# Patient Record
Sex: Male | Born: 1950 | Race: White | Hispanic: No | Smoking: Former smoker
Health system: Southern US, Community
[De-identification: ages and names within clinical notes are randomized; demographics above are authoritative.]

## PROBLEM LIST (undated history)

## (undated) DIAGNOSIS — K8043 Calculus of bile duct with acute cholecystitis with obstruction: Secondary | ICD-10-CM

## (undated) DIAGNOSIS — R030 Elevated blood-pressure reading, without diagnosis of hypertension: Principal | ICD-10-CM

## (undated) DIAGNOSIS — E785 Hyperlipidemia, unspecified: Secondary | ICD-10-CM

## (undated) DIAGNOSIS — R3129 Other microscopic hematuria: Secondary | ICD-10-CM

## (undated) DIAGNOSIS — M069 Rheumatoid arthritis, unspecified: Secondary | ICD-10-CM

## (undated) DIAGNOSIS — S62326A Displaced fracture of shaft of fifth metacarpal bone, right hand, initial encounter for closed fracture: Secondary | ICD-10-CM

## (undated) DIAGNOSIS — S92401A Displaced unspecified fracture of right great toe, initial encounter for closed fracture: Secondary | ICD-10-CM

## (undated) DIAGNOSIS — E611 Iron deficiency: Secondary | ICD-10-CM

## (undated) DIAGNOSIS — J449 Chronic obstructive pulmonary disease, unspecified: Secondary | ICD-10-CM

## (undated) DIAGNOSIS — S92501A Displaced unspecified fracture of right lesser toe(s), initial encounter for closed fracture: Secondary | ICD-10-CM

## (undated) DIAGNOSIS — J189 Pneumonia, unspecified organism: Secondary | ICD-10-CM

## (undated) DIAGNOSIS — E538 Deficiency of other specified B group vitamins: Secondary | ICD-10-CM

## (undated) DIAGNOSIS — Z87898 Personal history of other specified conditions: Secondary | ICD-10-CM

## (undated) DIAGNOSIS — K219 Gastro-esophageal reflux disease without esophagitis: Secondary | ICD-10-CM

## (undated) HISTORY — DX: Hyperlipidemia, unspecified: E78.5

## (undated) HISTORY — DX: Iron deficiency: E61.1

## (undated) HISTORY — DX: Elevated blood-pressure reading, without diagnosis of hypertension: R03.0

## (undated) HISTORY — DX: Other microscopic hematuria: R31.29

## (undated) HISTORY — DX: Deficiency of other specified B group vitamins: E53.8

## (undated) HISTORY — DX: Calculus of bile duct with acute cholecystitis with obstruction: K80.43

## (undated) HISTORY — DX: Gilbert syndrome: E80.4

## (undated) HISTORY — DX: Personal history of other specified conditions: Z87.898

## (undated) HISTORY — DX: Chronic obstructive pulmonary disease, unspecified: J44.9

## (undated) HISTORY — PX: INGUINAL HERNIA REPAIR: SHX194

## (undated) HISTORY — DX: Rheumatoid arthritis, unspecified: M06.9

## (undated) HISTORY — DX: Gastro-esophageal reflux disease without esophagitis: K21.9

## (undated) HISTORY — DX: Displaced fracture of shaft of fifth metacarpal bone, right hand, initial encounter for closed fracture: S62.326A

## (undated) HISTORY — DX: Displaced unspecified fracture of right great toe, initial encounter for closed fracture: S92.401A

---

## 2004-12-12 ENCOUNTER — Emergency Department (HOSPITAL_COMMUNITY): Admission: EM | Admit: 2004-12-12 | Discharge: 2004-12-12 | Payer: Self-pay | Admitting: Emergency Medicine

## 2004-12-16 DIAGNOSIS — Z87898 Personal history of other specified conditions: Secondary | ICD-10-CM

## 2004-12-16 HISTORY — DX: Personal history of other specified conditions: Z87.898

## 2005-02-23 ENCOUNTER — Ambulatory Visit: Payer: Self-pay | Admitting: Internal Medicine

## 2005-05-25 ENCOUNTER — Ambulatory Visit: Payer: Self-pay | Admitting: Internal Medicine

## 2005-06-04 ENCOUNTER — Ambulatory Visit: Payer: Self-pay | Admitting: Internal Medicine

## 2005-11-23 ENCOUNTER — Ambulatory Visit: Payer: Self-pay | Admitting: Internal Medicine

## 2006-05-17 ENCOUNTER — Ambulatory Visit: Payer: Self-pay | Admitting: Internal Medicine

## 2006-05-17 LAB — CONVERTED CEMR LAB
AST: 20 units/L (ref 0–37)
Alkaline Phosphatase: 75 units/L (ref 39–117)
Basophils Absolute: 0 10*3/uL (ref 0.0–0.1)
CO2: 33 meq/L — ABNORMAL HIGH (ref 19–32)
Calcium: 9.2 mg/dL (ref 8.4–10.5)
Chloride: 105 meq/L (ref 96–112)
Chol/HDL Ratio, serum: 6.3
Creatinine, Ser: 1 mg/dL (ref 0.4–1.5)
Eosinophil percent: 2.7 % (ref 0.0–5.0)
HCT: 48 % (ref 39.0–52.0)
LDL DIRECT: 191.5 mg/dL
Lymphocytes Relative: 23.5 % (ref 12.0–46.0)
MCV: 88 fL (ref 78.0–100.0)
PSA: 0.91 ng/mL (ref 0.10–4.00)
Platelets: 188 10*3/uL (ref 150–400)
RBC: 5.45 M/uL (ref 4.22–5.81)
RDW: 11.6 % (ref 11.5–14.6)
Sodium: 144 meq/L (ref 135–145)

## 2006-05-24 ENCOUNTER — Ambulatory Visit: Payer: Self-pay | Admitting: Internal Medicine

## 2007-05-08 ENCOUNTER — Ambulatory Visit: Payer: Self-pay | Admitting: Internal Medicine

## 2007-05-08 LAB — CONVERTED CEMR LAB
ALT: 36 units/L (ref 0–53)
Albumin: 3.9 g/dL (ref 3.5–5.2)
BUN: 9 mg/dL (ref 6–23)
Basophils Relative: 0 % (ref 0.0–1.0)
CO2: 31 meq/L (ref 19–32)
Chloride: 106 meq/L (ref 96–112)
Cholesterol: 161 mg/dL (ref 0–200)
Eosinophils Relative: 2 % (ref 0.0–5.0)
GFR calc non Af Amer: 83 mL/min
HCT: 46.4 % (ref 39.0–52.0)
HDL: 32 mg/dL — ABNORMAL LOW (ref >39.0)
LDL Cholesterol: 111 mg/dL — ABNORMAL HIGH (ref 0–99)
Monocytes Relative: 7 % (ref 3.0–11.0)
Neutro Abs: 3.5 10*3/uL (ref 1.4–7.7)
Neutrophils Relative %: 65.6 % (ref 43.0–77.0)
PSA: 0.68 ng/mL (ref 0.10–4.00)
Potassium: 4.1 meq/L (ref 3.5–5.1)
RBC: 5.33 M/uL (ref 4.22–5.81)
Total Bilirubin: 1.2 mg/dL (ref 0.3–1.2)
Total Protein: 6.1 g/dL (ref 6.0–8.3)

## 2007-05-23 DIAGNOSIS — E785 Hyperlipidemia, unspecified: Secondary | ICD-10-CM | POA: Insufficient documentation

## 2007-05-29 ENCOUNTER — Ambulatory Visit: Payer: Self-pay | Admitting: Internal Medicine

## 2007-05-29 DIAGNOSIS — K219 Gastro-esophageal reflux disease without esophagitis: Secondary | ICD-10-CM | POA: Insufficient documentation

## 2007-12-30 ENCOUNTER — Ambulatory Visit: Payer: Self-pay | Admitting: Internal Medicine

## 2010-09-26 ENCOUNTER — Encounter: Payer: Self-pay | Admitting: Internal Medicine

## 2010-09-26 ENCOUNTER — Encounter: Payer: Self-pay | Admitting: Family

## 2010-09-26 ENCOUNTER — Ambulatory Visit (INDEPENDENT_AMBULATORY_CARE_PROVIDER_SITE_OTHER): Payer: BC Managed Care – PPO | Admitting: Family

## 2010-09-26 VITALS — BP 110/64 | HR 108 | Temp 99.0°F | Resp 18 | Ht 71.0 in | Wt 148.1 lb

## 2010-09-26 DIAGNOSIS — J329 Chronic sinusitis, unspecified: Secondary | ICD-10-CM

## 2010-09-26 MED ORDER — CEFUROXIME AXETIL 500 MG PO TABS: 500.0000 mg | ORAL_TABLET | Freq: Two times a day (BID) | ORAL | Status: AC

## 2010-09-26 MED ORDER — BENZONATATE 100 MG PO CAPS: 100.0000 mg | ORAL_CAPSULE | Freq: Four times a day (QID) | ORAL | Status: DC | PRN

## 2010-09-26 NOTE — Assessment & Plan Note (Addendum)
60 yr old male with sinusitis and probable bronchitis.  Will treat with flonase, ceftin and will change allegra D to plain Zyrtec due to mild tachycardia.  Pt instructed to call if symptoms worsen or do not improve.

## 2010-09-26 NOTE — Progress Notes (Signed)
  Subjective:    Patient ID: George Marshall, male    DOB: 1950/07/25, 60 y.o.   MRN: [id number]  HPI  George Marshall is a 60 year old male who presents today with chief complaint of congestion.  Symptoms started Friday night.  He went yesterday to [facility] and was told to take claritin D and flonase which have not helped him.  +nasal congestion which is clear.+ "pain behind my eyes."  Has not tried any otc neds except claritin and nyquil.  Difficulty sleeping due to chills/sweats.  +hoarsenss.  +productive cough- clear sputum.  Has not taken temp at home, but has felt feverish.  Denies sick contacts.  Non-smoker.      Review of Systems See HPI  Past Medical History  Diagnosis Date  . Hyperlipidemia   . GERD (gastroesophageal reflux disease)   . History of syncope 12/2004    History   Social History  . Marital Status: Divorced    Spouse Name: N/A    Number of Children: N/A  . Years of Education: N/A   Occupational History  . Not on file.   Social History Main Topics  . Smoking status: Former Smoker    Types: Cigarettes    Quit date: 06/18/2000  . Smokeless tobacco: Current User    Types: Chew  . Alcohol Use: Not on file  . Drug Use: Not on file  . Sexually Active: Not on file   Other Topics Concern  . Not on file   Social History Narrative  . No narrative on file    Past Surgical History  Procedure Date  . Inguinal hernia repair     age 89    Family History  Problem Relation Age of Onset  . Hypertension      parents late 42's    No Known Allergies   No current outpatient prescriptions on file prior to visit.    BP 110/64  Pulse 108  Temp(Src) 99 F (37.2 C) (Oral)  Resp 18  Ht 5\' 11"  (1.803 m)  Wt 148 lb 1.3 oz (67.169 kg)  BMI 20.65 kg/m2    Physical Exam  Constitutional: He appears well-developed and well-nourished.  HENT:  Head: Normocephalic and atraumatic.  Mouth/Throat: Oropharynx is clear and moist.       Bilateral cerumen impaction.     Eyes: Pupils are equal, round, and reactive to light.  Neck: Neck supple.  Cardiovascular: Regular rhythm.        Slightly increased heart rate.  Pulmonary/Chest: Effort normal and breath sounds normal.            Assessment & Plan:  .

## 2010-09-26 NOTE — Patient Instructions (Addendum)
Call if you develop fever over 101, if symptoms worsen, or if they are not improved in 48-72 hours.

## 2010-10-04 ENCOUNTER — Encounter: Payer: Self-pay | Admitting: Family

## 2010-10-17 ENCOUNTER — Emergency Department (INDEPENDENT_AMBULATORY_CARE_PROVIDER_SITE_OTHER): Payer: BC Managed Care – PPO

## 2010-10-17 ENCOUNTER — Encounter: Payer: Self-pay | Admitting: Family

## 2010-10-17 ENCOUNTER — Emergency Department (HOSPITAL_BASED_OUTPATIENT_CLINIC_OR_DEPARTMENT_OTHER)
Admission: EM | Admit: 2010-10-17 | Discharge: 2010-10-17 | Disposition: A | Discharge status: Home / Self Care | Payer: BC Managed Care – PPO | Attending: Emergency Medicine | Admitting: Emergency Medicine

## 2010-10-17 ENCOUNTER — Ambulatory Visit (INDEPENDENT_AMBULATORY_CARE_PROVIDER_SITE_OTHER): Payer: BC Managed Care – PPO | Admitting: Family

## 2010-10-17 VITALS — BP 92/62 | HR 118 | Temp 98.2°F | Resp 24 | Ht 71.0 in | Wt 136.1 lb

## 2010-10-17 DIAGNOSIS — J189 Pneumonia, unspecified organism: Secondary | ICD-10-CM

## 2010-10-17 DIAGNOSIS — E860 Dehydration: Secondary | ICD-10-CM

## 2010-10-17 DIAGNOSIS — R5381 Other malaise: Secondary | ICD-10-CM | POA: Insufficient documentation

## 2010-10-17 DIAGNOSIS — R634 Abnormal weight loss: Secondary | ICD-10-CM

## 2010-10-17 DIAGNOSIS — Z87891 Personal history of nicotine dependence: Secondary | ICD-10-CM

## 2010-10-17 DIAGNOSIS — R5383 Other fatigue: Secondary | ICD-10-CM | POA: Insufficient documentation

## 2010-10-17 DIAGNOSIS — K219 Gastro-esophageal reflux disease without esophagitis: Secondary | ICD-10-CM | POA: Insufficient documentation

## 2010-10-17 DIAGNOSIS — E785 Hyperlipidemia, unspecified: Secondary | ICD-10-CM | POA: Insufficient documentation

## 2010-10-17 DIAGNOSIS — M069 Rheumatoid arthritis, unspecified: Secondary | ICD-10-CM

## 2010-10-17 HISTORY — DX: Rheumatoid arthritis, unspecified: M06.9

## 2010-10-17 LAB — COMPREHENSIVE METABOLIC PANEL
ALT: 11 U/L (ref 0–53)
BUN: 12 mg/dL (ref 6–23)
CO2: 28 mEq/L (ref 19–32)
Calcium: 8.3 mg/dL — ABNORMAL LOW (ref 8.4–10.5)
Chloride: 97 mEq/L (ref 96–112)
Creatinine, Ser: 1 mg/dL (ref 0.4–1.5)
GFR calc Af Amer: >60 mL/min (ref >60)
Potassium: 3.5 mEq/L (ref 3.5–5.1)
Sodium: 138 mEq/L (ref 135–145)
Total Bilirubin: 1.7 mg/dL — ABNORMAL HIGH (ref 0.3–1.2)

## 2010-10-17 LAB — DIFFERENTIAL
Monocytes Absolute: 0.6 10*3/uL (ref 0.1–1.0)
Neutro Abs: 1.5 10*3/uL — ABNORMAL LOW (ref 1.7–7.7)

## 2010-10-17 LAB — CBC
HCT: 37.6 % — ABNORMAL LOW (ref 39.0–52.0)
MCV: 79.3 fL (ref 78.0–100.0)
RBC: 4.74 MIL/uL (ref 4.22–5.81)

## 2010-10-17 LAB — URINALYSIS, ROUTINE W REFLEX MICROSCOPIC
Bilirubin Urine: NEGATIVE
Glucose, UA: NEGATIVE mg/dL
Ketones, ur: 15 mg/dL — AB
Nitrite: NEGATIVE
Protein, ur: NEGATIVE mg/dL

## 2010-10-17 LAB — LIPASE, BLOOD: Lipase: 64 U/L (ref 23–300)

## 2010-10-17 MED ORDER — IOHEXOL 350 MG/ML SOLN
80.0000 mL | Freq: Once | INTRAVENOUS | Status: AC | PRN
  Administered 2010-10-17: 80 mL via INTRAVENOUS

## 2010-10-17 NOTE — Assessment & Plan Note (Signed)
Clinically dehydrated, ill appearing male with progressive weight loss and anorexia.  I am concerned for underlying malignancy given his smoking history.  He presents today alone, and is tachycardic and hypotensive.  Report provided to the charge nurse in the [facility] ED.  Patient will need basic laboratories, IV hydration and further evaluation as inpatient for suspected underlying malignancy.

## 2010-10-17 NOTE — Patient Instructions (Addendum)
Please go directly to the emergency room on the first floor.

## 2010-10-17 NOTE — Progress Notes (Signed)
Subjective:    Patient ID: George Marshall, male    DOB: 1950-10-28, 60 y.o.   MRN: [id number]  HPI  Mr.  Corpus is a 60 year old male who presents today for follow up. He was treated on 4/10 for  sinusitis and probable bronchitis with flonase, ceftin and zyrtec. He presents today with chief complaint of anorexia, weight loss and poor energy. He has lost 12 pounds in the last 3 weeks. He notes that he has been unable to eat or drink very much.  He notes + chills, fever a few days ago. + continued sinus congestion- blowing clear to white nasal discharge.  He notes mild cough, some chest congestion.  Notes some pain in his back near the scapula. Finished antibiotic- felt better initially though he did not get back to normal.   Review of Systems  Constitutional: Positive for fever, fatigue and unexpected weight change.  Respiratory: Positive for cough.   Cardiovascular: Negative for chest pain and leg swelling.  Hematological: Negative for adenopathy.   Past Medical History  Diagnosis Date  . Hyperlipidemia   . GERD (gastroesophageal reflux disease)   . History of syncope 12/2004    History   Social History  . Marital Status: Divorced    Spouse Name: N/A    Number of Children: N/A  . Years of Education: N/A   Occupational History  . Not on file.   Social History Main Topics  . Smoking status: Former Smoker    Types: Cigarettes    Quit date: 06/18/2000  . Smokeless tobacco: Current User    Types: Chew  . Alcohol Use: Not on file  . Drug Use: Not on file  . Sexually Active: Not on file   Other Topics Concern  . Not on file   Social History Narrative  . No narrative on file    Past Surgical History  Procedure Date  . Inguinal hernia repair     age 46    Family History  Problem Relation Age of Onset  . Hypertension      parents late 22's    No Known Allergies  Current Outpatient Prescriptions on File Prior to Visit  Medication Sig Dispense Refill  .  benzonatate (TESSALON PERLES) 100 MG capsule Take 1 capsule (100 mg total) by mouth every 6 (six) hours as needed for cough.  30 capsule  0  . fluticasone (FLONASE) 50 MCG/ACT nasal spray 2 sprays by Each Nare route daily.        . tadalafil (CIALIS) 20 MG tablet Take 20 mg by mouth at bedtime as needed.        . cetirizine (ZYRTEC) 10 MG tablet Take 10 mg by mouth daily.        . niacin (NIASPAN) 500 MG CR tablet Take 500 mg by mouth at bedtime.        . simvastatin (ZOCOR) 40 MG tablet Take 40 mg by mouth at bedtime.          BP 92/62  Pulse 118  Temp(Src) 98.2 F (36.8 C) (Oral)  Resp 24  Ht 5\' 11"  (1.803 m)  Wt 136 lb 1.9 oz (61.744 kg)  BMI 18.99 kg/m2  SpO2 100%       Objective:   Physical Exam  Constitutional:       Thin weak appearing white male seated on exam table.    HENT:  Head: Normocephalic and atraumatic.  Eyes: Conjunctivae are normal.  Neck: Neck supple.  Cardiovascular: Regular rhythm.  Tachycardia present.   Pulmonary/Chest:       Mild increased work of breathing is noted.  Diminished breath sounds throughout.  Abdominal: Soft. There is no tenderness.          Assessment & Plan:

## 2010-10-18 ENCOUNTER — Telehealth: Payer: Self-pay | Admitting: Family

## 2010-10-18 NOTE — Telephone Encounter (Signed)
Left message on home phone requesting call back to see how he is feeling.  Called work number- they told me that the patient no longer words there.

## 2010-10-19 NOTE — Telephone Encounter (Signed)
Pt returned my call and states he feels much better today. Advised pt he needed to see his PCP or [place] on Friday for further monitoring. Pt states he would like to return to our facility. Appt. Scheduled for 10/20/10 at 10:45.

## 2010-10-19 NOTE — Telephone Encounter (Signed)
Attempted to reach pt at home, no answer. Left message for pt to return my call.

## 2010-10-20 ENCOUNTER — Ambulatory Visit (INDEPENDENT_AMBULATORY_CARE_PROVIDER_SITE_OTHER): Payer: BC Managed Care – PPO | Admitting: Family

## 2010-10-20 ENCOUNTER — Emergency Department (HOSPITAL_BASED_OUTPATIENT_CLINIC_OR_DEPARTMENT_OTHER)
Admission: EM | Admit: 2010-10-20 | Discharge: 2010-10-20 | Disposition: A | Discharge status: Home / Self Care | Payer: BC Managed Care – PPO | Attending: Emergency Medicine | Admitting: Emergency Medicine

## 2010-10-20 ENCOUNTER — Encounter: Payer: Self-pay | Admitting: Family

## 2010-10-20 ENCOUNTER — Emergency Department (INDEPENDENT_AMBULATORY_CARE_PROVIDER_SITE_OTHER): Payer: BC Managed Care – PPO

## 2010-10-20 DIAGNOSIS — J189 Pneumonia, unspecified organism: Secondary | ICD-10-CM

## 2010-10-20 DIAGNOSIS — Z79899 Other long term (current) drug therapy: Secondary | ICD-10-CM | POA: Insufficient documentation

## 2010-10-20 DIAGNOSIS — E785 Hyperlipidemia, unspecified: Secondary | ICD-10-CM | POA: Insufficient documentation

## 2010-10-20 DIAGNOSIS — I959 Hypotension, unspecified: Secondary | ICD-10-CM | POA: Insufficient documentation

## 2010-10-20 DIAGNOSIS — E860 Dehydration: Secondary | ICD-10-CM

## 2010-10-20 DIAGNOSIS — E869 Volume depletion, unspecified: Secondary | ICD-10-CM | POA: Insufficient documentation

## 2010-10-20 DIAGNOSIS — K219 Gastro-esophageal reflux disease without esophagitis: Secondary | ICD-10-CM | POA: Insufficient documentation

## 2010-10-20 HISTORY — DX: Pneumonia, unspecified organism: J18.9

## 2010-10-20 LAB — CBC
MCHC: 34.5 g/dL (ref 30.0–36.0)
RBC: 4.78 MIL/uL (ref 4.22–5.81)
RDW: 13.7 % (ref 11.5–15.5)

## 2010-10-20 LAB — BASIC METABOLIC PANEL
BUN: 9 mg/dL (ref 6–23)
CO2: 28 mEq/L (ref 19–32)
Glucose, Bld: 110 mg/dL — ABNORMAL HIGH (ref 70–99)
Potassium: 3.2 mEq/L — ABNORMAL LOW (ref 3.5–5.1)

## 2010-10-20 LAB — DIFFERENTIAL
Monocytes Relative: 14 % — ABNORMAL HIGH (ref 3–12)
Neutrophils Relative %: 49 % (ref 43–77)

## 2010-10-20 NOTE — Progress Notes (Signed)
Subjective:    Patient ID: George Marshall, male    DOB: 1951-06-06, 60 y.o.   MRN: [id number]  HPI  Mr.  Dreibelbis is a 60 yr old male who presents today for ER follow up.  He was seen in the ED on Tuesday afternoon and diagnosed with pneumonia.  He initially underwent chest x-ray which was followed by a CT angio of the chest which noted multilobar right lung pneumonia with reactive right hilar and paratracheal lymphadenopathy. He was rehydrated and treated with avelox and ultimately discharged home with plans for close outpatient follow up. He reports that energy is "not very good."  Notes that he still has no appetite and has to "force feed myself."  He reports that he has been drinking lots of fluids.  Denies any fever at home.  Slight cough- dry.  Denies associated nausea, vomitting, or chest pain. + chest congestion and some associated chest tightness which "makes me want to cough. "    Review of Systems    see HPI  Past Medical History  Diagnosis Date  . Hyperlipidemia   . GERD (gastroesophageal reflux disease)   . History of syncope 12/2004    History   Social History  . Marital Status: Divorced    Spouse Name: N/A    Number of Children: N/A  . Years of Education: N/A   Occupational History  . Not on file.   Social History Main Topics  . Smoking status: Former Smoker    Types: Cigarettes    Quit date: 06/18/2000  . Smokeless tobacco: Current User    Types: Chew  . Alcohol Use: Not on file  . Drug Use: Not on file  . Sexually Active: Not on file   Other Topics Concern  . Not on file   Social History Narrative  . No narrative on file    Past Surgical History  Procedure Date  . Inguinal hernia repair     age 41    Family History  Problem Relation Age of Onset  . Hypertension      parents late 23's    No Known Allergies  Current Outpatient Prescriptions on File Prior to Visit  Medication Sig Dispense Refill  . fluticasone (FLONASE) 50 MCG/ACT nasal  spray 2 sprays by Each Nare route daily.        . tadalafil (CIALIS) 20 MG tablet Take 20 mg by mouth at bedtime as needed.        . niacin (NIASPAN) 500 MG CR tablet Take 500 mg by mouth at bedtime.        . simvastatin (ZOCOR) 40 MG tablet Take 40 mg by mouth at bedtime.        Marland Kitchen DISCONTD: benzonatate (TESSALON PERLES) 100 MG capsule Take 1 capsule (100 mg total) by mouth every 6 (six) hours as needed for cough.  30 capsule  0  . DISCONTD: cetirizine (ZYRTEC) 10 MG tablet Take 10 mg by mouth daily.          BP 86/64  Pulse 112  Temp(Src) 98 F (36.7 C) (Oral)  Resp 16  Ht 5\' 11"  (1.803 m)  Wt 135 lb 1.9 oz (61.29 kg)  BMI 18.85 kg/m2    Objective:   Physical Exam     genera:l frail appearing thin white male awake and alert he appears tired, but somewhat better than he appeared on Tuesday, May 1. ENT: Dry oral mucosa is noted, bilateral tympanic membranes partially occluded by cerumen Neck:  supple no JVD is noted. Cardiovascular: S1-S2, mild tachycardia is noted,regular rate. Respiratory: Patient continues to have diminished breath sounds throughout, no wheezes rales or rhonchi. No increased work of breathing. Abdomen soft nontender nondistended positive bowel sounds are noted Psychiatric patient is awake and alert and oriented x3.   Assessment & Plan:

## 2010-10-20 NOTE — Assessment & Plan Note (Signed)
60 year old male presents following 3 days of Avelox. Avelox treatment was preceded by a 10 day course of Ceftin for presumed bronchitis on 09/26/2010. He continues to have anorexia. Physical exam and vital signs indicate ongoing dehydration. In my opinion the patient needs inpatient treatment, however with his hypotension and lack of transportation will refer him through the emergency department for further evaluation, rather than directly admitting to hospital. I also continue to be concerned about possible underlying malignancy is contributing etiology to his pneumonia and pulmonary issues. Report was given to the charge nurse at the med center ED.

## 2010-10-20 NOTE — Assessment & Plan Note (Addendum)
BP Readings from Last 3 Encounters:  10/20/10 86/64  10/17/10 92/62  09/26/10 110/64   The patient's blood pressure today is even lower than it was on 10/17/2010 prior to his ER visit for IV hydration. Will refer back to ED for hydration and probable admission.

## 2010-10-24 ENCOUNTER — Telehealth: Payer: Self-pay | Admitting: Family

## 2010-10-24 NOTE — Telephone Encounter (Signed)
Called patient to follow up.  He reports that he is feeling better, but energy "still not back."  He was scheduled for a f/u apt on 5/11.

## 2010-10-26 ENCOUNTER — Encounter: Payer: Self-pay | Admitting: Family

## 2010-10-27 ENCOUNTER — Inpatient Hospital Stay (HOSPITAL_COMMUNITY): Payer: BC Managed Care – PPO

## 2010-10-27 ENCOUNTER — Ambulatory Visit (INDEPENDENT_AMBULATORY_CARE_PROVIDER_SITE_OTHER): Payer: BC Managed Care – PPO | Admitting: Family

## 2010-10-27 ENCOUNTER — Inpatient Hospital Stay (HOSPITAL_COMMUNITY)
Admission: AD | Admit: 2010-10-27 | Discharge: 2010-10-29 | DRG: 089 | Disposition: A | Discharge status: Home / Self Care | Payer: BC Managed Care – PPO | Source: Ambulatory Visit | Attending: Internal Medicine | Admitting: Internal Medicine

## 2010-10-27 ENCOUNTER — Encounter: Payer: Self-pay | Admitting: Family

## 2010-10-27 DIAGNOSIS — E871 Hypo-osmolality and hyponatremia: Secondary | ICD-10-CM | POA: Diagnosis present

## 2010-10-27 DIAGNOSIS — R630 Anorexia: Secondary | ICD-10-CM | POA: Diagnosis present

## 2010-10-27 DIAGNOSIS — I959 Hypotension, unspecified: Secondary | ICD-10-CM | POA: Diagnosis present

## 2010-10-27 DIAGNOSIS — D72819 Decreased white blood cell count, unspecified: Secondary | ICD-10-CM | POA: Diagnosis present

## 2010-10-27 DIAGNOSIS — E538 Deficiency of other specified B group vitamins: Secondary | ICD-10-CM | POA: Diagnosis present

## 2010-10-27 DIAGNOSIS — J189 Pneumonia, unspecified organism: Secondary | ICD-10-CM

## 2010-10-27 DIAGNOSIS — E860 Dehydration: Secondary | ICD-10-CM

## 2010-10-27 DIAGNOSIS — E876 Hypokalemia: Secondary | ICD-10-CM | POA: Diagnosis present

## 2010-10-27 LAB — URINALYSIS, ROUTINE W REFLEX MICROSCOPIC
Glucose, UA: NEGATIVE mg/dL
Ketones, ur: 15 mg/dL — AB
Specific Gravity, Urine: 1.019 (ref 1.005–1.030)
pH: 6 (ref 5.0–8.0)

## 2010-10-27 LAB — CBC
HCT: 34.3 % — ABNORMAL LOW (ref 39.0–52.0)
HCT: 31 % — ABNORMAL LOW (ref 39.0–52.0)
Hemoglobin: 10.4 g/dL — ABNORMAL LOW (ref 13.0–17.0)
MCV: 79.4 fL (ref 78.0–100.0)
MCV: 78.7 fL (ref 78.0–100.0)
Platelets: 188 10*3/uL (ref 150–400)
RBC: 3.94 MIL/uL — ABNORMAL LOW (ref 4.22–5.81)
RDW: 13.6 % (ref 11.5–15.5)
RDW: 13.6 % (ref 11.5–15.5)
WBC: 2.9 10*3/uL — ABNORMAL LOW (ref 4.0–10.5)
WBC: 2.9 10*3/uL — ABNORMAL LOW (ref 4.0–10.5)

## 2010-10-27 LAB — DIFFERENTIAL
Basophils Absolute: 0 10*3/uL (ref 0.0–0.1)
Eosinophils Relative: 0 % (ref 0–5)
Neutrophils Relative %: 46 % (ref 43–77)

## 2010-10-27 LAB — TSH: TSH: 0.534 u[IU]/mL (ref 0.350–4.500)

## 2010-10-27 LAB — VITAMIN B12: Vitamin B-12: 170 pg/mL — ABNORMAL LOW (ref 211–911)

## 2010-10-27 LAB — COMPREHENSIVE METABOLIC PANEL
AST: 42 U/L — ABNORMAL HIGH (ref 0–37)
Albumin: 2.2 g/dL — ABNORMAL LOW (ref 3.5–5.2)
Calcium: 8.6 mg/dL (ref 8.4–10.5)
Chloride: 91 mEq/L — ABNORMAL LOW (ref 96–112)
Potassium: 3.5 mEq/L (ref 3.5–5.1)
Total Bilirubin: 1.5 mg/dL — ABNORMAL HIGH (ref 0.3–1.2)

## 2010-10-27 LAB — IRON AND TIBC
Saturation Ratios: 15 % — ABNORMAL LOW (ref 20–55)
TIBC: 128 ug/dL — ABNORMAL LOW (ref 215–435)
UIBC: 109 ug/dL

## 2010-10-27 LAB — HEPATIC FUNCTION PANEL
ALT: 40 U/L (ref 0–53)
AST: 46 U/L — ABNORMAL HIGH (ref 0–37)
Albumin: 1.9 g/dL — ABNORMAL LOW (ref 3.5–5.2)
Alkaline Phosphatase: 185 U/L — ABNORMAL HIGH (ref 39–117)
Bilirubin, Direct: 0.4 mg/dL — ABNORMAL HIGH (ref 0.0–0.3)
Total Bilirubin: 1.1 mg/dL (ref 0.3–1.2)

## 2010-10-27 LAB — PROTIME-INR
INR: 1.2 (ref 0.00–1.49)
Prothrombin Time: 15.4 seconds — ABNORMAL HIGH (ref 11.6–15.2)

## 2010-10-27 LAB — LACTATE DEHYDROGENASE: LDH: 217 U/L (ref 94–250)

## 2010-10-27 NOTE — Assessment & Plan Note (Signed)
Pt with anorexia and dehydration in setting of pneumonia.  I have recommended inpatient evaluation at this point.

## 2010-10-27 NOTE — Patient Instructions (Signed)
Please go directly to [facility] Admissions department. Follow up after you are discharged from the hospital.

## 2010-10-27 NOTE — Progress Notes (Signed)
  Subjective:    Patient ID: George Marshall, male    DOB: [birth year], 60 y.o.   MRN: [id number]  HPI  [name] is a 60 yr old male who presents today for follow up of his pneumonia.  He was initially seen on May 1st and was sent to the ED with dehydration. He underwent a CTA at that time which noted a multilobar pneumonia with reactive right hilar and paratracheal LAD.  He was treated with Avelox IV fluids and sent home.  He returned to the office for f/u on 5/4 and was again noted to be hypotensive and dehydrated.  He was again referred to the ED. CXR performed at that time was unchanged.  He has two days left of avelox.  He has chief complaint of fatigue. Notes that he has some associated joint stiffness.  Denies fevers.  He does get some chills at night.  Drinking well, not eating   No appetite and no "taste buds".  Minimal cough, denies shortness of breath or chest pain.    Review of Systems  See HPI  Past Medical History  Diagnosis Date  . Hyperlipidemia   . GERD (gastroesophageal reflux disease)   . History of syncope 12/2004    History   Social History  . Marital Status: Divorced    Spouse Name: N/A    Number of Children: N/A  . Years of Education: N/A   Occupational History  . Not on file.   Social History Main Topics  . Smoking status: Former Smoker    Types: Cigarettes    Quit date: 06/18/2000  . Smokeless tobacco: Current User    Types: Chew  . Alcohol Use: Not on file  . Drug Use: Not on file  . Sexually Active: Not on file   Other Topics Concern  . Not on file   Social History Narrative  . No narrative on file    Past Surgical History  Procedure Date  . Inguinal hernia repair     age 31    Family History  Problem Relation Age of Onset  . Hypertension      parents late 41's  . Cancer Mother     breast and cervical    No Known Allergies  Current Outpatient Prescriptions on File Prior to Visit  Medication Sig Dispense Refill  . tadalafil  (CIALIS) 20 MG tablet Take 20 mg by mouth at bedtime as needed.        . fluticasone (FLONASE) 50 MCG/ACT nasal spray 2 sprays by Each Nare route daily.        . niacin (NIASPAN) 500 MG CR tablet Take 500 mg by mouth at bedtime.        . simvastatin (ZOCOR) 40 MG tablet Take 40 mg by mouth at bedtime.          BP 90/62  Pulse 127  Temp(Src) 98.3 F (36.8 C) (Oral)  Resp 18  Ht 5\' 11"  (1.803 m)  Wt 134 lb 1.3 oz (60.818 kg)  BMI 18.70 kg/m2  SpO2 98%        Objective:   Physical Exam Gen: cachectic appearing white male, appears weak and tired, but in NAD Head: [place]/AT CV:  S1/s2, +tachycardia Resp:  Bilateral expiratory wheeze is noted, no increased work of breathing. Ext: no peripheral edema Psych: A and O x 3, calm and pleasant.        Assessment & Plan:

## 2010-10-27 NOTE — Assessment & Plan Note (Addendum)
Clinically not improving, he is now wheezing and could benefit from the addition of steroids.  He continues Avelox- today is his 10th day of treatment and he is no complaining of arthralgias which could be a side effect.  I remain very concerned about the possibility of underlying malignancy as the primary issue in this patient.  Throughout this illness he has never presented with a fever or significant white count.  He will need a follow up chest CT and pulmonary consultation.  Report given to Dr. Venetia Constable- accepting physician at [facility].  Pt to be admitted for formal inpatient treatment and evaluation.

## 2010-10-28 ENCOUNTER — Inpatient Hospital Stay (HOSPITAL_COMMUNITY): Payer: BC Managed Care – PPO

## 2010-10-28 DIAGNOSIS — D649 Anemia, unspecified: Secondary | ICD-10-CM

## 2010-10-28 LAB — CBC
Hemoglobin: 9.4 g/dL — ABNORMAL LOW (ref 13.0–17.0)
MCH: 26.4 pg (ref 26.0–34.0)
MCHC: 33 g/dL (ref 30.0–36.0)
MCV: 80.1 fL (ref 78.0–100.0)
Platelets: 160 10*3/uL (ref 150–400)
RBC: 3.56 MIL/uL — ABNORMAL LOW (ref 4.22–5.81)

## 2010-10-28 LAB — HIV ANTIBODY (ROUTINE TESTING W REFLEX): HIV: NONREACTIVE

## 2010-10-28 LAB — POCT OCCULT BLOOD STOOL (DEVICE): Fecal Occult Bld: NEGATIVE

## 2010-10-28 LAB — BASIC METABOLIC PANEL
BUN: 10 mg/dL (ref 6–23)
CO2: 28 mEq/L (ref 19–32)
Calcium: 8 mg/dL — ABNORMAL LOW (ref 8.4–10.5)
Chloride: 103 mEq/L (ref 96–112)
Creatinine, Ser: 0.76 mg/dL (ref 0.4–1.5)
GFR calc Af Amer: >60 mL/min (ref >60)

## 2010-10-28 LAB — RETICULOCYTES: Retic Count, Absolute: 63.5 10*3/uL (ref 19.0–186.0)

## 2010-10-28 LAB — TSH: TSH: 0.958 u[IU]/mL (ref 0.350–4.500)

## 2010-10-28 LAB — RHEUMATOID FACTOR: Rhuematoid fact SerPl-aCnc: 213 IU/mL — ABNORMAL HIGH (ref <=14)

## 2010-10-28 MED ORDER — IOHEXOL 300 MG/ML  SOLN
100.0000 mL | Freq: Once | INTRAMUSCULAR | Status: AC | PRN
  Administered 2010-10-28: 100 mL via INTRAVENOUS

## 2010-10-29 LAB — DIFFERENTIAL
Basophils Absolute: 0 10*3/uL (ref 0.0–0.1)
Basophils Relative: 1 % (ref 0–1)
Eosinophils Absolute: 0 10*3/uL (ref 0.0–0.7)
Eosinophils Relative: 1 % (ref 0–5)
Monocytes Absolute: 0.4 10*3/uL (ref 0.1–1.0)
Monocytes Relative: 12 % (ref 3–12)

## 2010-10-29 LAB — LEGIONELLA ANTIGEN, URINE: Legionella Antigen, Urine: NEGATIVE

## 2010-10-29 LAB — URINE CULTURE

## 2010-10-29 LAB — CBC
Hemoglobin: 10.2 g/dL — ABNORMAL LOW (ref 13.0–17.0)
MCH: 26.7 pg (ref 26.0–34.0)
MCHC: 33.2 g/dL (ref 30.0–36.0)
RDW: 14 % (ref 11.5–15.5)

## 2010-10-29 LAB — HEPATITIS PANEL, ACUTE: Hep B C IgM: NEGATIVE

## 2010-10-29 NOTE — Discharge Summary (Signed)
NAME:  George, Marshall NO.:  [id number]  MEDICAL RECORD NO.:  [id number]           PATIENT TYPE:  I  LOCATION:  3020                         FACILITY:  [facility]  PHYSICIAN:  Conley Canal, MD      DATE OF BIRTH:  [birth year]  DATE OF ADMISSION:  10/27/2010 DATE OF DISCHARGE:  10/29/2010                        DISCHARGE SUMMARY - REFERRING   PRIMARY CARE PHYSICIAN:  George Craze, NP, with [facility].  CONSULTING PHYSICIANS:  George Marshall, M.D., FRCPC  DISCHARGE DIAGNOSES: 1. Multilobar community-acquired pneumonia failing outpatient     treatment. 2. Leukopenia, still being investigated possible causes including     acute reactionary leukopenia secondary to ongoing infection versus     other etiologies including rheumatoid arthritis versus bone marrow     deficiency. 3. Vitamin B12 deficiency with mixed picture of anemia. 4. Transaminitis.  PROCEDURES PERFORMED: 1. Chest x-ray on Oct 27, 2010, showed some improvement in right upper     lobe pneumonia. 2. CT of abdomen and pelvis on Oct 28, 2010, showed no acute findings     in the abdomen or pelvis, no evidence for neoplasm or     lymphadenopathy and it also showed lung bases which showed no     substantial change in the subpleural airspace disease in the right     lower lobe compared to recent CT of the chest on Oct 17, 2010.  HOSPITAL COURSE:  George Marshall is a very pleasant 60 year old male referred for direct admission by George Marshall, nurse practitioner, as he was failing outpatient treatment for pneumonia.  He had received more than 10 days of Avelox in the outpatient setting, but continued to have cough, poor appetite, and dehydration, hence referral for further management.  On the day of admission, his white count was noted to be 2.9, hemoglobin 11.1, hematocrit 34.3, MCV 79, and platelet count 188; and he was also hyponatremic, hypokalemic with sodium 131, potassium 3.5 with some  transaminitis.  Total bilirubin 1.5, alkaline phosphatase 201, AST 42, and ALT 43.  This prompted further workup including hepatitis panel which was normal as well as sedimentation rate which was 104.  The anemia prompted an anemia panel which showed ferritin of 740, folate 6.3, vitamin B12 of 1700.  Blood cultures were negative x2.  The patient was seen by Dr. Donnie Marshall for anemia, leukopenia.  His thought was this was most likely reactive leukopenia.  He also ordered tests including a GGT which was elevated at 101.  Peripheral smear showing polychromasia, reticulocyte count which was unremarkable.  The patient's rheumatoid factor was elevated at 213.  HIV antibody was negative.  Haptoglobin was slightly elevated at 238.  At the time of discharge, the patient still complained of generalized joint pain, which he said started the same time he was first diagnosed of pneumonia.  Given the elevated rheumatoid factor, this would raise concern for rheumatoid arthritis, although rheumatoid factor can be quite nonspecific, so confirmatory tests including anti-citrullinated antibodies were sent prior to discharge. The elevated sedimentation rate and ferritin could just be pointing to acute ongoing inflammation, but consideration should be given for hemochromatosis  if these remain elevated after treatment.  Regarding the pneumonia, the patient was initially placed on Zosyn and vancomycin, then switched to ceftriaxone and azithromycin.  He remained afebrile and prior to discharge his white count was 3.1 with a hemoglobin of 10.2 and hematocrit 30.7.  He will be discharged to complete 4 more days of Keflex.  I placed him on a short course of prednisone for possible rheumatoid arthritis.  If in fact he has rheumatoid arthritis, he will need referral to Rheumatology for further management.  I gave him [facility] for [facility] followup in case he will require bone marrow biopsy. He  was started on vitamin B12 injections to complete 5 more days at home and subsequently monthly intramuscular injections.  He is otherwise discharged in a stable condition.  The time spent for this discharge preparation is less than 30 minutes.     Conley Canal, MD     SR/MEDQ  D:  10/29/2010  T:  10/29/2010  Job:  [id number]  cc:   George Craze, NP George Marshall, M.D., F.R.C.P.C.  Electronically Signed by Conley Canal  on 10/29/2010 10:49:58 AM

## 2010-10-30 ENCOUNTER — Telehealth: Payer: Self-pay | Admitting: *Deleted

## 2010-10-30 ENCOUNTER — Ambulatory Visit (INDEPENDENT_AMBULATORY_CARE_PROVIDER_SITE_OTHER): Payer: BC Managed Care – PPO | Admitting: Family

## 2010-10-30 DIAGNOSIS — E538 Deficiency of other specified B group vitamins: Secondary | ICD-10-CM

## 2010-10-30 LAB — CYCLIC CITRUL PEPTIDE ANTIBODY, IGG: Cyclic Citrullin Peptide Ab: >300 U/mL — ABNORMAL HIGH (ref 0.0–5.0)

## 2010-10-30 MED ORDER — CYANOCOBALAMIN 1000 MCG/ML IJ SOLN
1000.0000 ug | Freq: Once | INTRAMUSCULAR | Status: AC
  Administered 2010-10-30: 1000 ug via INTRAMUSCULAR

## 2010-10-30 NOTE — Telephone Encounter (Signed)
Pt presented to the office today for a B12 injection per hospital order at discharge. Pt wanted to know when he should f/u with [place] from his hospital stay and when should he get the next B12 inj. Per Sandford Craze, NP pt should f/u with [place] in 1 wk and next B12 inj should be in 1 month; will decide at that time how long injections should continue. Left message on machine for pt to return my call.

## 2010-11-01 NOTE — Telephone Encounter (Signed)
Patient came in and I scheduled him for his follow up and b 12

## 2010-11-02 ENCOUNTER — Encounter: Payer: Self-pay | Admitting: Family

## 2010-11-02 LAB — CULTURE, BLOOD (ROUTINE X 2)
Culture  Setup Time: 201205111831
Culture: NO GROWTH
Culture: NO GROWTH

## 2010-11-03 NOTE — Assessment & Plan Note (Signed)
[facility]                            [facility] OFFICE NOTE   NAME:George Marshall, George Marshall                      MRN:          [id number]  DATE:05/24/2006                            DOB:          08-04-1950    This is a 60 year old gentleman seen today for a wellness exam. He has  hypercholesterolemia. He is doing quite well. He has stopped both his  niacin and Lipitor a couple of months ago. Screening laboratory data  revealed an LDL of 191 and a total cholesterol of 247. He has a history  of vasovagal syncope in the past. He stopped the medication due to some  episodes of palpitation and flushing.  Past medical history is otherwise  fairly unremarkable. In 2006, he had a negative Cardiolite stress test  and echocardiogram. He had a hernia repaired at age 62.   FAMILY HISTORY:  Unchanged. Both parents remain well.   PHYSICAL EXAMINATION:  GENERAL: Revealed a fair complected male in no  acute distress.  VITAL SIGNS: Blood pressure was normal.  HEENT: Clear.  NECK: No bruits.  CHEST: Clear.  CARDIOVASCULAR: Normal heart sounds, no murmurs.  ABDOMEN: Benign.  EXTERNAL GENITALIA: Normal.  RECTAL: Prostate minimally enlarged.  Stool heme negative.  EXTREMITIES: Negative, full peripheral pulses.   IMPRESSION:  Dyslipidemia.   DISPOSITION:  Statin therapy will be resumed. Compliance stressed.  Niacin will be discontinued. We will change to a 20 mg dose. We will  recheck in one year or P.R.N.     Gordy Savers, MD  Electronically Signed    PFK/MedQ  DD: 05/24/2006  DT: 05/25/2006  Job #: [id number]

## 2010-11-06 ENCOUNTER — Encounter: Payer: Self-pay | Admitting: Family

## 2010-11-06 ENCOUNTER — Ambulatory Visit (INDEPENDENT_AMBULATORY_CARE_PROVIDER_SITE_OTHER): Payer: BC Managed Care – PPO | Admitting: Family

## 2010-11-06 ENCOUNTER — Ambulatory Visit (HOSPITAL_BASED_OUTPATIENT_CLINIC_OR_DEPARTMENT_OTHER)
Admission: RE | Admit: 2010-11-06 | Discharge: 2010-11-06 | Disposition: A | Discharge status: Home / Self Care | Payer: BC Managed Care – PPO | Source: Ambulatory Visit | Attending: Family | Admitting: Family

## 2010-11-06 VITALS — BP 98/60 | HR 112 | Temp 97.8°F | Resp 20 | Wt 134.0 lb

## 2010-11-06 DIAGNOSIS — E611 Iron deficiency: Secondary | ICD-10-CM

## 2010-11-06 DIAGNOSIS — R059 Cough, unspecified: Secondary | ICD-10-CM

## 2010-11-06 DIAGNOSIS — J189 Pneumonia, unspecified organism: Secondary | ICD-10-CM

## 2010-11-06 DIAGNOSIS — R599 Enlarged lymph nodes, unspecified: Secondary | ICD-10-CM | POA: Insufficient documentation

## 2010-11-06 DIAGNOSIS — E538 Deficiency of other specified B group vitamins: Secondary | ICD-10-CM

## 2010-11-06 DIAGNOSIS — R05 Cough: Secondary | ICD-10-CM

## 2010-11-06 DIAGNOSIS — E785 Hyperlipidemia, unspecified: Secondary | ICD-10-CM

## 2010-11-06 DIAGNOSIS — D709 Neutropenia, unspecified: Secondary | ICD-10-CM

## 2010-11-06 MED ORDER — IOHEXOL 300 MG/ML  SOLN
80.0000 mL | Freq: Once | INTRAMUSCULAR | Status: AC | PRN
  Administered 2010-11-06: 80 mL via INTRAVENOUS

## 2010-11-06 NOTE — Progress Notes (Signed)
  Subjective:    Patient ID: George Marshall, male    DOB: [birth year], 60 y.o.   MRN: [id number]  HPI  Mr.  George Marshall is a 60 yr old male who presents today for follow up of his pneumonia.   PNA- Patient was hospitalized from 10/27/10 through 10/29/10. During this hospitalization he was treated with completed antibiotics, reports that his energy has returned and his appetite has returned.    Neutropenia/Anemia/B12 Deficiency-  He was referred to hematology and was evaluated by Dr. [name] with whom he has schedule an outpatient follow up.  He felt that his neutropenia was due to acute illness and recommended replacement therapy of his B12 level.  He was heme neg while in the hospital.   Review of Systems See HPI  Past Medical History  Diagnosis Date  . Hyperlipidemia   . GERD (gastroesophageal reflux disease)   . History of syncope 12/2004    History   Social History  . Marital Status: Divorced    Spouse Name: N/A    Number of Children: N/A  . Years of Education: N/A   Occupational History  . Not on file.   Social History Main Topics  . Smoking status: Former Smoker    Types: Cigarettes    Quit date: 06/18/2000  . Smokeless tobacco: Current User    Types: Chew  . Alcohol Use: Not on file  . Drug Use: Not on file  . Sexually Active: Not on file   Other Topics Concern  . Not on file   Social History Narrative  . No narrative on file    Past Surgical History  Procedure Date  . Inguinal hernia repair     age 17    Family History  Problem Relation Age of Onset  . Hypertension      parents late 39's  . Cancer Mother     breast and cervical    No Known Allergies  No current outpatient prescriptions on file prior to visit.    BP 98/60  Pulse 112  Temp(Src) 97.8 F (36.6 C) (Oral)  Resp 20  Wt 134 lb (60.782 kg)  SpO2 99%       Objective:   Physical Exam  Constitutional:       Thin white male, awake, alert and in NAD  HENT:  Head: Normocephalic and  atraumatic.  Cardiovascular: Regular rhythm.        Mild tachycardia  Pulmonary/Chest:       Decreased breath sounds throughout, but clear to auscultation.  No increased work of breathing.           Assessment & Plan:

## 2010-11-06 NOTE — Patient Instructions (Addendum)
Please complete your CT scan on the first floor and your lab work. Follow up in 3 months for an appointment- sooner if worsening cough, weight loss etc.  Please follow up monthly for b12 injections.

## 2010-11-08 ENCOUNTER — Encounter: Payer: Self-pay | Admitting: Family

## 2010-11-08 DIAGNOSIS — E611 Iron deficiency: Secondary | ICD-10-CM | POA: Insufficient documentation

## 2010-11-08 DIAGNOSIS — D709 Neutropenia, unspecified: Secondary | ICD-10-CM | POA: Insufficient documentation

## 2010-11-08 DIAGNOSIS — E538 Deficiency of other specified B group vitamins: Secondary | ICD-10-CM

## 2010-11-08 HISTORY — DX: Deficiency of other specified B group vitamins: E53.8

## 2010-11-08 NOTE — Assessment & Plan Note (Addendum)
Being managed by hematology

## 2010-11-08 NOTE — Assessment & Plan Note (Signed)
Monthly b12 shots initiated.

## 2010-11-08 NOTE — Assessment & Plan Note (Signed)
Clinically improving.  CT of the chest notes: resolving mediastinal and right hilar adenopathy and resolving right upper and right lower lobe pneumonia, without  complete resolution. Will refer to pulmonology for formal evaluation as pneumonia has been so slow to improve.

## 2010-11-08 NOTE — Assessment & Plan Note (Signed)
Add PO iron.

## 2010-11-10 ENCOUNTER — Ambulatory Visit (INDEPENDENT_AMBULATORY_CARE_PROVIDER_SITE_OTHER): Payer: BC Managed Care – PPO | Admitting: Family Medicine

## 2010-11-10 ENCOUNTER — Encounter: Payer: Self-pay | Admitting: Family Medicine

## 2010-11-10 VITALS — BP 113/63 | HR 104 | Temp 97.8°F | Ht 71.0 in | Wt 134.0 lb

## 2010-11-10 DIAGNOSIS — M130 Polyarthritis, unspecified: Secondary | ICD-10-CM

## 2010-11-10 LAB — CBC WITH DIFFERENTIAL/PLATELET
Basophils Relative: 3.1 % — ABNORMAL HIGH (ref 0.0–3.0)
Eosinophils Relative: 0.2 % (ref 0.0–5.0)
HCT: 35.5 % — ABNORMAL LOW (ref 39.0–52.0)
Hemoglobin: 12.1 g/dL — ABNORMAL LOW (ref 13.0–17.0)
Lymphs Abs: 0.5 10*3/uL — ABNORMAL LOW (ref 0.7–4.0)
MCV: 83.5 fl (ref 78.0–100.0)
Monocytes Absolute: 0.3 10*3/uL (ref 0.1–1.0)
Monocytes Relative: 12.5 % — ABNORMAL HIGH (ref 3.0–12.0)
Neutro Abs: 1.6 10*3/uL (ref 1.4–7.7)
WBC: 2.5 10*3/uL — ABNORMAL LOW (ref 4.5–10.5)

## 2010-11-10 LAB — COMPREHENSIVE METABOLIC PANEL
Alkaline Phosphatase: 202 U/L — ABNORMAL HIGH (ref 39–117)
CO2: 29 mEq/L (ref 19–32)
Creatinine, Ser: 0.8 mg/dL (ref 0.4–1.5)
GFR: 112.12 mL/min (ref >60.00)
Glucose, Bld: 100 mg/dL — ABNORMAL HIGH (ref 70–99)
Sodium: 139 mEq/L (ref 135–145)
Total Bilirubin: 2.9 mg/dL — ABNORMAL HIGH (ref 0.3–1.2)
Total Protein: 5.4 g/dL — ABNORMAL LOW (ref 6.0–8.3)

## 2010-11-10 MED ORDER — OXYCODONE-ACETAMINOPHEN 5-500 MG PO CAPS: ORAL_CAPSULE | ORAL | Status: DC

## 2010-11-10 NOTE — Progress Notes (Signed)
OFFICE VISIT  11/13/2010   CC:  Chief Complaint  Patient presents with  . Pain    joint pain, began 7 weeks ago with sinus infection and complications following     HPI:    Patient is a 60 y.o. Caucasian male who presents for joint pains. Pt reports a couple of weeks of gradually worsening pain in both wrists and both knees, with the most severe worsening taking place the last 2-3 days.  Reports 8/10 intensity, reduced to 6-7/10 with 2 alleve tabs.  Pain inhibiting sleep now, walks very stiff.  No focal weakness, no paresthesias, no fevers. Denies prior history of joint pain/swelling problems.  Currently denies any pain in his neck, low back, shoulders, elbows, fingers, hips, ankles, or feet.  Pain/stiffness is the worst after prolonged sitting or when waking up in a.m.  Gets a bit better with movement/activity.      He was hospitalized 10/27/10-10/29/10 for multilobar pneumonia---reports gradual worsening of a flu-like illness despite 6d of avelox.  He was admitted and reports significant improvement with IV abx and fluids (vanco and zosyn, then rocephin and zithromax).  He was d/c'd home on prednisone for some joint pains (but apparently no physical signs of inflammation present during the hospitalization) but says this didn't help any significant amount.  His d/c summary does not report the prednisone dose he was given.  Other dx in hosp: normocytic anemia (vit B12 low, chronic dz, haptoglobin elevated), neutropenia (hem/onc consult done in hosp---said likely reactive).  Past Medical History  Diagnosis Date  . Hyperlipidemia   . GERD (gastroesophageal reflux disease)   . History of syncope 12/2004  . Neutropenia 11/08/2010  . B12 deficiency 11/08/2010  . Iron deficiency 11/08/2010    Past Surgical History  Procedure Date  . Inguinal hernia repair     age 8    Outpatient Prescriptions Prior to Visit  Medication Sig Dispense Refill  . ferrous sulfate 325 (65 FE) MG tablet Take 325 mg  by mouth 3 (three) times daily with meals.        Marland Kitchen omeprazole (PRILOSEC) 20 MG capsule Take 20 mg by mouth daily.        . predniSONE (DELTASONE) 10 MG tablet Take 10 mg by mouth daily.          No Known Allergies  ROS Review of Systems  Constitutional: Positive for fatigue. Negative for fever, chills and appetite change.  HENT: Negative for ear pain, congestion, sore throat, mouth sores, neck stiffness and dental problem.   Eyes: Negative for discharge, redness and visual disturbance.  Respiratory: Negative for cough, chest tightness, shortness of breath and wheezing.   Cardiovascular: Negative for chest pain, palpitations and leg swelling.  Gastrointestinal: Negative for nausea, vomiting, abdominal pain, diarrhea and blood in stool.  Genitourinary: Negative for dysuria, urgency, frequency, hematuria, flank pain and difficulty urinating.  Musculoskeletal: Positive for joint swelling and arthralgias. Negative for myalgias and back pain.  Skin: Negative for pallor and rash.  Neurological: Negative for dizziness, speech difficulty, weakness and headaches.  Hematological: Negative for adenopathy. Does not bruise/bleed easily.  Psychiatric/Behavioral: Negative for confusion and sleep disturbance. The patient is not nervous/anxious.      PE: Blood pressure 113/63, pulse 104, temperature 97.8 F (36.6 C), temperature source Oral, height 5\' 11"  (1.803 m), weight 134 lb (60.782 kg), SpO2 99.00%. Gen: Alert, nontoxic appearing.  Patient is oriented to person, place, time, and situation. Eyes: no icteris, injection, or swelling.  PERRLA, EOMI. Mouth:  no focal lesions.  Mucosa pink/moist.  Throat without erythema, swelling, or exudate.  Lips normal. Neck: supple, ROM full.   No lymphadenopathy, thyromegaly, or mass. Chest: symmetric expansion, nonlabored respirations.  Clear and equal breath sounds in all lung fields.   CV: RRR, no m/r/g.  Peripheral pulses 2+ and symmetric. ABD: soft, NT,  ND, BS normal.  No hepatospenomegaly or mass.  No bruits. EXT: no clubbing, cyanosis, or edema.  Knees: bilateral warmth and diffuse tenderness (posteriorly>anteriorly), minimal effusion (R>L), without erythema.  No instability.  He can flex right knee just beyond 90 deg and extends it to about 160 deg.  Left knee flexion is to 40 deg or so and extension is to 160 deg. Wrists are warm and tender, painful ROM, limited ROM--esp in flexion/extension, R>L. Extremity strength and sensation intact.  LABS:  None  PROCEDURE: right knee diagnostic aspiration--used sterile technique, 18 gauge needle entered from lateral approach with patient supine.  Easy return of straw yellow, clear fluid, 60 cc total.  Patient tolerated procedure well, no immediate complications.  IMPRESSION AND PLAN:  Polyarthritis Suspect Rheumatoid arthritis, new onset.  Aspirated fluid from right knee today and sent for diagnostic testing.   In review of his hospital d/c summary, it appears that maybe some joint pain was occurring during hospitalization as well, because he did get multiple rheum tests done and these were HIGHLY suggestive of rheumatoid arthritis (mild neutropenia, normocytic anemia, highly positive rh factor, ESR > 100, and CCP was pending at the time of his d/c and this has now come back and is VERY high).  However, during the hospitalization he must not have had any true arthritis---this occurred after d/c and clinches the diagnosis. We'll see if his joint fluid analysis is supportive of inflammitory arthritis, and I'll also do gram stain and culture of it as well as crystal analysis.  Check CBC, CMET, and ESR today.  For completeness will also check parvo and lyme titers. I recommended alleve 2 tabs bid with food, and tylox 5/500, 1-2 q4-6h prn pain. If fluid returns confirming inflammitory arthritis then I'll get him on prednisone and we'll refer to rheum ASAP.  I'll see him for f/u early next  week.      FOLLOW UP: Return in about 4 days (around 11/14/2010) for arthritis.

## 2010-11-11 LAB — SYNOVIAL CELL COUNT + DIFF, W/ CRYSTALS
Crystals, Fluid: NONE SEEN
Eosinophils-Synovial: 0 % (ref 0–1)
Neutrophil, Synovial: 96 % — ABNORMAL HIGH (ref 0–25)

## 2010-11-13 NOTE — Assessment & Plan Note (Addendum)
Suspect Rheumatoid arthritis, new onset.  Aspirated fluid from right knee today and sent for diagnostic testing.   In review of his hospital d/c summary, it appears that maybe some joint pain was occurring during hospitalization as well, because he did get multiple rheum tests done and these were HIGHLY suggestive of rheumatoid arthritis (mild neutropenia, normocytic anemia, highly positive rh factor, ESR > 100, and CCP was pending at the time of his d/c and this has now come back and is VERY high).  However, during the hospitalization he must not have had any true arthritis---this occurred after d/c and clinches the diagnosis. We'll see if his joint fluid analysis is supportive of inflammitory arthritis, and I'll also do gram stain and culture of it as well as crystal analysis.  Check CBC, CMET, and ESR today.  For completeness will also check parvo and lyme titers. I recommended alleve 2 tabs bid with food, and tylox 5/500, 1-2 q4-6h prn pain. If fluid returns confirming inflammitory arthritis then I'll get him on prednisone and we'll refer to rheum ASAP.  I'll see him for f/u early next week.

## 2010-11-14 ENCOUNTER — Other Ambulatory Visit: Payer: Self-pay | Admitting: Family Medicine

## 2010-11-14 DIAGNOSIS — M069 Rheumatoid arthritis, unspecified: Secondary | ICD-10-CM

## 2010-11-14 LAB — BODY FLUID CULTURE
Gram Stain: NONE SEEN
Organism ID, Bacteria: NO GROWTH

## 2010-11-14 LAB — B. BURGDORFI ANTIBODIES: B burgdorferi Ab IgG+IgM: 0.17 {ISR}

## 2010-11-14 MED ORDER — PREDNISONE 20 MG PO TABS: ORAL_TABLET | ORAL | Status: DC

## 2010-11-15 ENCOUNTER — Encounter: Payer: Self-pay | Admitting: Family Medicine

## 2010-11-15 ENCOUNTER — Ambulatory Visit (INDEPENDENT_AMBULATORY_CARE_PROVIDER_SITE_OTHER): Payer: BC Managed Care – PPO | Admitting: Family Medicine

## 2010-11-15 DIAGNOSIS — M130 Polyarthritis, unspecified: Secondary | ICD-10-CM

## 2010-11-15 LAB — PARVOVIRUS B19 ANTIBODY, IGG AND IGM
Parovirus B19 IgG Abs: <0.9 Index (ref <0.9)
Parovirus B19 IgM Abs: <0.9 Index (ref <0.9)

## 2010-11-15 NOTE — Progress Notes (Signed)
OFFICE NOTE  11/15/2010  CC:  Chief Complaint  Patient presents with  . Arthritis    discuss recent labs     HPI:   Patient is a 60 y.o. Caucasian male who is here for f/u arthritis. Feeling MUCH better since yesterday morning--wrists and knees minimally stiff and painful at this time.  Got back on prednisone last night.  No pain meds needed yet today. Energy level back up, appetite and fluid intake great, urinating and having BMs normally.  No fevers, no chest pain, no SOB, no cough.  Has some nasal congestion.  No HAs, no abd pains, no hearing or vision complaints. We reviewed recent lab results and lab results from hospitalization recently: all supportive of new dx of rheumatoid arthritis. We are in the process of setting up rheum referral locally.  He has hem/onc f/u from hospital (neutropenia) and pulmonologist f/u from hospital (slow-to resolve multilobar pneumonia).  He is getting another vit B12 injection at the [facility] office sometime in the next week or so.  Pertinent PMH:  Polyarthritis Neutropenia Vit B 12 def Pneumonia GERD Hyperlipidemia  MEDS;   Outpatient Prescriptions Prior to Visit  Medication Sig Dispense Refill  . Naproxen Sodium (ALEVE) 220 MG CAPS Take by mouth as needed.        Marland Kitchen omeprazole (PRILOSEC) 20 MG capsule Take 20 mg by mouth daily.        Marland Kitchen oxyCODONE-acetaminophen (TYLOX) 5-500 MG per capsule 1-2 tabs po q6h as needed for pain  40 capsule  0  . predniSONE (DELTASONE) 20 MG tablet 2 tabs po qd x 10d, then 1 tab po qd x 10d, then 1/2 tab po qd x 10d  35 tablet  0  . ferrous sulfate 325 (65 FE) MG tablet Take 325 mg by mouth 3 (three) times daily with meals.          PE: Blood pressure 125/82, pulse 132, temperature 97.6 F (36.4 C), temperature source Oral, height 5\' 11"  (1.803 m), weight 134 lb (60.782 kg). Gen: Alert, well appearing.  Patient is oriented to person, place, time, and situation. Eyes anicteric. Chest: Regular, tachycardia, no murmur.   CTA bilat, nonlabored. Joints: mild warmth of both wrists and knees but no swelling, stiffness, erythema, or tenderness.  ROM intact in wrists and knees today. No stiffness or limitation in ROM of shoulders, hands, or feet.  Lab Results  Component Value Date   WBC 2.5* 11/10/2010   HGB 12.1* 11/10/2010   HCT 35.5* 11/10/2010   MCV 83.5 11/10/2010   PLT 168.0 11/10/2010     Chemistry      Component Value Date/Time   NA 139 11/10/2010 1031   K 3.6 11/10/2010 1031   CL 103 11/10/2010 1031   CO2 29 11/10/2010 1031   BUN 13 11/10/2010 1031   CREATININE 0.8 11/10/2010 1031      Component Value Date/Time   CALCIUM 7.9* 11/10/2010 1031   ALKPHOS 202* 11/10/2010 1031   AST 27 11/10/2010 1031   ALT 64* 11/10/2010 1031   BILITOT 2.9* 11/10/2010 1031     Lab Results  Component Value Date   ALT 64* 11/10/2010   AST 27 11/10/2010   ALKPHOS 202* 11/10/2010   BILITOT 2.9* 11/10/2010   Lab Results  Component Value Date   ANA NEGATIVE 10/27/2010   RF  Value: 213 (NOTE) Result repeated and verified. Result confirmed by automatic dilution.* 10/28/2010   Lab Results  Component Value Date   FERRITIN 740* 10/27/2010  Lab Results  Component Value Date   VITAMINB12 170* 10/27/2010   Lab Results  Component Value Date   IRON 19* 10/27/2010   TIBC 128* 10/27/2010   FERRITIN 740* 10/27/2010   CCP was >300 on 10/29/10.  IMPRESSION AND PLAN:  Polyarthritis Suspect rheumatoid arthritis based on very high Rh factor, CCP, and inflammitory arthritis confirmed by recent fluid analysis from right knee. Rheum referral in process, currently on prednisone taper and prn oxycodone and is feeling much improved. I believe his liver test abnormalities and his neutropenia are a result of his systemic inflammitory state.   He'll keep f/u with pulmonoligist and hematologist as scheduled.  I asked him to f/u with me in 6 wks.     FOLLOW UP:  Return in about 6 weeks (around 12/27/2010) for f/u rh arthritis.

## 2010-11-15 NOTE — Assessment & Plan Note (Signed)
Suspect rheumatoid arthritis based on very high Rh factor, CCP, and inflammitory arthritis confirmed by recent fluid analysis from right knee. Rheum referral in process, currently on prednisone taper and prn oxycodone and is feeling much improved. I believe his liver test abnormalities and his neutropenia are a result of his systemic inflammitory state.   He'll keep f/u with pulmonoligist and hematologist as scheduled.  I asked him to f/u with me in 6 wks.

## 2010-11-16 ENCOUNTER — Other Ambulatory Visit: Payer: Self-pay | Admitting: Oncology

## 2010-11-16 ENCOUNTER — Encounter (HOSPITAL_BASED_OUTPATIENT_CLINIC_OR_DEPARTMENT_OTHER): Payer: BC Managed Care – PPO | Admitting: Oncology

## 2010-11-16 DIAGNOSIS — D649 Anemia, unspecified: Secondary | ICD-10-CM

## 2010-11-16 DIAGNOSIS — D72810 Lymphocytopenia: Secondary | ICD-10-CM

## 2010-11-16 LAB — CBC & DIFF AND RETIC
EOS%: 0 % (ref 0.0–7.0)
Eosinophils Absolute: 0 10*3/uL (ref 0.0–0.5)
Immature Retic Fract: 5.4 % (ref 0.00–13.40)
MCV: 81.1 fL (ref 79.3–98.0)
MONO%: 14.8 % — ABNORMAL HIGH (ref 0.0–14.0)
NEUT#: 0.6 10*3/uL — ABNORMAL LOW (ref 1.5–6.5)
RBC: 4.23 10*6/uL (ref 4.20–5.82)
RDW: 15.8 % — ABNORMAL HIGH (ref 11.0–14.6)
Retic %: 2.24 % — ABNORMAL HIGH (ref 0.50–1.60)
Retic Ct Abs: 94.75 10*3/uL — ABNORMAL HIGH (ref 24.10–77.50)
WBC: 1.3 10*3/uL — ABNORMAL LOW (ref 4.0–10.3)
lymph#: 0.5 10*3/uL — ABNORMAL LOW (ref 0.9–3.3)

## 2010-11-16 LAB — COMPREHENSIVE METABOLIC PANEL
Alkaline Phosphatase: 239 U/L — ABNORMAL HIGH (ref 39–117)
Calcium: 8.5 mg/dL (ref 8.4–10.5)
Glucose, Bld: 254 mg/dL — ABNORMAL HIGH (ref 70–99)
Potassium: 4 mEq/L (ref 3.5–5.3)
Sodium: 139 mEq/L (ref 135–145)

## 2010-11-16 LAB — MORPHOLOGY

## 2010-11-16 LAB — CHCC SMEAR

## 2010-11-16 LAB — LACTATE DEHYDROGENASE: LDH: 170 U/L (ref 94–250)

## 2010-11-21 ENCOUNTER — Ambulatory Visit (INDEPENDENT_AMBULATORY_CARE_PROVIDER_SITE_OTHER): Payer: BC Managed Care – PPO | Admitting: Pulmonary Disease

## 2010-11-21 ENCOUNTER — Encounter: Payer: Self-pay | Admitting: Pulmonary Disease

## 2010-11-21 VITALS — BP 140/82 | HR 113 | Temp 98.3°F | Ht 71.0 in | Wt 133.0 lb

## 2010-11-21 DIAGNOSIS — J4489 Other specified chronic obstructive pulmonary disease: Secondary | ICD-10-CM

## 2010-11-21 DIAGNOSIS — J449 Chronic obstructive pulmonary disease, unspecified: Secondary | ICD-10-CM | POA: Insufficient documentation

## 2010-11-21 DIAGNOSIS — J189 Pneumonia, unspecified organism: Secondary | ICD-10-CM

## 2010-11-21 DIAGNOSIS — D709 Neutropenia, unspecified: Secondary | ICD-10-CM

## 2010-11-21 NOTE — Progress Notes (Incomplete Revision)
?  Subjective:  ? ? Patient ID: George Marshall, male    DOB: 1951-02-11, 60 y.o.   MRN: [id number] ? ?HPI ? ?PCP - [name] ' [place], [name] ?Heme - [name] ? ?59/M, ex smoker , quit '02 referred  for evaluation of pneumonia. ?Admitted 5/11-13/12 for RUL pneumonia, neutropenia >> diagnosed with RA, pos factor, high CCP ?Given prednisone by dr [name] for knee swelling, joint tapped ?Neutropenia/Anemia/B12 Deficiency was evaluated by dr [name] & bone marrow bx is scheduled for 11/23/10. ? ?Reviewed CT chest  from 5/1 & 5/21>. RUL segmental consolidation & RLL patchy infiltrate. ?WC 1.3 on 5/31 ?He lost 13 lbs during this ilness, but appetite has returned. ?No cough,fevers, chest pain, hemopysis. ?Spireomtry showed moderate airway obstruction with FEv1 56% ? ? ? ?Review of Systems  ?Constitutional: Negative for fever, appetite change and unexpected weight change.  ?HENT: Negative for ear pain, congestion, sore throat, rhinorrhea, sneezing, trouble swallowing, dental problem and postnasal drip.   ?Eyes: Negative for redness.  ?Respiratory: Negative for cough, shortness of breath and wheezing.   ?Cardiovascular: Negative for chest pain, palpitations and leg swelling.  ?Gastrointestinal: Negative for nausea, vomiting, abdominal pain and diarrhea.  ?Genitourinary: Negative for dysuria and urgency.  ?Musculoskeletal: Negative for joint swelling.  ?Skin: Negative for rash.  ?Neurological: Negative for syncope and headaches.  ?Hematological: Does not bruise/bleed easily.  ?Psychiatric/Behavioral: Negative for dysphoric mood. The patient is not nervous/anxious.   ? ? ?   ?Objective:  ? Physical Exam ?Gen. Pleasant, thin, in no distress, normal affect ?ENT - no lesions, no post nasal drip ?Neck: No JVD, no thyromegaly, no carotid bruits ?Lungs: no use of accessory muscles, no dullness to percussion, clear without rales or rhonchi  ?Cardiovascular: Rhythm regular, heart sounds  normal, no murmurs or gallops, no peripheral edema ? Abdomen: soft and non-tender, no hepatosplenomegaly, BS no

## 2010-11-21 NOTE — Assessment & Plan Note (Signed)
No meds requied for now,no dyspnea

## 2010-11-21 NOTE — Assessment & Plan Note (Signed)
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 Await rheum opinion & BM biopsy.

## 2010-11-21 NOTE — Patient Instructions (Addendum)
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

## 2010-11-21 NOTE — Assessment & Plan Note (Signed)
Resolving based on last imaging. Ins etting of neutropenia, expect infiltrates to take upto 3 months to completely resolve. Can be followed by serial CXR q month to resolution (unless clinical worsening)

## 2010-11-21 NOTE — Progress Notes (Addendum)
  Subjective:    Patient ID: George Marshall, male    DOB: 03-29-1951, 60 y.o.   MRN: [id number]  HPI  PCP - [name] ' [place], [name] Heme [name]  59/M, ex smoker , quit '02 referred  for evaluation of pneumonia. Admitted 5/11-13/12 for RUL pneumonia, neutropenia >> diagnosed with RA, pos factor, high CCP Given prednisone by dr [name] for knee swelling, joint tapped Neutropenia/Anemia/B12 Deficiency was evaluated by dr [name] & bone marrow bx is scheduled for 11/23/10.  Reviewed CT chest  from 5/1 & 5/21>. RUL segmental consolidation & RLL patchy infiltrate. WC 1.3 on 5/31 He lost 13 lbs during this ilness, but appetite has returned. No cough,fevers, chest pain, hemopysis. Spireomtry showed moderate airway obstruction with FEv1 56%    Review of Systems  Constitutional: Negative for fever, appetite change and unexpected weight change.  HENT: Negative for ear pain, congestion, sore throat, rhinorrhea, sneezing, trouble swallowing, dental problem and postnasal drip.   Eyes: Negative for redness.  Respiratory: Negative for cough, shortness of breath and wheezing.   Cardiovascular: Negative for chest pain, palpitations and leg swelling.  Gastrointestinal: Negative for nausea, vomiting, abdominal pain and diarrhea.  Genitourinary: Negative for dysuria and urgency.  Musculoskeletal: Negative for joint swelling.  Skin: Negative for rash.  Neurological: Negative for syncope and headaches.  Hematological: Does not bruise/bleed easily.  Psychiatric/Behavioral: Negative for dysphoric mood. The patient is not nervous/anxious.        Objective:   Physical Exam Gen. Pleasant, thin, in no distress, normal affect ENT - no lesions, no post nasal drip Neck: No JVD, no thyromegaly, no carotid bruits Lungs: no use of accessory muscles, no dullness to percussion, clear without rales or rhonchi  Cardiovascular: Rhythm regular, heart sounds  normal, no murmurs or gallops, no peripheral edema Abdomen:  soft and non-tender, no hepatosplenomegaly, BS normal. Musculoskeletal: No deformities, no cyanosis or clubbing Neuro:  alert, non focal        Assessment & Plan:

## 2010-11-22 ENCOUNTER — Encounter: Payer: Self-pay | Admitting: Family Medicine

## 2010-11-22 ENCOUNTER — Other Ambulatory Visit: Payer: Self-pay | Admitting: Family Medicine

## 2010-11-23 ENCOUNTER — Encounter (HOSPITAL_BASED_OUTPATIENT_CLINIC_OR_DEPARTMENT_OTHER): Payer: BC Managed Care – PPO | Admitting: Oncology

## 2010-11-23 ENCOUNTER — Other Ambulatory Visit (HOSPITAL_COMMUNITY)
Admission: RE | Admit: 2010-11-23 | Discharge: 2010-11-23 | Disposition: A | Discharge status: Home / Self Care | Payer: BC Managed Care – PPO | Source: Ambulatory Visit | Attending: Oncology | Admitting: Oncology

## 2010-11-23 ENCOUNTER — Other Ambulatory Visit: Payer: Self-pay | Admitting: Oncology

## 2010-11-23 DIAGNOSIS — D72810 Lymphocytopenia: Secondary | ICD-10-CM | POA: Insufficient documentation

## 2010-11-23 DIAGNOSIS — D649 Anemia, unspecified: Secondary | ICD-10-CM | POA: Insufficient documentation

## 2010-11-23 LAB — CBC WITH DIFFERENTIAL/PLATELET
Basophils Absolute: 0 10*3/uL (ref 0.0–0.1)
Eosinophils Absolute: 0 10*3/uL (ref 0.0–0.5)
HCT: 38.1 % — ABNORMAL LOW (ref 38.4–49.9)
HGB: 13 g/dL (ref 13.0–17.1)
NEUT#: 3.5 10*3/uL (ref 1.5–6.5)
NEUT%: 83.3 % — ABNORMAL HIGH (ref 39.0–75.0)
RDW: 17.8 % — ABNORMAL HIGH (ref 11.0–14.6)
lymph#: 0.4 10*3/uL — ABNORMAL LOW (ref 0.9–3.3)

## 2010-11-27 ENCOUNTER — Ambulatory Visit (INDEPENDENT_AMBULATORY_CARE_PROVIDER_SITE_OTHER): Payer: BC Managed Care – PPO | Admitting: Family Medicine

## 2010-11-27 DIAGNOSIS — E538 Deficiency of other specified B group vitamins: Secondary | ICD-10-CM

## 2010-11-27 MED ORDER — CYANOCOBALAMIN 1000 MCG/ML IJ SOLN
1000.0000 ug | Freq: Once | INTRAMUSCULAR | Status: AC
  Administered 2010-11-27: 1000 ug via INTRAMUSCULAR

## 2010-11-27 NOTE — Progress Notes (Signed)
  Subjective:    Patient ID: George Marshall, male    DOB: [birth year], 60 y.o.   MRN: [id number]  HPI    Review of Systems     Objective:   Physical Exam        Assessment & Plan:

## 2010-11-28 LAB — ANCA SCREEN W REFLEX TITER: Atypical p-ANCA Screen: POSITIVE — AB

## 2010-11-29 NOTE — Consult Note (Signed)
NAME:  George Marshall, George Marshall NO.:  [id number]  MEDICAL RECORD NO.:  [id number]           PATIENT TYPE:  I  LOCATION:  3020                         FACILITY:  [facility]  PHYSICIAN:  Pierce Crane, M.D., F.R.C.P.C.DATE OF BIRTH:  Apr 04, 1951  DATE OF CONSULTATION:  10/28/2010 DATE OF DISCHARGE:  10/29/2010                                CONSULTATION   REASON FOR CONSULTATION:  Neutropenia, anemia.  HISTORY OF PRESENT ILLNESS:  This is a 60 year old white male hospitalized on Oct 27, 2010, with community-acquired pneumonia, hypotension, and dehydration.  He was treated with Avelox as an outpatient, then changed to IV vancomycin and Zosyn here.  Admit labs revealed a white blood cell of 2.8, ANC 1.3, hemoglobin 11.1, hematocrit 34.4, and a platelet of 188.  We have been asked to see the patient in consult for abnormal hematology profile.  PROBLEM LIST: 1. Community-acquired pneumonia diagnosed on Oct 17, 2010, failed     outpatient treatment. 2. Remote tobacco. 3. GERD. 4. Hyperlipidemia. 5. Erectile dysfunction.  MEDICATION PRIOR TO ARRIVAL: 1. Flonase 50 mcg b.i.d. 2. Niacin 500 mg at bedtime. 3. Zocor 40 mg at bedtime. 4. Cialis 20 mg daily as needed.  ALLERGIES:  NKDA.  SOCIAL HISTORY:  Divorced, retired from Therapist, occupational of [employer].  Quit tobacco in 2001, prior heavy use (30+ years of 1-2 packs per day).  No illicit drug use or alcohol use.  Does drink caffeine.  FAMILY HISTORY:  Mother alive with breast and cervical cancer in remission.  Father deceased in his 50s with pneumonia.  REVIEW OF SYSTEMS:  The patient has lost 13 pounds in 3 weeks, loss of appetite.  No chest pain, shortness of breath, nausea, vomiting, or significant reflux symptoms.  No trouble swallowing.  No skin or musculoskeletal complaints.  No headaches, dizziness, melena, or hematuria.  PHYSICAL EXAMINATION:  GENERAL:  The patient is alert and oriented x3, in no acute  distress and thin. HEENT:  Normocephalic, atraumatic.  PERRL, EOMI.  Pharynx benign. NECK:  Supple without bruit or adenopathy. LUNGS:  With inspiratory wheeze.  Decreased air movement throughout. CARDIAC:  Regular rate and rhythm without murmurs, gallops, rubs.  No extremity edema. ABDOMEN:  Soft, flat, nontender, nondistended.  Normal bowel sounds x4 quadrants.  No HSM. EXTREMITIES:  Distal pulses intact. NEUROLOGIC:  Nonfocal.  LABORATORY DATA:  White blood cell 2.1, hemoglobin 9.4, hematocrit 28.5, platelet 160, ANC 1.3.  ESR 104, folate 6.3, iron 19, TIBC 128, percent sat 15, B12 of 170, TSH 0.53, ferritin 740.  Sodium 138, potassium 3.7, BUN 10, creatinine 0.76, glucose 107.  Magnesium 2.4.  Albumin in blood 1.9, bilirubin 1.7, alk phos 185, AST 46, ALT 40, LDH 217, lipase 64. UA reveals positive bilirubin and ketones with urobilinogen greater than 8 and specific gravity 1.042.  Chest CT on Oct 17, 2010, showed multilobar right lung pneumonia with reactive right hilar and paratracheal adenopathy.  CT of the abdomen and pelvis is pending at the time of dictation.  ASSESSMENT AND PLAN: 1. Neutropenia. 2. Anemia with low iron and B12, elevated ferritin. 3. Abnormal LFTs with increased urobilinogen. 4. Abnormal  chest x-ray with right upper lobe pneumonia and     adenopathy. 5. Weight loss, anorexia, and weakness.  Dr. [name] suspects a reactive     leukopenia.  He will review the patient's peripheral smear, but     suspect an ongoing inflammatory picture causing decreased white     blood cell and anemia with possible hemodilution.  It is felt     unlikely that this is an intrinsic marrow process.  We will check     retic count.  Because B12 levels are low, we would recommend     replacement therapy.  Thank you for allowing [place] to assist in the consultation and management of this patient.     Luan Moore, P.A.   ______________________________ Pierce Crane, M.D.,  F.R.C.P.C.    TCJ/MEDQ  D:  10/29/2010  T:  10/29/2010  Job:  [id number]  Electronically Signed by Delice Bison JERNEJCIC P.A. on 11/19/2010 07:52:46 AM Electronically Signed by Pierce Crane MD on 11/29/2010 12:26:35 PM

## 2010-12-01 ENCOUNTER — Other Ambulatory Visit: Payer: Self-pay | Admitting: Oncology

## 2010-12-01 ENCOUNTER — Encounter (HOSPITAL_BASED_OUTPATIENT_CLINIC_OR_DEPARTMENT_OTHER): Payer: BC Managed Care – PPO | Admitting: Oncology

## 2010-12-01 DIAGNOSIS — D72810 Lymphocytopenia: Secondary | ICD-10-CM

## 2010-12-01 LAB — CBC & DIFF AND RETIC
Basophils Absolute: 0 10*3/uL (ref 0.0–0.1)
Eosinophils Absolute: 0 10*3/uL (ref 0.0–0.5)
HCT: 39.6 % (ref 38.4–49.9)
HGB: 13 g/dL (ref 13.0–17.1)
Immature Retic Fract: 2 % (ref 0.00–13.40)
MCV: 81.3 fL (ref 79.3–98.0)
NEUT#: 4 10*3/uL (ref 1.5–6.5)
NEUT%: 86.7 % — ABNORMAL HIGH (ref 39.0–75.0)
RDW: 16 % — ABNORMAL HIGH (ref 11.0–14.6)
Retic Ct Abs: 64.77 10*3/uL (ref 24.10–77.50)
lymph#: 0.4 10*3/uL — ABNORMAL LOW (ref 0.9–3.3)

## 2010-12-01 LAB — COMPREHENSIVE METABOLIC PANEL
AST: 11 U/L (ref 0–37)
Alkaline Phosphatase: 124 U/L — ABNORMAL HIGH (ref 39–117)
BUN: 15 mg/dL (ref 6–23)
Creatinine, Ser: 0.68 mg/dL (ref 0.50–1.35)
Total Bilirubin: 0.6 mg/dL (ref 0.3–1.2)

## 2010-12-01 LAB — MORPHOLOGY: PLT EST: ADEQUATE

## 2010-12-19 ENCOUNTER — Ambulatory Visit: Payer: BC Managed Care – PPO | Admitting: Pulmonary Disease

## 2010-12-27 ENCOUNTER — Other Ambulatory Visit: Payer: Self-pay | Admitting: Family Medicine

## 2010-12-27 ENCOUNTER — Encounter: Payer: Self-pay | Admitting: Family Medicine

## 2010-12-27 ENCOUNTER — Ambulatory Visit (INDEPENDENT_AMBULATORY_CARE_PROVIDER_SITE_OTHER): Payer: BC Managed Care – PPO | Admitting: Family Medicine

## 2010-12-27 VITALS — BP 99/68 | HR 111 | Ht 71.0 in | Wt 133.0 lb

## 2010-12-27 DIAGNOSIS — R7309 Other abnormal glucose: Secondary | ICD-10-CM

## 2010-12-27 DIAGNOSIS — D709 Neutropenia, unspecified: Secondary | ICD-10-CM

## 2010-12-27 DIAGNOSIS — D649 Anemia, unspecified: Secondary | ICD-10-CM

## 2010-12-27 DIAGNOSIS — R739 Hyperglycemia, unspecified: Secondary | ICD-10-CM

## 2010-12-27 DIAGNOSIS — M069 Rheumatoid arthritis, unspecified: Secondary | ICD-10-CM

## 2010-12-27 DIAGNOSIS — J189 Pneumonia, unspecified organism: Secondary | ICD-10-CM

## 2010-12-27 LAB — BASIC METABOLIC PANEL
CO2: 29 mEq/L (ref 19–32)
Calcium: 9 mg/dL (ref 8.4–10.5)
Chloride: 97 mEq/L (ref 96–112)
Creat: 0.84 mg/dL (ref 0.50–1.35)
Sodium: 138 mEq/L (ref 135–145)

## 2010-12-27 LAB — HEMOGLOBIN A1C: Mean Plasma Glucose: 91 mg/dL (ref <117)

## 2010-12-27 MED ORDER — IBUPROFEN 600 MG PO TABS: ORAL_TABLET | ORAL | Status: DC

## 2010-12-27 MED ORDER — OXYCODONE-ACETAMINOPHEN 5-500 MG PO CAPS: ORAL_CAPSULE | ORAL | Status: DC

## 2010-12-27 NOTE — Assessment & Plan Note (Signed)
Hb normalized since 10/2010 with addition of oral iron. However, his anemia was normocytic and certainly multifactorial: anemia of chronic disease, iron def, vit b12 def. Will do f/u IBC and ferritin testing next f/u visit.  Will also do hemoccults at that time.

## 2010-12-27 NOTE — Assessment & Plan Note (Signed)
Cont. Methotrexate as per rheum.

## 2010-12-27 NOTE — Assessment & Plan Note (Signed)
Resolving. Keep pulm f/u.

## 2010-12-27 NOTE — Progress Notes (Signed)
OFFICE NOTE  12/27/2010  CC:  Chief Complaint  Patient presents with  . Arthritis     HPI:   Patient is a 60 y.o. Caucasian male who is here for f/u recent severe illness during which he was diagnosed with RA, neutropenia, and multilobar pneumonia.  He has seen Dr. Dierdre Forth, the rheumatologist, and has taken 3 weeks of methotrexate.  He has finished all systemic steroids.   He does have a re-emergence of pain and stiffness in both wrists and both knees--moderate, requiring 1-2 tylox per day.  Rheum did steroid injections in knees recently. Cough resolved for the most part.  No SOB.  No fever.  No CP.  Saw pulmonologist again and was reassured his pneumonia was clearing up.  Has appt for a f/u CXR 01/02/11.  Pertinent PMH:  RA, with neutropenia, transaminasemia, pneumonia. GERD Vit B12 def COPD Iron deficiency  MEDS;  Not on prednisone. Outpatient Prescriptions Prior to Visit  Medication Sig Dispense Refill  . ferrous sulfate 325 (65 FE) MG tablet Take 325 mg by mouth 3 (three) times daily with meals.        Marland Kitchen oxyCODONE-acetaminophen (TYLOX) 5-500 MG per capsule 1-2 tabs po q6h as needed for pain  40 capsule  0  . predniSONE (DELTASONE) 20 MG tablet 2 tabs po qd x 10d, then 1 tab po qd x 10d, then 1/2 tab po qd x 10d  35 tablet  0    PE: Blood pressure 99/68, pulse 111, height 5\' 11"  (1.803 m), weight 133 lb (60.328 kg). Gen: Alert, well appearing.  Patient is oriented to person, place, time, and situation. Wrists: both are warm, tender, and stiff. Knees: both are warm, tender, slight effusions, mild limitation in knee extension bilat, full flexion intact bilat.  RECENT LABS:     Chemistry      Component Value Date/Time   NA 140 12/01/2010 0814   K 3.9 12/01/2010 0814   CL 104 12/01/2010 0814   CO2 26 12/01/2010 0814   BUN 15 12/01/2010 0814   CREATININE 0.68 12/01/2010 0814      Component Value Date/Time   CALCIUM 8.7 12/01/2010 0814   ALKPHOS 124* 12/01/2010 0814   AST 11  12/01/2010 0814   ALT 21 12/01/2010 0814   BILITOT 0.6 12/01/2010 0814      IMPRESSION AND PLAN:  Rheumatoid arthritis Cont. Methotrexate as per rheum.   Neutropenia Resolved.  BM bx normal.   This was part of his RA.  Hyperglycemia Glucoses up on two bmets in the last 6 wks (198-250). Was on prednisone at the time. Will recheck gluc and check HbA1c.   Anemia Hb normalized since 10/2010 with addition of oral iron. However, his anemia was normocytic and certainly multifactorial: anemia of chronic disease, iron def, vit b12 def. Will do f/u IBC and ferritin testing next f/u visit.  Will also do hemoccults at that time.  Pneumonia Resolving. Keep pulm f/u.     FOLLOW UP:  Return in about 3 months (around 03/29/2011).

## 2010-12-27 NOTE — Assessment & Plan Note (Signed)
Resolved.  BM bx normal.   This was part of his RA.

## 2010-12-27 NOTE — Assessment & Plan Note (Signed)
Glucoses up on two bmets in the last 6 wks (198-250). Was on prednisone at the time. Will recheck gluc and check HbA1c.

## 2010-12-28 ENCOUNTER — Telehealth: Payer: Self-pay | Admitting: Family Medicine

## 2010-12-28 NOTE — Telephone Encounter (Signed)
Pls notify patient that his labs yesterday were normal: his sugar must have only been elevated briefly when he was on prednisone.  Reassure him that there is no sign that he has any ongoing problem with high sugars.  Thx--PM

## 2010-12-28 NOTE — Telephone Encounter (Signed)
I have attempted to contact this patient by phone with the following results: left message to return my call on answering machine (mobile).  

## 2010-12-28 NOTE — H&P (Signed)
NAME:  George Marshall, George Marshall NO.:  [id number]  MEDICAL RECORD NO.:  [id number]           PATIENT TYPE:  I  LOCATION:  3020                         FACILITY:  [facility]  PHYSICIAN:  Conley Canal, MD      DATE OF BIRTH:  [birth year]  DATE OF ADMISSION:  10/27/2010 DATE OF DISCHARGE:                             HISTORY & PHYSICAL   PRIMARY CARE PROVIDER:  Sandford Craze, nurse practitioner with [facility].  CHIEF COMPLAINT:  Weakness.  HISTORY OF PRESENT ILLNESS:  George Marshall is a 60 year old male with past medical history of hyperlipidemia, GERD, and remote tobacco abuse who was sent for direct admission by primary care provider.  The patient was initially seen by nurse practitioner on Oct 17, 2010, at which time he was sent to the Emergency Department.  At that time, the patient was administered IV fluids and prescribed Avelox for right-sided pneumonia and discharged home.  The patient was referred to the Emergency Department again on Oct 20, 2010, due to hypertension and dehydration noted in the office.  Upon evaluation in the Emergency Department, apparently the patient's chest x-ray was unchanged and the patient was again discharged home.  The patient presented to primary care physician today with worsening weakness.  While in the office, the patient was noted to be hypotensive with a systolic blood pressure of 90.  Primary care provider also noted some wheezing on exam, prompting request for direct admission.  The patient denies any recent fever, chest pain, or shortness of breath. He does report occasional productive cough.  He denies any abdominal pain, nausea, vomiting, or diarrhea.  The patient does also endorse some weight loss, approximately 13 pounds since his illness started at the beginning of May, however no unintentional weight loss prior to that time.  The patient is to be admitted at this time by triad hospitalist for further evaluation and  treatment.  PAST MEDICAL HISTORY: 1. Hyperlipidemia. 2. GERD. 3. History of syncope in July 2006.  MEDICATIONS: 1. Flonase 50 mcg nasal spray 2 sprays to each nostril daily. 2. Niacin 500 mg p.o. at bedtime. 3. Zocor 40 mg p.o. at bedtime. 4. Cialis 20 mg p.o. at bedtime p.r.n.  ALLERGIES:  No known drug allergies.  SOCIAL HISTORY:  The patient divorced.  He lives alone.  He is retired from [employer] of city of [place].  He does report heavy tobacco use abuse for several years, quitting in 2001.  He denies any EtOH or illegal drug use.  FAMILY HISTORY:  Mother is alive with history of breath and cervical cancer.  Father deceased in his 48s from complications with wrist fracture and pneumonia.  REVIEW OF SYSTEMS:  As stated in HPI, otherwise negative.  PHYSICAL EXAMINATION:  VITAL SIGNS:  Blood pressure 115/75, heart rate 116, respirations 19, temperature 97.3, and O2 sat is 95% on room air. GENERAL:  This is a thin, pale Caucasian male awake and alert in no acute distress.HEENT:  Head is normocephalic, atraumatic.  Eyes, extraocular movements are intact without scleral icterus or injection.  Ear, nose, and throat; mucous membranes are extremely dry.  Question of thrush on tongue. NECK:  Supple with no thyromegaly or lymphadenopathy.  No JVD or carotid bruits. CARDIOVASCULAR:  S1, S2.  Slightly increased rate with no murmur, rub, or gallop.  No lower extremity edema. RESPIRATORY:  No increased respiratory effort.  The patient does have some scatter rhonchi on the right side.  He also has very mild expiratory wheeze. GASTROINTESTINAL:  Abdomen is soft, nontender, nondistended with positive bowel sounds.  No appreciated masses or hepatosplenomegaly. NEUROLOGIC:  The patient is able to move all extremities x4 without motor or sensory deficit on exam. PSYCHOLOGIC:  The patient is alert and oriented x4 with very pleasant mood and affect.  PERTINENT  LABORATORY AND ANCILLARY STUDIES:  At this time, only a CBC is available showing white count of 2.9, platelet count 188, hemoglobin 11.1, and hematocrit 34.3.  INR is 1.20.  Other admission labs including C-MET, blood cultures x2, LDH, and urinalysis are all pending in addition to repeat chest x-ray  Chest x-ray performed on Oct 20, 2010, does show a right upper lobe pneumonia.  CT angio of the chest performed on Oct 17, 2010, with no evidence of pulmonary emboli.  However, does reveal multi lobar right pneumonia with reactive lymphadenopathy.  ASSESSMENT/PLAN: 1. Community-acquired pneumonia.  Given that the patient has failed     outpatient treatment with empiric Avelox, we will start treatment     with IV vancomycin and Zosyn.  We will add Mucinex as well as     nebulizer treatments to determine efficacy.  Again, repeat chest x-     ray is currently pending as well as admission lab work.  We will     evaluate when these lab values return to determine need for further     treatment.  No indication for steroids at this time as wheezing is     very minimal, however the patient may require steroids during    hospitalization.  I agree that the patient will likely need follow     up chest x-ray versus CT of chest with improvement of symptoms to     rule out any underlying malignancy with history of tobacco abuse. 2. Dehydration.  Again I would wait admission lab values, we will     order for normal saline bolus now, as the patient was hypotensive     in primary provider's office and appears quite dehydrated on     physical exam. 3. Hypotension.  Felt related to dehydration.  We will check blood     cultures x2 as well as LDH.  Order for IV     fluids and monitor. 4. Question of thrush.  We will monitor with p.o. intake. 5. Prophylaxis.  We will order for subcu Lovenox for DVT prophylaxis.     Cordelia Pen, NP   ______________________________ Conley Canal, MD    LE/MEDQ   D:  10/27/2010  T:  10/27/2010  Job:  [id number]  cc:   Sandford Craze, NP  Electronically Signed by Cordelia Pen NP on 12/22/2010 12:12:05 PM Electronically Signed by Conley Canal  on 12/28/2010 07:31:32 PM

## 2010-12-29 NOTE — Telephone Encounter (Signed)
Pt returned call and left message.  i attempted to call pt but had to leave 2nd message.

## 2010-12-29 NOTE — Telephone Encounter (Signed)
RC from pt.  Advised of below.  Pt is relieved and agreeable.

## 2011-01-02 ENCOUNTER — Ambulatory Visit (HOSPITAL_BASED_OUTPATIENT_CLINIC_OR_DEPARTMENT_OTHER)
Admission: RE | Admit: 2011-01-02 | Discharge: 2011-01-02 | Disposition: A | Discharge status: Home / Self Care | Payer: BC Managed Care – PPO | Source: Ambulatory Visit | Attending: Pulmonary Disease | Admitting: Pulmonary Disease

## 2011-01-02 ENCOUNTER — Other Ambulatory Visit: Payer: Self-pay | Admitting: Pulmonary Disease

## 2011-01-02 ENCOUNTER — Encounter: Payer: Self-pay | Admitting: Pulmonary Disease

## 2011-01-02 ENCOUNTER — Ambulatory Visit (INDEPENDENT_AMBULATORY_CARE_PROVIDER_SITE_OTHER): Payer: BC Managed Care – PPO | Admitting: Pulmonary Disease

## 2011-01-02 DIAGNOSIS — J449 Chronic obstructive pulmonary disease, unspecified: Secondary | ICD-10-CM

## 2011-01-02 DIAGNOSIS — J189 Pneumonia, unspecified organism: Secondary | ICD-10-CM

## 2011-01-02 NOTE — Progress Notes (Signed)
  Subjective:    Patient ID: George Marshall, male    DOB: Sep 22, 1950, 60 y.o.   MRN: [id number]  HPI PCP - [name] ' [place], [name]  Heme [name]   59/M, ex smoker , quit '02  for FU of pneumonia.  Admitted 5/11-13/12 for RUL pneumonia, neutropenia >> diagnosed with RA, pos factor, high CCP  Given prednisone by dr [name] for knee swelling, joint tapped  Neutropenia/Anemia/B12 Deficiency was evaluated by dr [name] & bone marrow bx -nml Reviewed CT chest from 5/1 & 5/21>. RUL segmental consolidation & RLL patchy infiltrate.  WC 1.3 on 5/31  He lost 13 lbs during this ilness, but appetite has returned.  No cough,fevers, chest pain, hemopysis.  Spirometry showed moderate airway obstruction with FEv1 56%  01/02/2011 Breathing ok No cough, fever. Seen by Dr Dierdre Forth - on methotrexate now, both wrists swollen & tender. Counts have improved.    Review of Systems Pt denies any significant  nasal congestion or excess secretions, fever, chills, sweats, unintended wt loss, pleuritic or exertional cp, orthopnea pnd or leg swelling.  Pt also denies any obvious fluctuation in symptoms with weather or environmental change or other alleviating or aggravating factors.    Pt denies any increase in rescue therapy over baseline, denies waking up needing it or having early am exacerbations or coughing/wheezing/ or dyspnea      Objective:   Physical Exam Gen. Pleasant, well-nourished, in no distress ENT - no lesions, no post nasal drip Neck: No JVD, no thyromegaly, no carotid bruits Lungs: no use of accessory muscles, no dullness to percussion, clear without rales or rhonchi  Cardiovascular: Rhythm regular, heart sounds  normal, no murmurs or gallops, no peripheral edema Musculoskeletal: Bl swollen, tender wrists, no cyanosis or clubbing         Assessment & Plan:

## 2011-01-02 NOTE — Assessment & Plan Note (Signed)
Almost resolved on CXR. Favor infectious rather than inflammatory.

## 2011-01-02 NOTE — Patient Instructions (Signed)
Your pneumonia has resolved We discussed signs & symptoms of COPD flare

## 2011-01-02 NOTE — Assessment & Plan Note (Signed)
Observe for now Albuterol MDI if symptomatic

## 2011-01-06 ENCOUNTER — Encounter: Payer: Self-pay | Admitting: Family Medicine

## 2011-03-23 ENCOUNTER — Encounter: Payer: Self-pay | Admitting: Family Medicine

## 2011-03-26 ENCOUNTER — Ambulatory Visit: Payer: BC Managed Care – PPO | Admitting: Family Medicine

## 2011-03-27 ENCOUNTER — Ambulatory Visit (INDEPENDENT_AMBULATORY_CARE_PROVIDER_SITE_OTHER): Payer: BC Managed Care – PPO | Admitting: Family Medicine

## 2011-03-27 ENCOUNTER — Encounter: Payer: Self-pay | Admitting: Family Medicine

## 2011-03-27 VITALS — BP 105/72 | HR 111 | Temp 97.9°F | Ht 71.0 in | Wt 138.0 lb

## 2011-03-27 DIAGNOSIS — M069 Rheumatoid arthritis, unspecified: Secondary | ICD-10-CM

## 2011-03-27 DIAGNOSIS — D649 Anemia, unspecified: Secondary | ICD-10-CM

## 2011-03-27 DIAGNOSIS — J329 Chronic sinusitis, unspecified: Secondary | ICD-10-CM

## 2011-03-27 LAB — CBC WITH DIFFERENTIAL/PLATELET
Basophils Absolute: 0 10*3/uL (ref 0.0–0.1)
Eosinophils Absolute: 0 10*3/uL (ref 0.0–0.7)
Eosinophils Relative: 1.1 % (ref 0.0–5.0)
HCT: 38.9 % — ABNORMAL LOW (ref 39.0–52.0)
Lymphs Abs: 0.7 10*3/uL (ref 0.7–4.0)
MCV: 88.4 fl (ref 78.0–100.0)
Monocytes Absolute: 0.5 10*3/uL (ref 0.1–1.0)
Neutrophils Relative %: 67.7 % (ref 43.0–77.0)
Platelets: 296 10*3/uL (ref 150.0–400.0)
RDW: 17.2 % — ABNORMAL HIGH (ref 11.5–14.6)
WBC: 3.9 10*3/uL — ABNORMAL LOW (ref 4.5–10.5)

## 2011-03-27 LAB — IBC PANEL
Iron: 33 ug/dL — ABNORMAL LOW (ref 42–165)
Transferrin: 182.2 mg/dL — ABNORMAL LOW (ref 212.0–360.0)

## 2011-03-27 MED ORDER — FEXOFENADINE HCL 180 MG PO TABS: 180.0000 mg | ORAL_TABLET | Freq: Every day | ORAL | Status: DC

## 2011-03-27 MED ORDER — AMOXICILLIN 875 MG PO TABS: 875.0000 mg | ORAL_TABLET | Freq: Two times a day (BID) | ORAL | Status: AC

## 2011-03-27 MED ORDER — FLUTICASONE PROPIONATE 50 MCG/ACT NA SUSP: NASAL | Status: DC

## 2011-03-27 NOTE — Assessment & Plan Note (Signed)
Stable.  Continue methotrexate and rheum f/u.

## 2011-03-27 NOTE — Assessment & Plan Note (Signed)
Allergic most likely, with superimposed infection possible (he is mildly immunosuppressed)--will tx with amoxil 875mg  bid x 10d as well as start daily allegra 180mg  and flonase.

## 2011-03-27 NOTE — Assessment & Plan Note (Addendum)
Normocytic, very likely from chronic disease state/RA. However, will check iron panel, ferritin, and do iFOB screening today. Of note, he has never had colon cancer screening.  He plans on giving the ok for referral for this soon per his report today.

## 2011-03-27 NOTE — Progress Notes (Signed)
OFFICE NOTE  03/27/2011  CC:  Chief Complaint  Patient presents with  . Anemia  . Rheumatoid Arthritis    sees Rheumatologist, doing better     HPI:   Patient is a 60 y.o. Caucasian male who is here for f/u rheumatoid arthritis, normocytic anemia. Joints-minimal complaints--hands and knees with mild pain/minimal swelling.  Methotrexate recently increased to 25mg  qWeek --plans are for re-eval at rheumatologists after 37mo of this.  Not using any NSAIDs or narcotic pain meds. Has 68mo of nasal congestion, PND, some mild cough.  Scant mucous when blows nose, some ear pressure.  No fever, no ST, no HA, no wheezing or SOB.  Has hx of allergies that usually flare in the fall, has been on allergy shots in the distant past.  No daily allergy meds lately.   Pertinent PMH:  Past Medical History  Diagnosis Date  . Hyperlipidemia   . GERD (gastroesophageal reflux disease)   . History of syncope 12/2004  . Neutropenia 11/08/2010  . B12 deficiency 11/08/2010  . Iron deficiency 11/08/2010  . Rheumatoid arthritis 10/2010  . COPD (chronic obstructive pulmonary disease)     Dr. Vassie Loll    MEDS;   Outpatient Prescriptions Prior to Visit  Medication Sig Dispense Refill  . folic acid (FOLVITE) 1 MG tablet Take 1 mg by mouth daily.        . methotrexate (RHEUMATREX) 2.5 MG tablet Take 25 mg by mouth once a week. Caution:Chemotherapy. Protect from light.      Marland Kitchen HYDROcodone-acetaminophen (VICODIN) 5-500 MG per tablet Take 1 tablet by mouth every 6 (six) hours as needed.        Marland Kitchen ibuprofen (ADVIL,MOTRIN) 600 MG tablet 1 tab po bid prn joint pains  60 tablet  3  . oxyCODONE-acetaminophen (TYLOX) 5-500 MG per capsule 1-2 tabs po q6h as needed for pain  40 capsule  0    PE: Blood pressure 105/72, pulse 111, temperature 97.9 F (36.6 C), temperature source Oral, height 5\' 11"  (1.803 m), weight 138 lb (62.596 kg), SpO2 99.00%. Gen: Alert, well appearing.  Patient is oriented to person, place, time, and  situation. VS: noted--normal. Gen: alert, NAD, NONTOXIC APPEARING. HEENT: eyes without injection, drainage, or swelling.  Ears: EACs clear, TMs with normal light reflex and landmarks.  Nose: Clear rhinorrhea, with some dried, crusty exudate adherent to mildly injected mucosa.  No purulent d/c.  No paranasal sinus TTP.  No facial swelling.  Throat and mouth without focal lesion.  No pharyngial swelling, erythema, or exudate.   Neck: supple, no LAD.   LUNGS: CTA bilat, nonlabored resps.   CV: RRR, no m/r/g. EXT: no c/c/e SKIN: no rash  LABS: none today  IMPRESSION AND PLAN:  Rheumatoid arthritis Stable.  Continue methotrexate and rheum f/u.  Anemia Normocytic, very likely from chronic disease state/RA. However, will check iron panel, ferritin, and do iFOB screening today. Of note, he has never had colon cancer screening.  He plans on giving the ok for referral for this soon per his report today.  Sinusitis Allergic most likely, with superimposed infection possible (he is mildly immunosuppressed)--will tx with amoxil 875mg  bid x 10d as well as start daily allegra 180mg  and flonase.    Flu vaccine IM today.  Cerumen impaction: bilat, minimal removed with irrigation today.  Encouraged use of OTC dissolving drops daily.  Hyperlipidemia: noted in past records (2007 LDL high).  Plan repeat at f/u in 6 mo.  FOLLOW UP:  Return in about 6 months (  around 09/25/2011) for f/u anemia and hyperlipidemia.

## 2011-09-25 ENCOUNTER — Ambulatory Visit: Payer: BC Managed Care – PPO | Admitting: Family Medicine

## 2012-03-08 ENCOUNTER — Encounter: Payer: Self-pay | Admitting: *Deleted

## 2012-03-08 ENCOUNTER — Emergency Department (INDEPENDENT_AMBULATORY_CARE_PROVIDER_SITE_OTHER): Payer: BC Managed Care – PPO

## 2012-03-08 ENCOUNTER — Emergency Department
Admission: EM | Admit: 2012-03-08 | Discharge: 2012-03-08 | Disposition: A | Discharge status: Home / Self Care | Payer: BC Managed Care – PPO | Source: Home / Self Care

## 2012-03-08 DIAGNOSIS — J069 Acute upper respiratory infection, unspecified: Secondary | ICD-10-CM

## 2012-03-08 DIAGNOSIS — Z0389 Encounter for observation for other suspected diseases and conditions ruled out: Secondary | ICD-10-CM

## 2012-03-08 DIAGNOSIS — R062 Wheezing: Secondary | ICD-10-CM

## 2012-03-08 LAB — POCT CBC W AUTO DIFF (K'VILLE URGENT CARE)

## 2012-03-08 LAB — TSH: TSH: 0.704 u[IU]/mL (ref 0.350–4.500)

## 2012-03-08 LAB — COMPREHENSIVE METABOLIC PANEL
ALT: 15 U/L (ref 0–53)
AST: 21 U/L (ref 0–37)
Albumin: 3.8 g/dL (ref 3.5–5.2)
Alkaline Phosphatase: 89 U/L (ref 39–117)
BUN: 11 mg/dL (ref 6–23)
Chloride: 100 mEq/L (ref 96–112)
Potassium: 3.6 mEq/L (ref 3.5–5.3)
Sodium: 137 mEq/L (ref 135–145)

## 2012-03-08 LAB — POCT INFLUENZA A/B: Influenza B, POC: NEGATIVE

## 2012-03-08 MED ORDER — PREDNISONE 50 MG PO TABS: ORAL_TABLET | ORAL | Status: DC

## 2012-03-08 MED ORDER — AZITHROMYCIN 250 MG PO TABS: ORAL_TABLET | ORAL | Status: DC

## 2012-03-08 NOTE — ED Notes (Signed)
Pt complains of extreme fatigue, weakness, sweats, chills, insomnia, and fevers for the past wk.

## 2012-03-08 NOTE — ED Provider Notes (Signed)
History     CSN: [id number]  Arrival date & time 03/08/12  1208   First MD Initiated Contact with Patient 03/08/12 1218      No chief complaint on file.   HPI URI Symptoms Onset: 1 week Description: rhinorrhea, nasal congestion, generalized malaise and fatigue.  Modifying factors:  Baseline hx/o RA and COPD. On MTX and humira. Also with secondary neutropenia. No increased WOB, wheezing, cough, SOB, CP.   Symptoms Nasal discharge: mild  Fever: no Sore throat: no Cough: minimal  Wheezing: minimal  Ear pain: no GI symptoms: no Sick contacts: unsure  Red Flags  Stiff neck: no Dyspnea: no Rash: no Swallowing difficulty: no  Sinusitis Risk Factors Headache/face pain: no Double sickening: no tooth pain: no  Allergy Risk Factors Sneezing: no Itchy scratchy throat: no Seasonal symptoms: no  Flu Risk Factors Headache: no muscle aches: no severe fatigue: mild    Past Medical History  Diagnosis Date  . Hyperlipidemia   . GERD (gastroesophageal reflux disease)   . History of syncope 12/2004  . Neutropenia 11/08/2010  . B12 deficiency 11/08/2010  . Iron deficiency 11/08/2010  . Rheumatoid arthritis 10/2010  . COPD (chronic obstructive pulmonary disease)     Dr. Vassie Loll    Past Surgical History  Procedure Date  . Inguinal hernia repair     age 61    Family History  Problem Relation Age of Onset  . Hypertension      parents late 11's  . Cancer Mother     breast and cervical    History  Substance Use Topics  . Smoking status: Former Smoker -- 1.5 packs/day for 30 years    Types: Cigarettes    Quit date: 06/18/2000  . Smokeless tobacco: Current User    Types: Chew  . Alcohol Use: Yes     rare      Review of Systems  All other systems reviewed and are negative.    Allergies  Review of patient's allergies indicates no known allergies.  Home Medications   Current Outpatient Rx  Name Route Sig Dispense Refill  . FEXOFENADINE HCL 180 MG PO TABS  Oral Take 1 tablet (180 mg total) by mouth daily. 30 tablet 3  . FLUTICASONE PROPIONATE 50 MCG/ACT NA SUSP  2 sprays in each nostril once daily 16 g 3  . FOLIC ACID 1 MG PO TABS Oral Take 1 mg by mouth daily.      Marland Kitchen HYDROCODONE-ACETAMINOPHEN 5-500 MG PO TABS Oral Take 1 tablet by mouth every 6 (six) hours as needed.      . IBUPROFEN 600 MG PO TABS  1 tab po bid prn joint pains 60 tablet 3  . METHOTREXATE 2.5 MG PO TABS Oral Take 25 mg by mouth once a week. Caution:Chemotherapy. Protect from light.    . OXYCODONE-ACETAMINOPHEN 5-500 MG PO CAPS  1-2 tabs po q6h as needed for pain 40 capsule 0    There were no vitals taken for this visit.  Physical Exam  Constitutional:       Underweight, mildly unkempt   HENT:  Head: Normocephalic and atraumatic.  Right Ear: External ear normal.  Left Ear: External ear normal.       +nasal erythema, rhinorrhea bilaterally, + post oropharyngeal erythema    Eyes: Conjunctivae normal are normal. Pupils are equal, round, and reactive to light.  Neck: Normal range of motion. Neck supple.  Cardiovascular: Normal rate, regular rhythm and normal heart sounds.   Pulmonary/Chest: Effort normal  and breath sounds normal.       Faint apical wheezes   Abdominal: Soft. Bowel sounds are normal.  Musculoskeletal: Normal range of motion.  Lymphadenopathy:    He has no cervical adenopathy.  Neurological: He is alert.  Skin: Skin is warm.    ED Course  Procedures (including critical care time)   Labs Reviewed  POCT CBC W AUTO DIFF ([facility])   Dg Chest 2 View  03/08/2012  *RADIOLOGY REPORT*  Clinical Data: Rule out infiltrate, fatigue  CHEST - 2 VIEW  Comparison: 08/04/2010  Findings: Cardiomediastinal silhouette is stable.  Bilateral nodular nipple shadow again noted.  No acute infiltrate or pleural effusion.  Stable hyperinflation and chronic mild interstitial prominence.  Bony thorax is unremarkable.  IMPRESSION: No active disease.  Stable  hyperinflation and chronic mild interstitial prominence.   Original Report Authenticated By: Natasha Mead, M.D.      1. URI (upper respiratory infection)       MDM  Labs and imaging reviewed.  General exam and history are somewhat reassuring.  Suspect this is an underlying viral process causing sxs.  Noted medical RFs include COPD and immunosuppressant medication use.  WBC count is stable from last check 03/2011.  Hgb and Plts are also stable.  No focal infiltrate on CXR.  Will place pt on course of prednisone and azithromycin for COPD/pulmonary and infectious/atypical coverage.  Plan for pt to follow up with PCP on Monday.  Go to ED if sxs worsen or fail to improve.      The patient and/or caregiver has been counseled thoroughly with regard to treatment plan and/or medications prescribed including dosage, schedule, interactions, rationale for use, and possible side effects and they verbalize understanding. Diagnoses and expected course of recovery discussed and will return if not improved as expected or if the condition worsens. Patient and/or caregiver verbalized understanding.             [name], MD 03/08/12 1331

## 2012-03-10 ENCOUNTER — Telehealth: Payer: Self-pay | Admitting: Emergency Medicine

## 2012-03-10 LAB — STREP A DNA PROBE: GASP: NEGATIVE

## 2012-07-22 ENCOUNTER — Ambulatory Visit (INDEPENDENT_AMBULATORY_CARE_PROVIDER_SITE_OTHER): Payer: BC Managed Care – PPO | Admitting: Pulmonary Disease

## 2012-07-22 ENCOUNTER — Encounter: Payer: Self-pay | Admitting: Pulmonary Disease

## 2012-07-22 VITALS — BP 138/82 | HR 117 | Temp 97.8°F | Ht 71.0 in | Wt 159.0 lb

## 2012-07-22 DIAGNOSIS — J449 Chronic obstructive pulmonary disease, unspecified: Secondary | ICD-10-CM

## 2012-07-22 NOTE — Progress Notes (Signed)
  Subjective:    Patient ID: George Marshall, male    DOB: 03-09-1951, 62 y.o.   MRN: [id number]  HPI  PCP - George Marshall  Heme George Marshall   61/M, ex smoker , quit '02  for FU of  Gold B COPD.  Admitted 5/11-13/12 for RUL pneumonia, neutropenia >> diagnosed with RA, pos factor, high CCP  Given prednisone by dr [name] for knee swelling, joint tapped  Neutropenia/Anemia/B12 Deficiency was evaluated by dr George Marshall & bone marrow bx -nml Reviewed CT chest from 5/1 & 5/21>. RUL segmental consolidation & RLL patchy infiltrate.  WC 1.3 on 5/31 -improved subsequently ? 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 Hyperlipidemia   . GERD (gastroesophageal reflux disease)   . History of syncope 12/2004  . Neutropenia 11/08/2010  . B12 deficiency 11/08/2010  . Iron deficiency 11/08/2010  . Rheumatoid arthritis 10/2010  . COPD (chronic obstructive pulmonary disease)     Dr. Vassie Loll      Review of Systems neg for any significant sore throat, dysphagia, itching, sneezing, nasal congestion or excess/ purulent secretions, fever, chills, sweats, unintended wt loss, pleuritic or exertional cp, hempoptysis, orthopnea pnd or change in chronic leg swelling. Also denies presyncope, palpitations, heartburn, abdominal pain, nausea, vomiting, diarrhea or change in bowel or urinary habits, dysuria,hematuria, rash, arthralgias, visual complaints, headache, numbness weakness or ataxia.     Objective:   Physical Exam  Gen. Pleasant, well-nourished, in no distress ENT - no  lesions, no post nasal drip Neck: No JVD, no thyromegaly, no carotid bruits Lungs: no use of accessory muscles, no dullness to percussion, decreased without rales or rhonchi  Cardiovascular: Rhythm regular, heart sounds  normal, no murmurs or gallops, no peripheral edema Musculoskeletal: No deformities, no cyanosis or clubbing        Assessment & Plan:

## 2012-07-22 NOTE — Assessment & Plan Note (Signed)
Gold B 2/14 FEV1 53%, quit smoking '02 His lung function is low yet stable & more importantly, he is relatively asymptomatic. 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 He is advised to keep close FU with [place] so we cna monitor lung function. Offered pulmonary rehab program Flu shot advised every year Pneumovax at age 60 or sooner if he has recurrent infections. Discussed early signs of bronchitis for which he should seek attention

## 2012-07-22 NOTE — Patient Instructions (Signed)
Will send clearance note to Dr Gracy Racer [phone] No restrictions Call early for symptoms of bronchitis

## 2012-09-16 DIAGNOSIS — S92401A Displaced unspecified fracture of right great toe, initial encounter for closed fracture: Secondary | ICD-10-CM

## 2012-09-16 HISTORY — DX: Displaced unspecified fracture of right great toe, initial encounter for closed fracture: S92.401A

## 2012-09-26 ENCOUNTER — Encounter: Payer: Self-pay | Admitting: *Deleted

## 2012-09-26 ENCOUNTER — Ambulatory Visit (INDEPENDENT_AMBULATORY_CARE_PROVIDER_SITE_OTHER): Payer: BC Managed Care – PPO | Admitting: Sports Medicine

## 2012-09-26 ENCOUNTER — Emergency Department (INDEPENDENT_AMBULATORY_CARE_PROVIDER_SITE_OTHER): Payer: BC Managed Care – PPO

## 2012-09-26 ENCOUNTER — Emergency Department
Admission: EM | Admit: 2012-09-26 | Discharge: 2012-09-26 | Disposition: A | Discharge status: Home / Self Care | Payer: BC Managed Care – PPO | Source: Home / Self Care | Attending: Family Medicine | Admitting: Family Medicine

## 2012-09-26 DIAGNOSIS — S92919A Unspecified fracture of unspecified toe(s), initial encounter for closed fracture: Secondary | ICD-10-CM

## 2012-09-26 DIAGNOSIS — S92911A Unspecified fracture of right toe(s), initial encounter for closed fracture: Secondary | ICD-10-CM

## 2012-09-26 DIAGNOSIS — M2010 Hallux valgus (acquired), unspecified foot: Secondary | ICD-10-CM

## 2012-09-26 DIAGNOSIS — W2203XA Walked into furniture, initial encounter: Secondary | ICD-10-CM

## 2012-09-26 DIAGNOSIS — S92501A Displaced unspecified fracture of right lesser toe(s), initial encounter for closed fracture: Secondary | ICD-10-CM | POA: Insufficient documentation

## 2012-09-26 DIAGNOSIS — M19079 Primary osteoarthritis, unspecified ankle and foot: Secondary | ICD-10-CM

## 2012-09-26 HISTORY — DX: Displaced unspecified fracture of right lesser toe(s), initial encounter for closed fracture: S92.501A

## 2012-09-26 MED ORDER — HYDROCODONE-ACETAMINOPHEN 5-325 MG PO TABS: 1.0000 | ORAL_TABLET | Freq: Three times a day (TID) | ORAL | Status: DC | PRN

## 2012-09-26 NOTE — Progress Notes (Signed)
   Subjective:    I'm seeing this patient as a consultation for:  Dr. Alvester Morin  CC: Toe pain  HPI: George Marshall kicked a box earlier today, and had immediate pain and swelling over his fifth toe. Pain is localized, severe, does not radiate. Sharp. He was seen by the urgent care physician, x-rays were done that showed a fracture and I was called for further assessment and definitive treatment.  Past medical history, Surgical history, Family history not pertinant except as noted below, Social history, Allergies, and medications have been entered into the medical record, reviewed, and no changes needed.   Review of Systems: No headache, visual changes, nausea, vomiting, diarrhea, constipation, dizziness, abdominal pain, skin rash, fevers, chills, night sweats, weight loss, swollen lymph nodes, body aches, joint swelling, muscle aches, chest pain, shortness of breath, mood changes, visual or auditory hallucinations.   Objective:   General: Well Developed, well nourished, and in no acute distress.  Neuro/Psych: Alert and oriented x3, extra-ocular muscles intact, able to move all 4 extremities, sensation grossly intact. Skin: Warm and dry, no rashes noted.  Respiratory: Not using accessory muscles, speaking in full sentences, trachea midline.  Cardiovascular: Pulses palpable, no extremity edema. Abdomen: Does not appear distended. Right foot: Right fifth toe is swollen, mildly bruised. There's tenderness to palpation over the proximal phalanx. Neurovascularly intact distally.  X-rays reviewed show a fracture through the shaft of the proximal phalanx of the right toe, this is mildly apex volar angulated, nondisplaced.  Toe was buddy taped, postop shoe placed. Impression and Recommendations:   This case required medical decision making of moderate complexity.

## 2012-09-26 NOTE — ED Provider Notes (Signed)
History     CSN: [id number]  Arrival date & time 09/26/12  4098   First MD Initiated Contact with Patient 09/26/12 1013      Chief Complaint  Patient presents with  . Toe Injury   HPI  Toe pain x1 day Pt accidentally stumped his toe against crate earlier today.  Has had significant 5th metatarsal pain since this point. No numbness.  Minimal swelling Currently taking MTX for RA.   Past Medical History  Diagnosis Date  . Hyperlipidemia   . GERD (gastroesophageal reflux disease)   . History of syncope 12/2004  . Neutropenia 11/08/2010  . B12 deficiency 11/08/2010  . Iron deficiency 11/08/2010  . Rheumatoid arthritis 10/2010  . COPD (chronic obstructive pulmonary disease)     Dr. Vassie Loll    Past Surgical History  Procedure Laterality Date  . Inguinal hernia repair      age 62    Family History  Problem Relation Age of Onset  . Hypertension      parents late 20's  . Cancer Mother     breast and cervical    History  Substance Use Topics  . Smoking status: Former Smoker -- 1.50 packs/day for 30 years    Types: Cigarettes    Quit date: 06/18/2000  . Smokeless tobacco: Current User    Types: Chew  . Alcohol Use: Yes     Comment: rare      Review of Systems  All other systems reviewed and are negative.    Allergies  Review of patient's allergies indicates no known allergies.  Home Medications   Current Outpatient Rx  Name  Route  Sig  Dispense  Refill  . HUMIRA PEN 40 MG/0.8ML injection               . methotrexate (RHEUMATREX) 2.5 MG tablet   Oral   Take 25 mg by mouth once a week. Caution:Chemotherapy. Protect from light.           BP 121/79  Pulse 81  Temp(Src) 97.8 F (36.6 C) (Oral)  Resp 18  Ht 5\' 11"  (1.803 m)  Wt 157 lb (71.215 kg)  BMI 21.91 kg/m2  SpO2 100%  Physical Exam  Constitutional: He appears well-developed and well-nourished.  HENT:  Head: Normocephalic.  Eyes: Conjunctivae are normal. Pupils are equal, round, and  reactive to light.  Neck: Normal range of motion. Neck supple.  Cardiovascular: Normal rate and regular rhythm.   Pulmonary/Chest: Effort normal.  Abdominal: Soft.  Musculoskeletal:       Feet:  Neurological: He is alert.    ED Course  Procedures (including critical care time)  Labs Reviewed - No data to display No results found.   No diagnosis found.    MDM  Buddy tape of affected toe and post op shoe placed.  Sports medicine consulted as they are available and he will need follow up for this issue.  Treatment and follow up per sports medicine.      The patient and/or caregiver has been counseled thoroughly with regard to treatment plan and/or medications prescribed including dosage, schedule, interactions, rationale for use, and possible side effects and they verbalize understanding. Diagnoses and expected course of recovery discussed and will return if not improved as expected or if the condition worsens. Patient and/or caregiver verbalized understanding.             [name], MD 09/26/12 1122

## 2012-09-26 NOTE — Assessment & Plan Note (Addendum)
Nondisplaced, minimally angulated. Buddy tape, postop shoe, hydrocodone. Return in 2 weeks, x-ray before visit.  I billed a fracture code for this visit, all subsequent visits for this complaint will be "post-op checks" in the global period.

## 2012-09-26 NOTE — ED Notes (Signed)
Pt c/o RT 5th toe injury x 2 hours. No OTC meds.

## 2012-09-28 ENCOUNTER — Encounter: Payer: Self-pay | Admitting: Family Medicine

## 2012-10-10 ENCOUNTER — Ambulatory Visit (INDEPENDENT_AMBULATORY_CARE_PROVIDER_SITE_OTHER): Payer: BC Managed Care – PPO | Admitting: Sports Medicine

## 2012-10-10 ENCOUNTER — Ambulatory Visit (INDEPENDENT_AMBULATORY_CARE_PROVIDER_SITE_OTHER): Payer: BC Managed Care – PPO

## 2012-10-10 ENCOUNTER — Encounter: Payer: Self-pay | Admitting: Sports Medicine

## 2012-10-10 VITALS — BP 129/78 | HR 76 | Wt 153.0 lb

## 2012-10-10 DIAGNOSIS — S8290XD Unspecified fracture of unspecified lower leg, subsequent encounter for closed fracture with routine healing: Secondary | ICD-10-CM

## 2012-10-10 DIAGNOSIS — S92501A Displaced unspecified fracture of right lesser toe(s), initial encounter for closed fracture: Secondary | ICD-10-CM

## 2012-10-10 DIAGNOSIS — IMO0001 Reserved for inherently not codable concepts without codable children: Secondary | ICD-10-CM

## 2012-10-10 DIAGNOSIS — S92501D Displaced unspecified fracture of right lesser toe(s), subsequent encounter for fracture with routine healing: Secondary | ICD-10-CM

## 2012-10-10 NOTE — Progress Notes (Signed)
  Subjective: 2 weeks status post fracture through the right fifth proximal phalanx. Pain is greatly improved, he did not have to use any pain medication. Continues to use postop shoe.   Objective: General: Well-developed, well-nourished, and in no acute distress. Foot is unremarkable to inspection, swelling and bruising has resolved. Only minimally tender over the fracture line.  X-rays reviewed, there is no further angulation or displacement of the spiral fracture through the right fifth proximal phalanx.  Assessment/plan:

## 2012-10-10 NOTE — Assessment & Plan Note (Signed)
Doing extremely well with only minimal pain. Has not used any pain medicine. X-rays do not show any further angulation or displacement. Continue postop shoe. Return in 2 weeks, x-ray before visit. If continues to do so well, I may transition him into a regular shoe.

## 2012-10-24 ENCOUNTER — Ambulatory Visit (INDEPENDENT_AMBULATORY_CARE_PROVIDER_SITE_OTHER): Payer: BC Managed Care – PPO | Admitting: Sports Medicine

## 2012-10-24 ENCOUNTER — Encounter: Payer: Self-pay | Admitting: Sports Medicine

## 2012-10-24 ENCOUNTER — Ambulatory Visit (INDEPENDENT_AMBULATORY_CARE_PROVIDER_SITE_OTHER): Payer: BC Managed Care – PPO

## 2012-10-24 VITALS — BP 126/76 | HR 76

## 2012-10-24 DIAGNOSIS — S92501D Displaced unspecified fracture of right lesser toe(s), subsequent encounter for fracture with routine healing: Secondary | ICD-10-CM

## 2012-10-24 DIAGNOSIS — IMO0001 Reserved for inherently not codable concepts without codable children: Secondary | ICD-10-CM

## 2012-10-24 DIAGNOSIS — S8290XD Unspecified fracture of unspecified lower leg, subsequent encounter for closed fracture with routine healing: Secondary | ICD-10-CM

## 2012-10-24 NOTE — Progress Notes (Signed)
  Subjective: Four-week status post fracture to the proximal phalanx of the fifth toe on the right side. Pain-free, no pain with ambulation.   Objective: General: Well-developed, well-nourished, and in no acute distress. Was unremarkable to inspection, there is no bruising, swelling, or tenderness to palpation over the fracture site. Good range of motion.  X-rays do show only slightly increased displacement, but clinically he is healed.  Assessment/plan:

## 2012-10-24 NOTE — Assessment & Plan Note (Signed)
Even though only 4 weeks out, clinically healed. Return as needed.

## 2012-12-12 IMAGING — CR DG CHEST 2V
2 series · 2 of 2 positions shown · non-contrast
Comparison: 12/12/2004.

CLINICAL DATA: 59-year-old male with weakness, weight loss, sinus
infection.  The patient denies smoking history.

CHEST - 2 VIEW

[w chest pa]
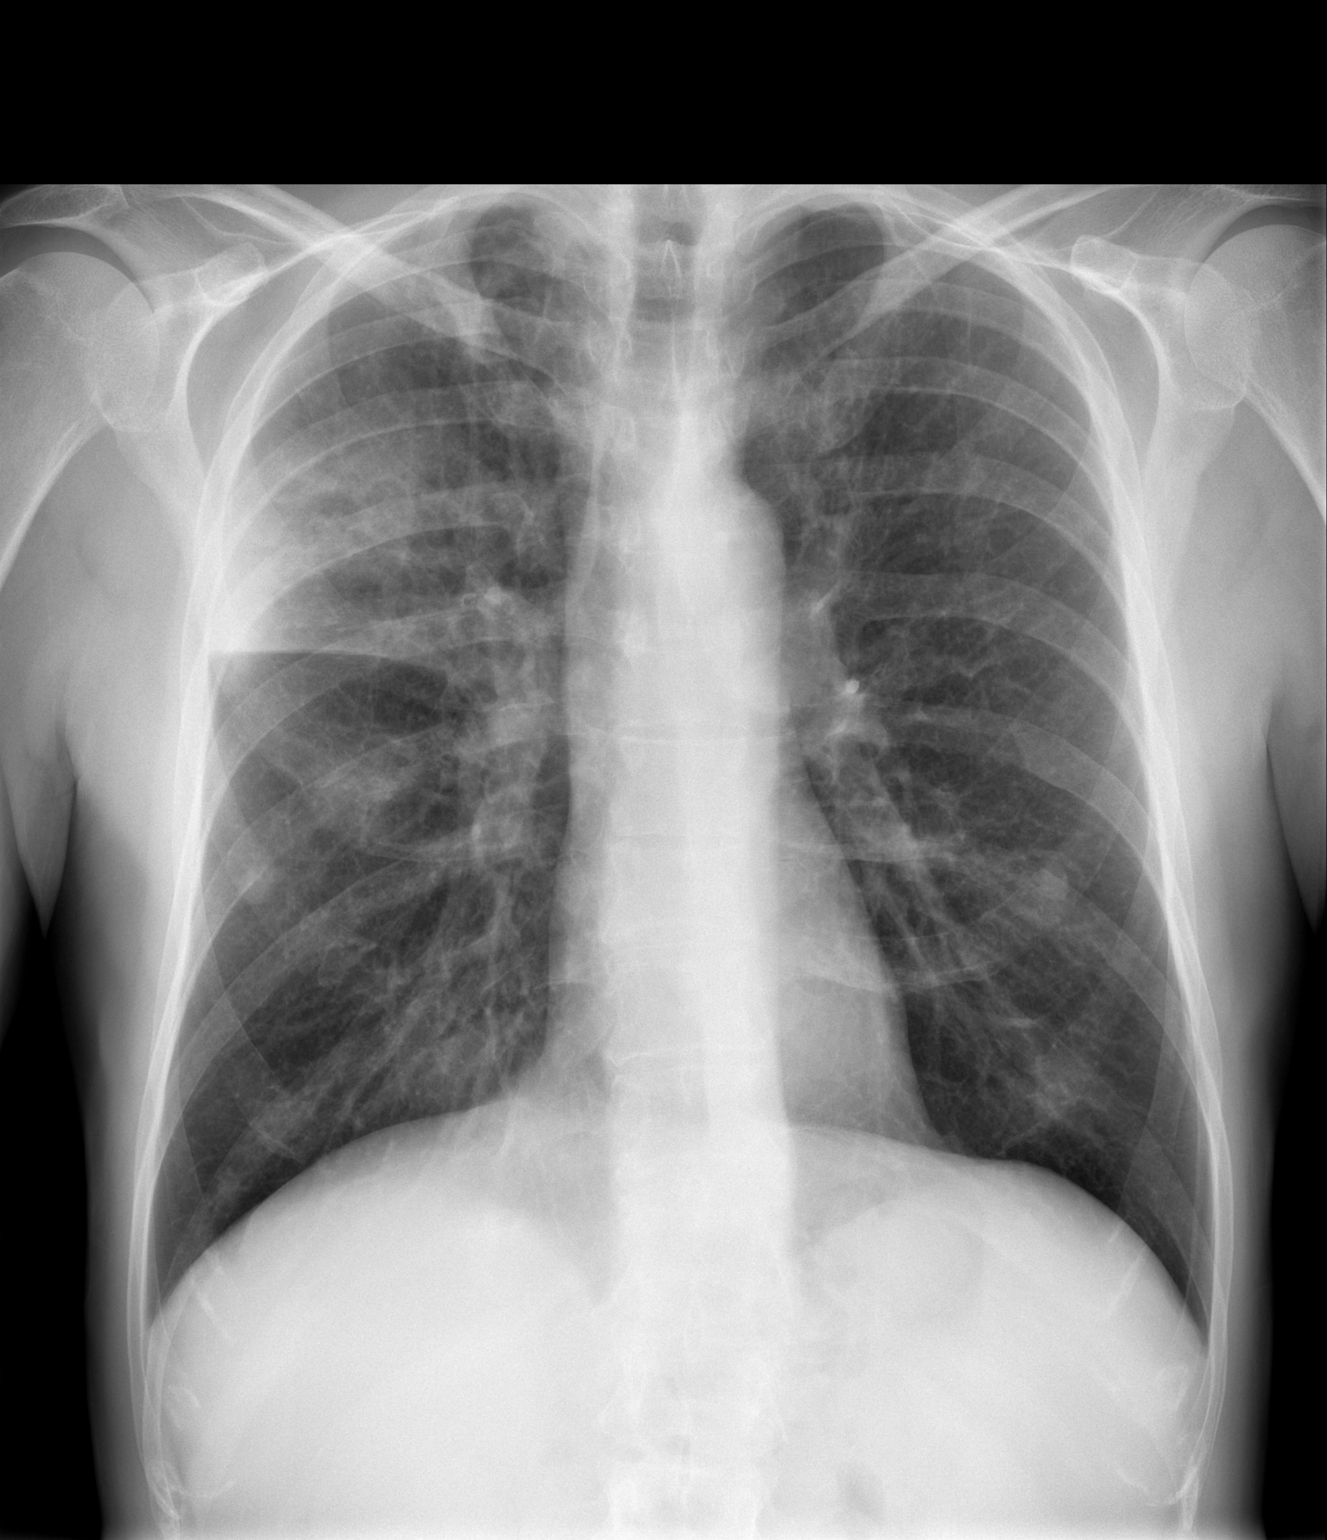

[w chest lat]
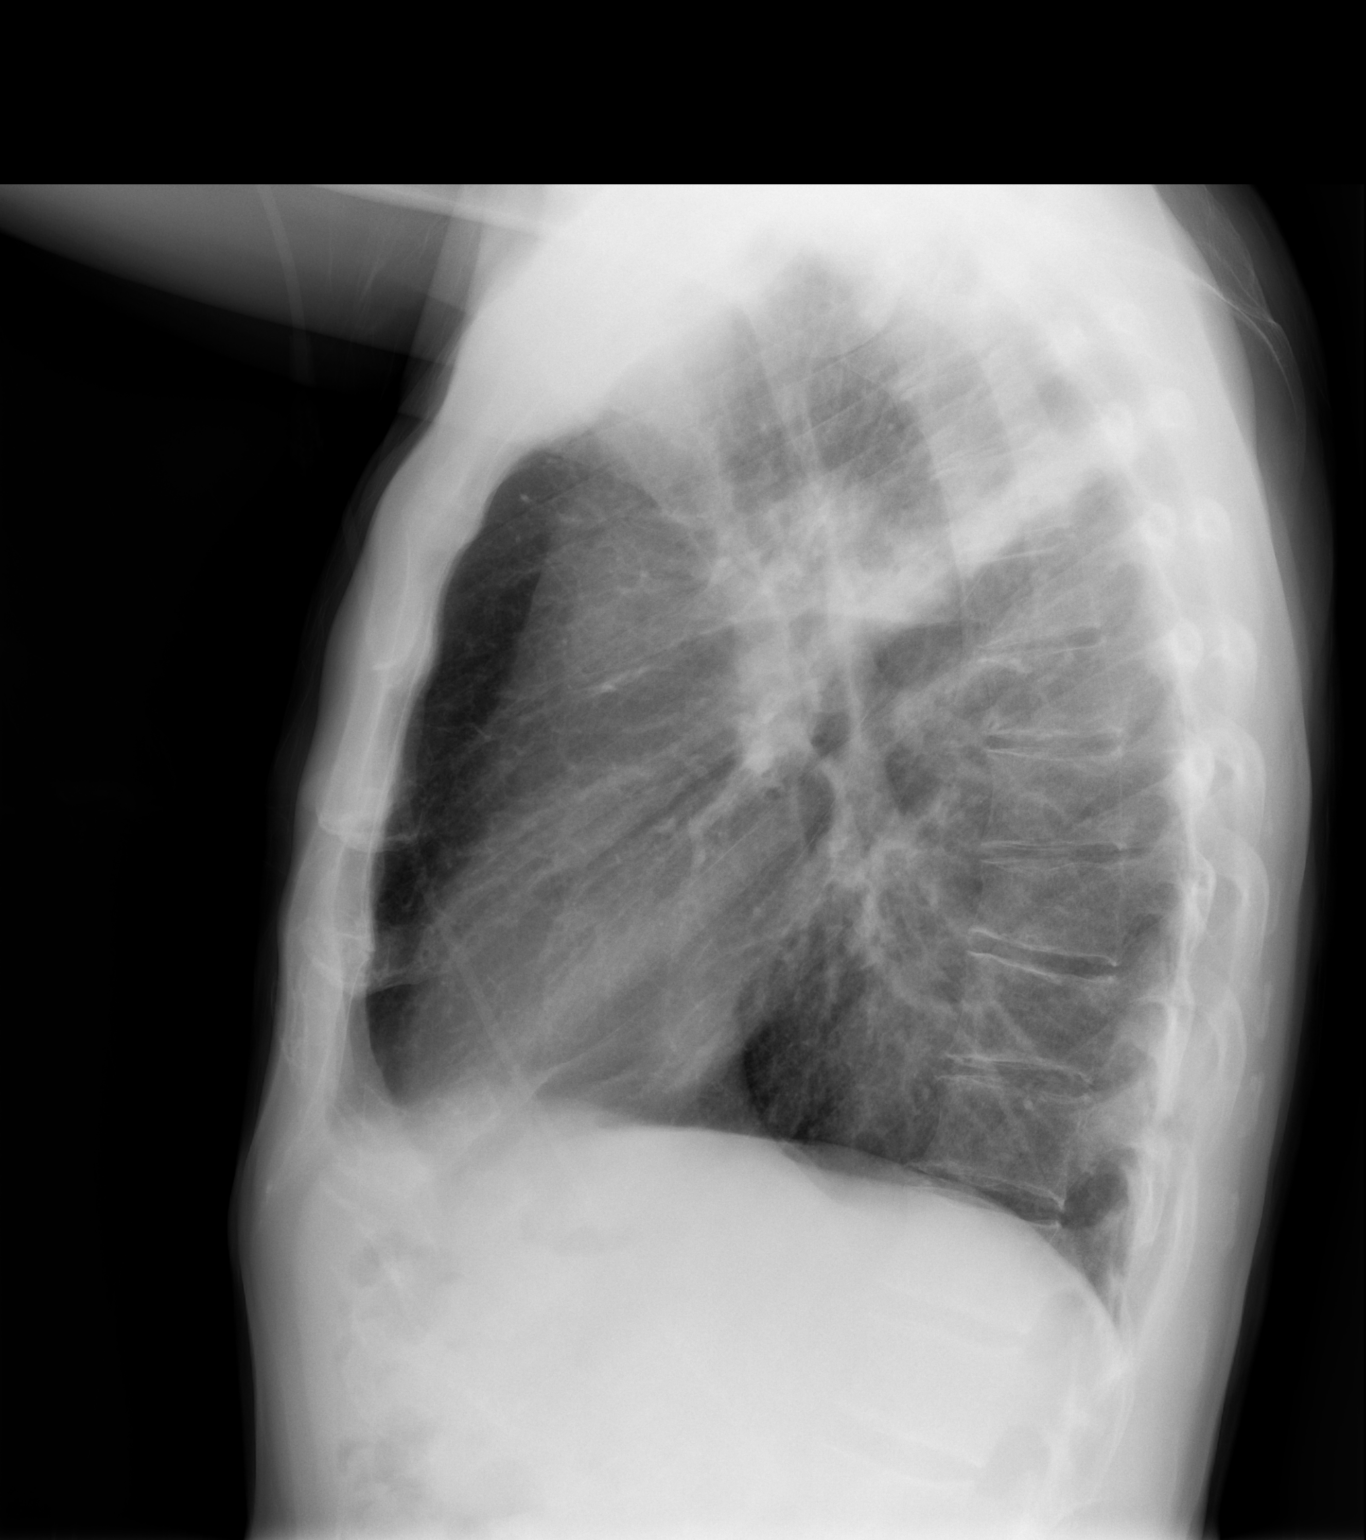

[2 of 2 positions shown; findings below may reference images not displayed]

FINDINGS: Consolidation in the posterior right upper lobe, most
pronounced peripherally.  Patchy airspace disease extends
centrally.  Right hilar contour is within normal limits. Other
mediastinal contours are within normal limits.  Large lung volumes.
No pneumothorax, pulmonary edema or pleural effusion. No acute
osseous abnormality identified.
IMPRESSION: Right upper lobe pneumonia.  Recommend post-treatment follow-up
films to ensure resolution.

## 2012-12-23 IMAGING — CT CT ABD-PELV W/ CM
1 of 3 series · 14 of 32 positions shown, 19 images · IV contrast (APPLIED)
Comparison: Chest CT from 10/17/2010

CLINICAL DATA: Anemia and vitamin B12 deficiency.  Joint pain.
Weight loss.  Question neoplasm.

CT ABDOMEN AND PELVIS WITH CONTRAST
TECHNIQUE: Multidetector CT imaging of the abdomen and pelvis was
performed following the standard protocol during bolus
administration of intravenous contrast.
Contrast: 100 ml 1mnipaque-2OO

[Series 2: abd/pelv with 5.0 b31f st · axial · 0.67mm/px · z∈[-453,-78]mm · 14 of 85 slices shown, 19 images]
[im 5/85  soft-tissue]
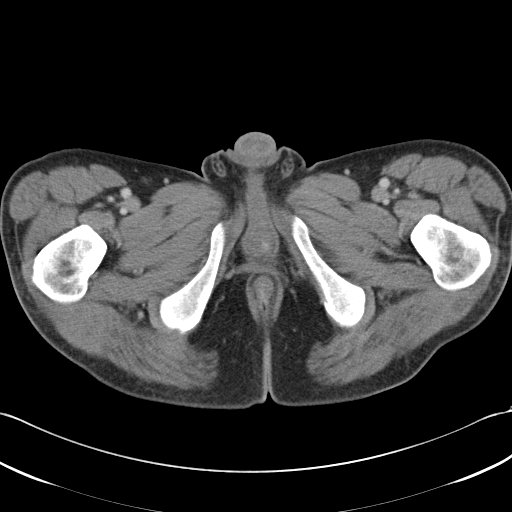
[im 5/85  bone]
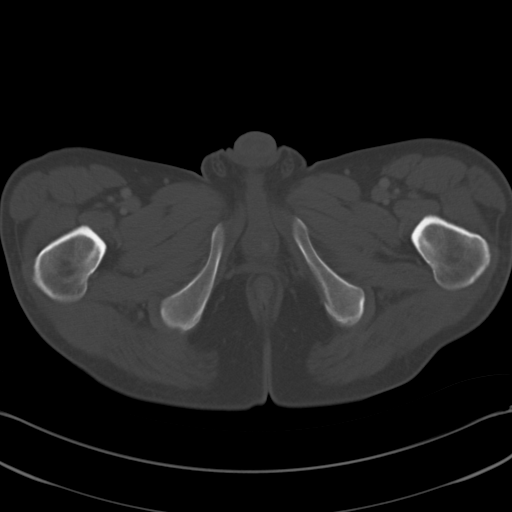
[im 14/85  soft-tissue]
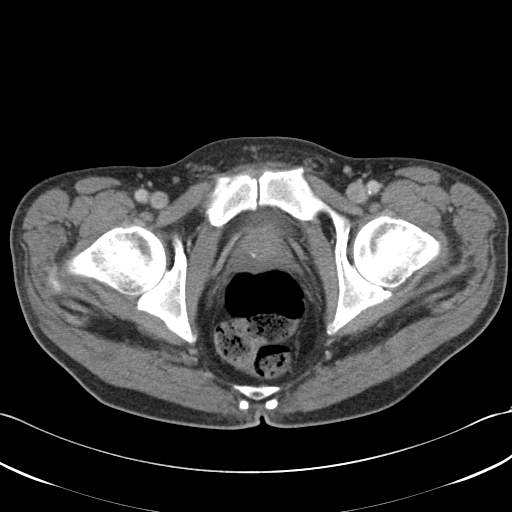
[im 18/85  soft-tissue]
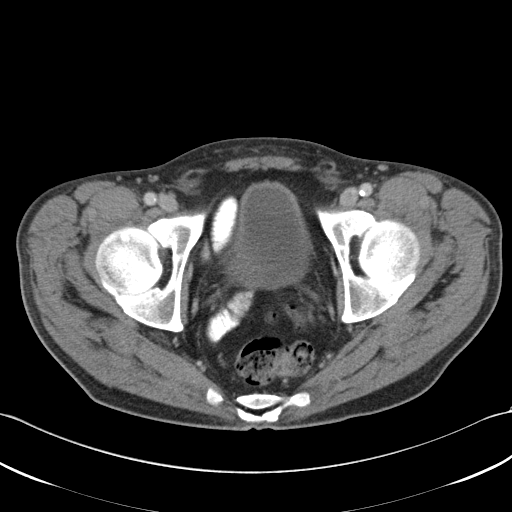
[im 23/85  soft-tissue]
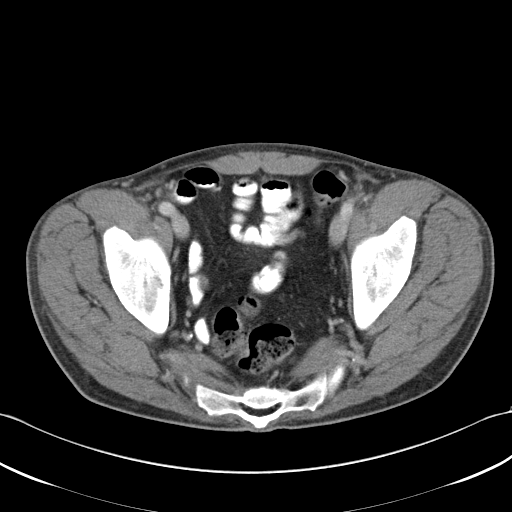
[im 31/85  soft-tissue]
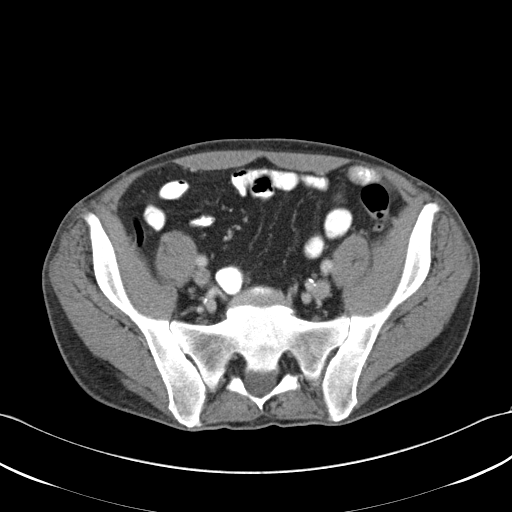
[im 36/85  soft-tissue]
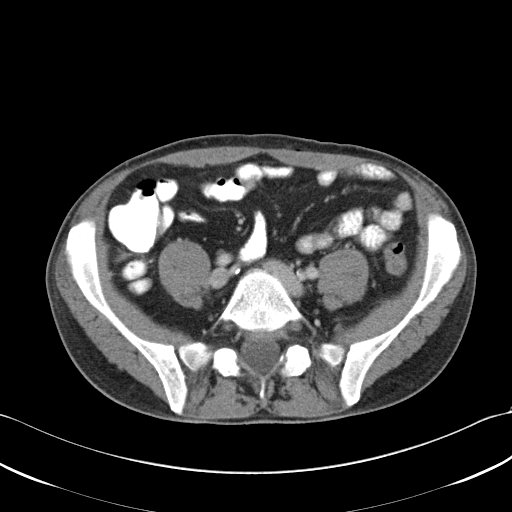
[im 45/85  soft-tissue]
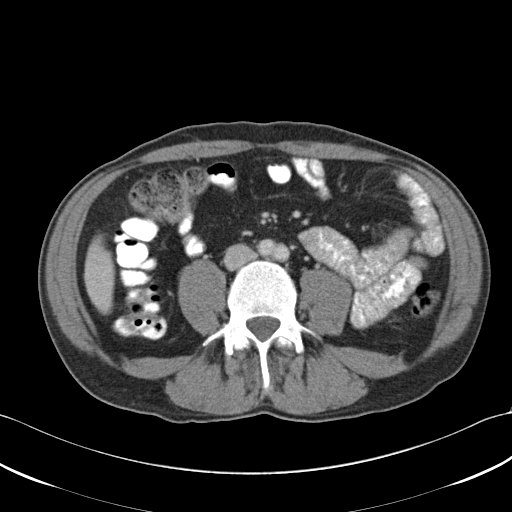
[im 49/85  soft-tissue]
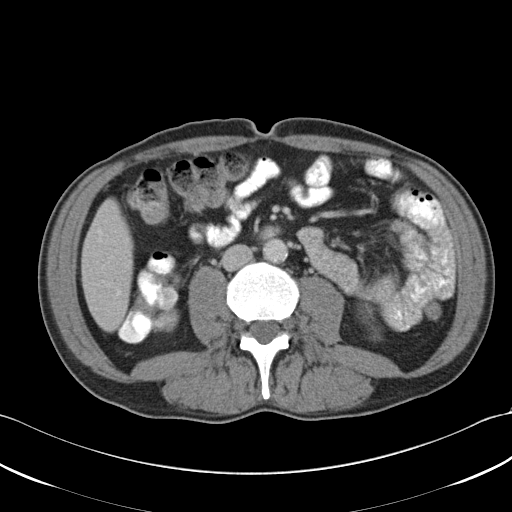
[im 54/85  soft-tissue]
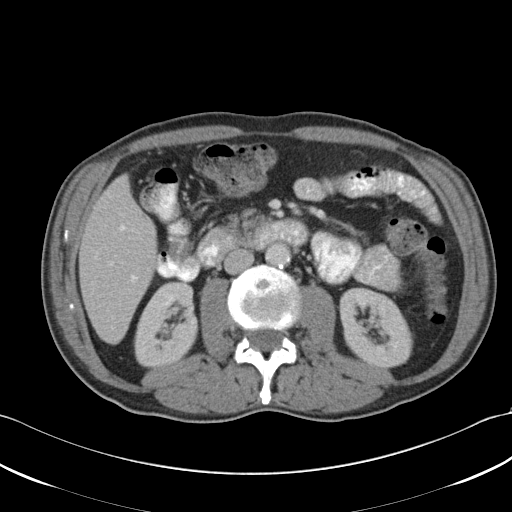
[im 54/85  bone]
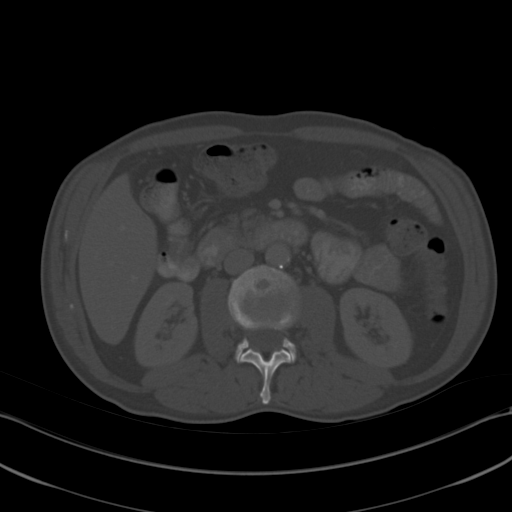
[im 62/85  soft-tissue]
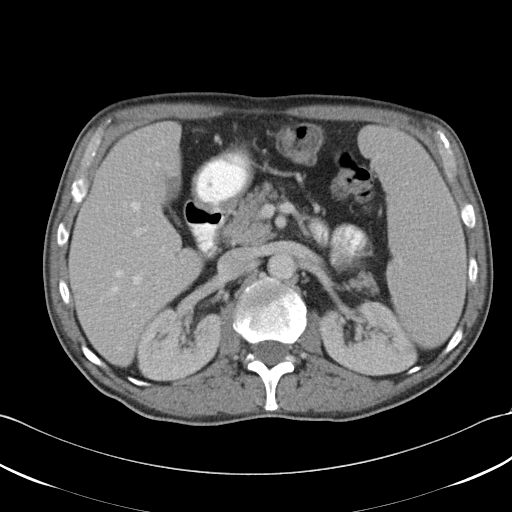
[im 67/85  soft-tissue]
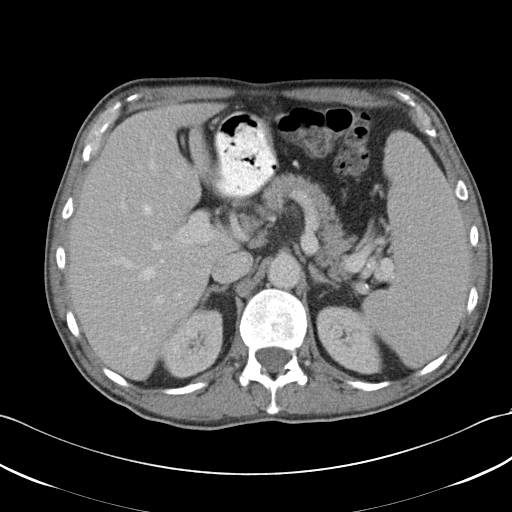
[im 67/85  lung]
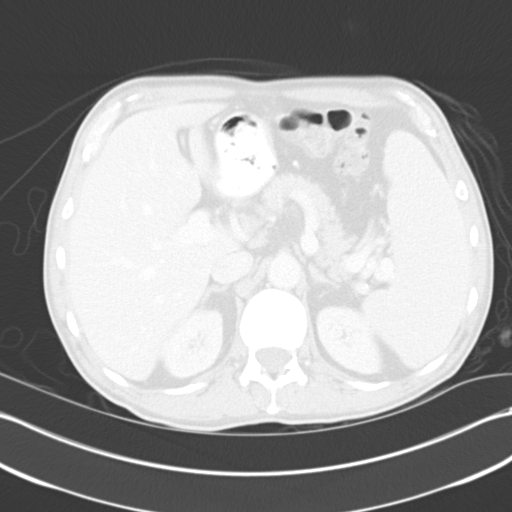
[im 71/85  soft-tissue]
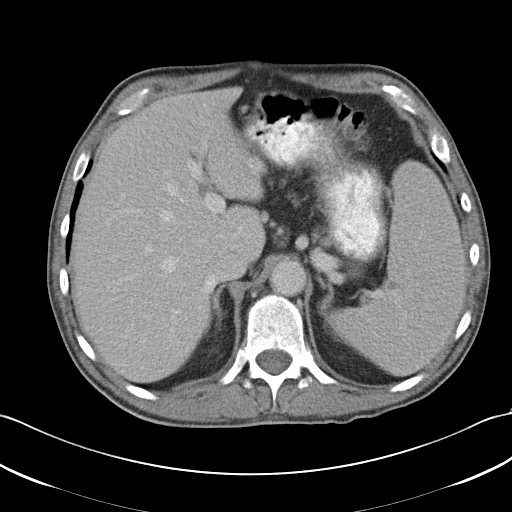
[im 71/85  lung]
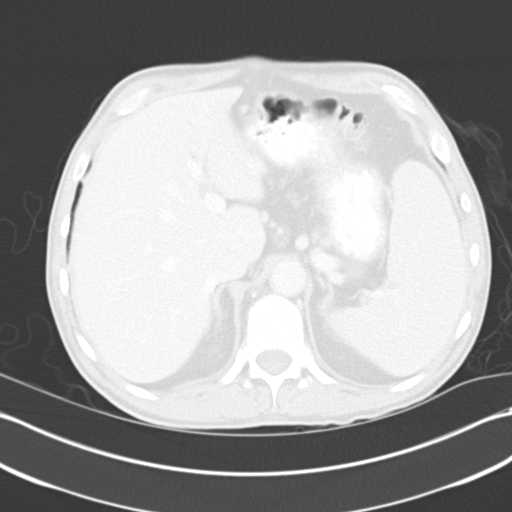
[im 76/85  lung]
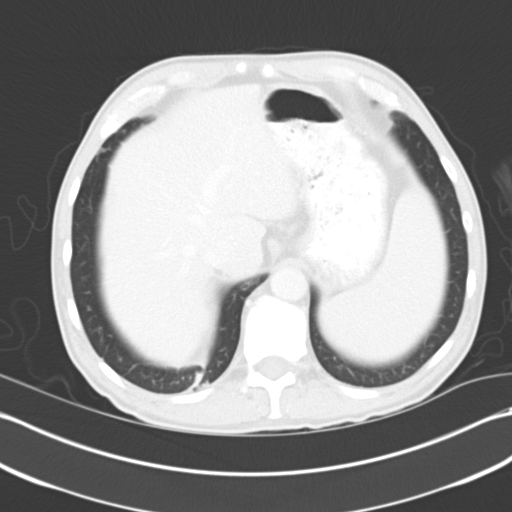
[im 80/85  soft-tissue]
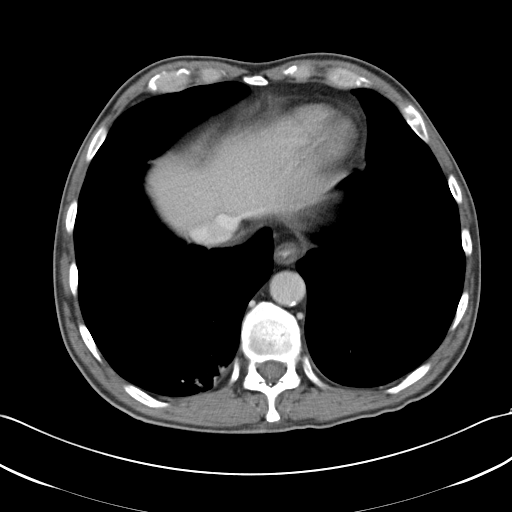
[im 80/85  lung]
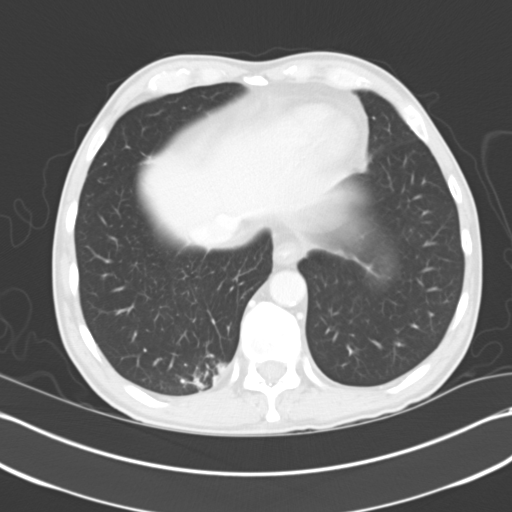

[14 of 32 positions shown; findings below may reference images not displayed]

FINDINGS: Images which include the lung bases show a focal
subpleural airspace disease in the right lower lobe, not
substantially changed in the interval since the prior chest CT.

No focal abnormalities seen in the liver or spleen.  The stomach,
duodenum, pancreas,  and adrenal glands are unremarkable.
Gallbladder is decompressed.  Tiny cortical cysts are noted in both
kidneys.

No abdominal aortic aneurysm.  No evidence for abdominal
lymphadenopathy.  There is no free fluid in the abdomen.

Imaging through the pelvis shows no free fluid.  No pelvic sidewall
lymphadenopathy.  The bladder is normal.  Prostate gland is
enlarged.  There is some diverticulosis in the sigmoid colon
without diverticulitis.  Terminal ileum is normal.  The appendix is
normal.

Bone windows reveal no worrisome lytic or sclerotic osseous
lesions.  Bilateral pars interarticularis defects are seen at the
L5 level.
IMPRESSION: No acute findings in the abdomen or pelvis.  Specifically, no
evidence for neoplasm or lymphadenopathy.  No evidence for
metastatic disease.

Images which include the lung bases show no substantial change in
the subpleural airspace disease in the right lower lobe comparing
back to recent chest CT.

## 2013-01-20 ENCOUNTER — Ambulatory Visit (INDEPENDENT_AMBULATORY_CARE_PROVIDER_SITE_OTHER): Payer: BC Managed Care – PPO | Admitting: Pulmonary Disease

## 2013-01-20 ENCOUNTER — Encounter: Payer: Self-pay | Admitting: Pulmonary Disease

## 2013-01-20 VITALS — BP 130/62 | HR 106 | Temp 98.4°F | Ht 71.0 in | Wt 154.0 lb

## 2013-01-20 DIAGNOSIS — J449 Chronic obstructive pulmonary disease, unspecified: Secondary | ICD-10-CM

## 2013-01-20 NOTE — Patient Instructions (Addendum)
COPD is stable We discussed pulmonary rehab Breathing test next visit

## 2013-01-20 NOTE — Progress Notes (Signed)
  Subjective:    Patient ID: George Marshall, male    DOB: June 03, 1951, 62 y.o.   MRN: [id number]  HPI  PCP - [name] ' [place], [name]  Heme [name]   61/M, ex smoker , quit '02 for FU of Gold B COPD.  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 RUL segmental consolidation & RLL patchy infiltrate.  WC 1.3 on 5/31 -improved subsequently ? Felty's syndrome  Spirometry showed moderate airway obstruction with FEv1 56%   07/2012 Spirometry continues to show moderate obstruction with FEv1 53%  01/20/2013 73m FU  On humira every 2 weeks & Mtx  - dose has been decreased Arthritis is well controlled  Denies dyspnea, CP Continues to chew tobacco   Review of Systems neg for any significant sore throat, dysphagia, itching, sneezing, nasal congestion or excess/ purulent secretions, fever, chills, sweats, unintended wt loss, pleuritic or exertional cp, hempoptysis, orthopnea pnd or change in chronic leg swelling. Also denies presyncope, palpitations, heartburn, abdominal pain, nausea, vomiting, diarrhea or change in bowel or urinary habits, dysuria,hematuria, rash, arthralgias, visual complaints, headache, numbness weakness or ataxia.     Objective:   Physical Exam  Gen. Pleasant, well-nourished, in no distress ENT - no lesions, no post nasal drip Neck: No JVD, no thyromegaly, no carotid bruits Lungs: no use of accessory muscles, no dullness to percussion, clear without rales or rhonchi  Cardiovascular: Rhythm regular, heart sounds  normal, no murmurs or gallops, no peripheral  edema Musculoskeletal: No deformities, no cyanosis or clubbing        Assessment & Plan:

## 2013-01-21 NOTE — Assessment & Plan Note (Signed)
2/14 FEV1 53%, quit smoking '02 - COPD is stable   We discussed pulmonary rehab Spirometry next visit prior to fireman  Physical Discussed tobacco cessation

## 2013-04-02 ENCOUNTER — Ambulatory Visit (INDEPENDENT_AMBULATORY_CARE_PROVIDER_SITE_OTHER): Payer: BC Managed Care – PPO

## 2013-04-02 DIAGNOSIS — Z23 Encounter for immunization: Secondary | ICD-10-CM

## 2013-05-12 ENCOUNTER — Encounter: Payer: Self-pay | Admitting: Pulmonary Disease

## 2013-05-12 ENCOUNTER — Ambulatory Visit (INDEPENDENT_AMBULATORY_CARE_PROVIDER_SITE_OTHER): Payer: BC Managed Care – PPO | Admitting: Pulmonary Disease

## 2013-05-12 VITALS — BP 134/88 | HR 101 | Temp 98.1°F | Ht 71.0 in | Wt 153.0 lb

## 2013-05-12 DIAGNOSIS — J449 Chronic obstructive pulmonary disease, unspecified: Secondary | ICD-10-CM

## 2013-05-12 NOTE — Assessment & Plan Note (Signed)
More or less stable lung function No work restrictions Advised to dc chewing Wishes to avoid bronchodilators & I think that is ok since asymptomatic

## 2013-05-12 NOTE — Progress Notes (Signed)
  Subjective:    Patient ID: George Marshall, male    DOB: [birth year], 62 y.o.   MRN: [id number]  HPI PCP - [name] ' [place], [name]  Heme [name]   62/M, ex smoker , quit '02 for FU of Gold B COPD.  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 RUL segmental consolidation & RLL patchy infiltrate.  WC 1.3 on 5/31 -improved subsequently ? Felty's syndrome  Spirometry showed moderate airway obstruction with FEv1 56%  07/2012 Spirometry continues to show moderate obstruction with FEv1 53%   05/12/2013 Remains On humira every 2 weeks , Off Mtx now Arthritis is well controlled  Denies dyspnea, CP  Continues to chew tobacco  FEv1 slight drop from 2.02 to 1.94- 51% Is due for fireman physical    Review of Systems neg for any significant sore throat, dysphagia, itching, sneezing, nasal congestion or excess/ purulent secretions, fever, chills, sweats, unintended wt loss, pleuritic or exertional cp, hempoptysis, orthopnea pnd or change in chronic leg swelling. Also denies presyncope, palpitations, heartburn, abdominal pain, nausea, vomiting, diarrhea or change in bowel or urinary habits, dysuria,hematuria, rash, arthralgias, visual complaints, headache, numbness weakness or ataxia.     Objective:   Physical Exam  Gen. Pleasant, well-nourished, in no distress ENT - no lesions, no post nasal drip Neck: No JVD, no thyromegaly, no carotid bruits Lungs: no use of accessory muscles, no dullness to percussion, clear without rales or rhonchi  Cardiovascular: Rhythm regular, heart sounds  normal, no  murmurs or gallops, no peripheral edema Musculoskeletal: No deformities, no cyanosis or clubbing        Assessment & Plan:

## 2013-05-12 NOTE — Patient Instructions (Signed)
Lung function is dropping slightly

## 2013-08-10 ENCOUNTER — Encounter: Payer: Self-pay | Admitting: Family Medicine

## 2013-08-10 ENCOUNTER — Ambulatory Visit (INDEPENDENT_AMBULATORY_CARE_PROVIDER_SITE_OTHER): Payer: BC Managed Care – PPO | Admitting: Family Medicine

## 2013-08-10 VITALS — BP 149/83 | HR 91 | Temp 98.8°F | Resp 18 | Ht 71.0 in | Wt 157.0 lb

## 2013-08-10 DIAGNOSIS — H6120 Impacted cerumen, unspecified ear: Secondary | ICD-10-CM

## 2013-08-10 DIAGNOSIS — J069 Acute upper respiratory infection, unspecified: Secondary | ICD-10-CM | POA: Insufficient documentation

## 2013-08-10 NOTE — Progress Notes (Signed)
Pre visit review using our clinic review tool, if applicable. No additional management support is needed unless otherwise documented below in the visit note. 

## 2013-08-10 NOTE — Progress Notes (Signed)
OFFICE NOTE  08/10/2013  CC:  Chief Complaint  Patient presents with  . Cough    x 3 - 4 days  . nasal drainage    HPI: Patient is a 63 y.o. Caucasian male who is here for cough.  Happily reports that Humira is helping a lot with his rh arth.  Onset 3-4 d ago nasal drainage, cough, some mild HA in frontal region but nothing persistent. A little tired, no aching but says "I don't feel sick".  Describes lots of PND, some morning ST that goes away w/in hours.  Some raspy voice.  No fever or wheezing or SOB. Has tried nyquil hs and it helps him sleep. Says most recent CBC 06/2013 via rheum office was normal per his report.   Pertinent PMH:  Past medical, surgical, social, and family history reviewed and no changes are noted since last office visit.  MEDS:  Outpatient Prescriptions Prior to Visit  Medication Sig Dispense Refill  . HUMIRA PEN 40 MG/0.8ML injection        No facility-administered medications prior to visit.    PE: Blood pressure 149/83, pulse 91, temperature 98.8 F (37.1 C), temperature source Temporal, resp. rate 18, height 5\' 11"  (1.803 m), weight 157 lb (71.215 kg), SpO2 100.00%. VS: noted--normal. Gen: alert, NAD, WELL- APPEARING. HEENT: eyes without injection, drainage, or swelling.  Ears: EACs completely full of yellow cerumen bilat. TMs not visible. Nose: Clear rhinorrhea, with some dried, crusty exudate adherent to mildly injected mucosa.  No purulent d/c.  No paranasal sinus TTP.  No facial swelling.  Throat and mouth without focal lesion.  No pharyngial swelling, erythema, or exudate.   Neck: supple, no LAD.   LUNGS: CTA bilat, nonlabored resps.   CV: RRR, no m/r/g. EXT: no c/c/e SKIN: no rash  LAB: none  IMPRESSION AND PLAN:  1) Viral URI, Trial of mucinex DM or robitussin DM otc as directed on the box. May use OTC nasal saline spray or irrigation solution bid. Push fluids and rest. Call if not improved any in 1 wk.  2) Cerumen impaction:  irrigation by nurse today.  Cleared minimally, but enough so that he may begin OTC debrox drops daily.  TMs still could not be viewed today.  FOLLOW UP: prn

## 2013-08-10 NOTE — Patient Instructions (Signed)
Buy OTC Mucinex DM max strength and take as directed on packaging. Buy OTC generic saline nasal spray, use 2-3 sprays each nostril several times a day. Drink plenty of water and gatorade. Rest!

## 2013-08-24 ENCOUNTER — Other Ambulatory Visit: Payer: Self-pay

## 2013-08-24 ENCOUNTER — Encounter (HOSPITAL_BASED_OUTPATIENT_CLINIC_OR_DEPARTMENT_OTHER): Payer: Self-pay | Admitting: Emergency Medicine

## 2013-08-24 ENCOUNTER — Emergency Department (HOSPITAL_BASED_OUTPATIENT_CLINIC_OR_DEPARTMENT_OTHER)
Admission: EM | Admit: 2013-08-24 | Discharge: 2013-08-24 | Disposition: A | Discharge status: Home / Self Care | Payer: BC Managed Care – PPO | Attending: Emergency Medicine | Admitting: Emergency Medicine

## 2013-08-24 ENCOUNTER — Emergency Department (HOSPITAL_BASED_OUTPATIENT_CLINIC_OR_DEPARTMENT_OTHER): Payer: BC Managed Care – PPO

## 2013-08-24 DIAGNOSIS — Z8781 Personal history of (healed) traumatic fracture: Secondary | ICD-10-CM | POA: Insufficient documentation

## 2013-08-24 DIAGNOSIS — Z8639 Personal history of other endocrine, nutritional and metabolic disease: Secondary | ICD-10-CM | POA: Insufficient documentation

## 2013-08-24 DIAGNOSIS — R142 Eructation: Principal | ICD-10-CM | POA: Insufficient documentation

## 2013-08-24 DIAGNOSIS — J4489 Other specified chronic obstructive pulmonary disease: Secondary | ICD-10-CM | POA: Insufficient documentation

## 2013-08-24 DIAGNOSIS — R141 Gas pain: Secondary | ICD-10-CM | POA: Insufficient documentation

## 2013-08-24 DIAGNOSIS — J449 Chronic obstructive pulmonary disease, unspecified: Secondary | ICD-10-CM | POA: Insufficient documentation

## 2013-08-24 DIAGNOSIS — Z9889 Other specified postprocedural states: Secondary | ICD-10-CM | POA: Insufficient documentation

## 2013-08-24 DIAGNOSIS — R143 Flatulence: Principal | ICD-10-CM

## 2013-08-24 DIAGNOSIS — Z8719 Personal history of other diseases of the digestive system: Secondary | ICD-10-CM | POA: Insufficient documentation

## 2013-08-24 DIAGNOSIS — Z862 Personal history of diseases of the blood and blood-forming organs and certain disorders involving the immune mechanism: Secondary | ICD-10-CM | POA: Insufficient documentation

## 2013-08-24 DIAGNOSIS — IMO0001 Reserved for inherently not codable concepts without codable children: Secondary | ICD-10-CM

## 2013-08-24 DIAGNOSIS — Z8739 Personal history of other diseases of the musculoskeletal system and connective tissue: Secondary | ICD-10-CM | POA: Insufficient documentation

## 2013-08-24 DIAGNOSIS — Z87891 Personal history of nicotine dependence: Secondary | ICD-10-CM | POA: Insufficient documentation

## 2013-08-24 LAB — CBC WITH DIFFERENTIAL/PLATELET
Basophils Absolute: 0 10*3/uL (ref 0.0–0.1)
Basophils Relative: 1 % (ref 0–1)
EOS PCT: 2 % (ref 0–5)
Eosinophils Absolute: 0.1 10*3/uL (ref 0.0–0.7)
HCT: 39.9 % (ref 39.0–52.0)
Hemoglobin: 14.1 g/dL (ref 13.0–17.0)
LYMPHS ABS: 1.8 10*3/uL (ref 0.7–4.0)
LYMPHS PCT: 49 % — AB (ref 12–46)
MCH: 30 pg (ref 26.0–34.0)
MCHC: 35.3 g/dL (ref 30.0–36.0)
MCV: 84.9 fL (ref 78.0–100.0)
Monocytes Absolute: 0.4 10*3/uL (ref 0.1–1.0)
Monocytes Relative: 12 % (ref 3–12)
Neutro Abs: 1.3 10*3/uL — ABNORMAL LOW (ref 1.7–7.7)
Neutrophils Relative %: 37 % — ABNORMAL LOW (ref 43–77)
PLATELETS: 151 10*3/uL (ref 150–400)
RBC: 4.7 MIL/uL (ref 4.22–5.81)
RDW: 14.9 % (ref 11.5–15.5)
WBC: 3.6 10*3/uL — AB (ref 4.0–10.5)

## 2013-08-24 LAB — COMPREHENSIVE METABOLIC PANEL
ALK PHOS: 98 U/L (ref 39–117)
ALT: 11 U/L (ref 0–53)
AST: 20 U/L (ref 0–37)
Albumin: 3.4 g/dL — ABNORMAL LOW (ref 3.5–5.2)
BILIRUBIN TOTAL: 1.5 mg/dL — AB (ref 0.3–1.2)
BUN: 9 mg/dL (ref 6–23)
CALCIUM: 8.1 mg/dL — AB (ref 8.4–10.5)
CO2: 28 mEq/L (ref 19–32)
Chloride: 104 mEq/L (ref 96–112)
Creatinine, Ser: 1 mg/dL (ref 0.50–1.35)
GFR calc non Af Amer: 80 mL/min — ABNORMAL LOW (ref >90)
GLUCOSE: 118 mg/dL — AB (ref 70–99)
POTASSIUM: 3.6 meq/L — AB (ref 3.7–5.3)
Sodium: 142 mEq/L (ref 137–147)
Total Protein: 6.4 g/dL (ref 6.0–8.3)

## 2013-08-24 LAB — TROPONIN I: Troponin I: <0.3 ng/mL (ref <0.30)

## 2013-08-24 LAB — LIPASE, BLOOD: Lipase: 33 U/L (ref 11–59)

## 2013-08-24 MED ORDER — OMEPRAZOLE 20 MG PO CPDR: 20.0000 mg | DELAYED_RELEASE_CAPSULE | Freq: Every day | ORAL | Status: DC

## 2013-08-24 MED ORDER — GI COCKTAIL ~~LOC~~
30.0000 mL | Freq: Once | ORAL | Status: AC
  Administered 2013-08-24: 30 mL via ORAL
  Filled 2013-08-24: qty 30

## 2013-08-24 MED ORDER — DICYCLOMINE HCL 10 MG/ML IM SOLN
20.0000 mg | Freq: Once | INTRAMUSCULAR | Status: AC
  Administered 2013-08-24: 20 mg via INTRAMUSCULAR
  Filled 2013-08-24: qty 2

## 2013-08-24 MED ORDER — SIMETHICONE 40 MG/0.6ML PO SUSP (UNIT DOSE)
40.0000 mg | Freq: Once | ORAL | Status: AC
  Administered 2013-08-24: 40 mg via ORAL
  Filled 2013-08-24 (×2): qty 0.6

## 2013-08-24 NOTE — Discharge Instructions (Signed)
Flatulence There are good germs in your gut to help you digest food. Gas is produced by these germs and released from your bottom. Most people release 3 to 4 quarts of gas every day. This is normal. HOME CARE  Eat or drink less of the foods or liquids that give you gas.  Take the time to chew your food well. Talk less while you eat.  Do not suck on ice or hard candy.  Sip slowly. Stir some of the bubbles out of fizzy drinks with a spoon or straw.  Avoid chewing gum or smoking.  Ask your doctor about liquids and tablets that may help control burping and gas.  Only take medicine as told by your doctor. GET HELP RIGHT AWAY IF:   There is discomfort when you burp or pass gas.  You throw up (vomit) when you burp.  Poop (stool) comes out when you pass gas.  Your belly is puffy (swollen) and hard. MAKE SURE YOU:   Understand these instructions.  Will watch your condition.  Will get help right away if you are not doing well or get worse. Document Released: 04/06/2008 Document Revised: 08/27/2011 Document Reviewed: 04/06/2008 [facility] Patient Information 2014 [place], [place].

## 2013-08-24 NOTE — ED Notes (Signed)
Report received from [employer]

## 2013-08-24 NOTE — ED Provider Notes (Signed)
CSN: [id number]     Arrival date & time 08/24/13  0554 History   First MD Initiated Contact with Patient 08/24/13 0615     Chief Complaint  Patient presents with  . Abdominal Pain     (Consider location/radiation/quality/duration/timing/severity/associated sxs/prior Treatment) Patient is a 63 y.o. male presenting with abdominal pain. The history is provided by the patient.  Abdominal Pain Pain location:  Epigastric Pain quality: cramping   Pain radiates to:  Does not radiate Pain severity:  Mild Onset quality:  Gradual Duration:  7 hours Timing:  Constant Progression:  Unchanged Context: eating   Context: not alcohol use   Context comment:  Butter beans and steak Relieved by:  Nothing Worsened by:  Nothing tried Ineffective treatments:  None tried Associated symptoms: no chest pain, no cough, no diarrhea, no dysuria, no fever, no flatus, no shortness of breath and no vomiting   Risk factors: being elderly   Says it feels like his previous bouts of gas but it usually goes away much faster.  No CP no SOB no DOE.  No radiation.  No f/c/r   Past Medical History  Diagnosis Date  . Hyperlipidemia   . GERD (gastroesophageal reflux disease)   . History of syncope 12/2004  . Neutropenia 11/08/2010  . B12 deficiency 11/08/2010  . Iron deficiency 11/08/2010  . Rheumatoid arthritis(714.0) 10/2010  . COPD (chronic obstructive pulmonary disease)     Dr. Elsworth Soho  . Fracture of great toe, right, closed 09/2012    Nondisplaced.  Buddy taped, post-op shoe, ortho eval was done s/p initial dx in urgent care center.   Past Surgical History  Procedure Laterality Date  . Inguinal hernia repair      age 68   Family History  Problem Relation Age of Onset  . Hypertension      parents late 8's  . Cancer Mother     breast and cervical   History  Substance Use Topics  . Smoking status: Former Smoker -- 1.50 packs/day for 30 years    Types: Cigarettes    Quit date: 06/18/2000  . Smokeless  tobacco: Current User    Types: Chew  . Alcohol Use: No    Review of Systems  Constitutional: Negative for fever.  Respiratory: Negative for cough and shortness of breath.   Cardiovascular: Negative for chest pain.  Gastrointestinal: Positive for abdominal pain. Negative for vomiting, diarrhea and flatus.  Genitourinary: Negative for dysuria.  All other systems reviewed and are negative.      Allergies  Review of patient's allergies indicates no known allergies.  Home Medications   Current Outpatient Rx  Name  Route  Sig  Dispense  Refill  . HUMIRA PEN 40 MG/0.8ML injection                BP 143/80  Pulse 71  Temp(Src) 97.6 F (36.4 C) (Oral)  Resp 20  Ht 5\' 11"  (1.803 m)  Wt 157 lb (71.215 kg)  BMI 21.91 kg/m2  SpO2 99% Physical Exam  Constitutional: He is oriented to person, place, and time. He appears well-developed and well-nourished.  Laughing an joking  HENT:  Head: Normocephalic and atraumatic.  Mouth/Throat: Oropharynx is clear and moist.  Eyes: Conjunctivae are normal. Pupils are equal, round, and reactive to light.  Neck: Normal range of motion. Neck supple.  Cardiovascular: Normal rate, regular rhythm and intact distal pulses.   Pulmonary/Chest: Effort normal and breath sounds normal. He has no wheezes. He has no rales.  Abdominal: Soft. He exhibits no mass. Bowel sounds are increased. There is no tenderness. There is no rigidity, no rebound, no guarding, no tenderness at McBurney's point and negative Murphy's sign.  Gassy throughout  Musculoskeletal: Normal range of motion.  Neurological: He is alert and oriented to person, place, and time.  Skin: Skin is warm and dry.  Psychiatric: He has a normal mood and affect.    ED Course  Procedures (including critical care time) Labs Review Labs Reviewed  CBC WITH DIFFERENTIAL  COMPREHENSIVE METABOLIC PANEL  LIPASE, BLOOD  TROPONIN I   Imaging Review No results found.   EKG  Interpretation None      MDM   Final diagnoses:  None  Doubt cardiac etiology.  Patient has no pain in the chest and points to where gas is the loudest in the abdomen.  EKG and troponin are negative.     Date: 08/24/2013  Rate: 73  Rhythm: normal sinus rhythm  QRS Axis: normal  Intervals: normal  ST/T Wave abnormalities: normal  Conduction Disutrbances: none  Narrative Interpretation: unremarkable  Exam is consistent with gas, Patient is painfree post medication.  Will start GERD meds stay away from gas causing foods.  Follow up with your family doctor for ongoing care      Dominika Losey K Zamar Odwyer-Rasch, MD 08/24/13 [phone]

## 2013-08-24 NOTE — ED Notes (Addendum)
C/o mid upper abd pain that started around midnight. Describes as dull. States pain worse while lying. Denies cp/sob/nausea. Denies any fevers. resp even and unlabored. Denies any heart hx. Denies any fevers. Denies any vomiting

## 2013-08-24 NOTE — ED Provider Notes (Signed)
Humira is known to cause elevated LFTs and bilirubin have counseled patient to have labs rechecked in one week with PMD. Strict return precautions given.    Carlisle Beers, MD 08/24/13 [phone]

## 2013-08-24 NOTE — ED Notes (Signed)
Transported to xray 

## 2013-08-24 NOTE — ED Notes (Signed)
Returned from xray

## 2013-09-06 ENCOUNTER — Encounter (HOSPITAL_BASED_OUTPATIENT_CLINIC_OR_DEPARTMENT_OTHER): Payer: Self-pay | Admitting: Emergency Medicine

## 2013-09-06 DIAGNOSIS — Z79899 Other long term (current) drug therapy: Secondary | ICD-10-CM

## 2013-09-06 DIAGNOSIS — K8309 Other cholangitis: Secondary | ICD-10-CM | POA: Diagnosis present

## 2013-09-06 DIAGNOSIS — Z8249 Family history of ischemic heart disease and other diseases of the circulatory system: Secondary | ICD-10-CM

## 2013-09-06 DIAGNOSIS — E785 Hyperlipidemia, unspecified: Secondary | ICD-10-CM | POA: Diagnosis present

## 2013-09-06 DIAGNOSIS — K8062 Calculus of gallbladder and bile duct with acute cholecystitis without obstruction: Principal | ICD-10-CM | POA: Diagnosis present

## 2013-09-06 DIAGNOSIS — K560 Paralytic ileus: Secondary | ICD-10-CM | POA: Diagnosis not present

## 2013-09-06 DIAGNOSIS — D709 Neutropenia, unspecified: Secondary | ICD-10-CM | POA: Diagnosis present

## 2013-09-06 DIAGNOSIS — Z87891 Personal history of nicotine dependence: Secondary | ICD-10-CM

## 2013-09-06 DIAGNOSIS — R17 Unspecified jaundice: Secondary | ICD-10-CM | POA: Diagnosis not present

## 2013-09-06 DIAGNOSIS — J4489 Other specified chronic obstructive pulmonary disease: Secondary | ICD-10-CM | POA: Diagnosis present

## 2013-09-06 DIAGNOSIS — J449 Chronic obstructive pulmonary disease, unspecified: Secondary | ICD-10-CM | POA: Diagnosis present

## 2013-09-06 DIAGNOSIS — Z5331 Laparoscopic surgical procedure converted to open procedure: Secondary | ICD-10-CM

## 2013-09-06 DIAGNOSIS — M069 Rheumatoid arthritis, unspecified: Secondary | ICD-10-CM | POA: Diagnosis present

## 2013-09-06 DIAGNOSIS — K219 Gastro-esophageal reflux disease without esophagitis: Secondary | ICD-10-CM | POA: Diagnosis present

## 2013-09-06 NOTE — ED Notes (Signed)
abd pain, back pain, vomited x 2, chills since 10pm

## 2013-09-07 ENCOUNTER — Inpatient Hospital Stay (HOSPITAL_BASED_OUTPATIENT_CLINIC_OR_DEPARTMENT_OTHER)
Admission: EM | Admit: 2013-09-07 | Discharge: 2013-09-14 | DRG: 415 | Disposition: A | Discharge status: Home / Self Care | Payer: BC Managed Care – PPO | Attending: General Surgery | Admitting: General Surgery

## 2013-09-07 ENCOUNTER — Encounter (HOSPITAL_COMMUNITY): Payer: Self-pay | Admitting: Emergency Medicine

## 2013-09-07 ENCOUNTER — Emergency Department (HOSPITAL_BASED_OUTPATIENT_CLINIC_OR_DEPARTMENT_OTHER): Payer: BC Managed Care – PPO

## 2013-09-07 ENCOUNTER — Emergency Department (HOSPITAL_COMMUNITY): Payer: BC Managed Care – PPO

## 2013-09-07 DIAGNOSIS — K8020 Calculus of gallbladder without cholecystitis without obstruction: Secondary | ICD-10-CM

## 2013-09-07 DIAGNOSIS — D709 Neutropenia, unspecified: Secondary | ICD-10-CM | POA: Diagnosis present

## 2013-09-07 DIAGNOSIS — E611 Iron deficiency: Secondary | ICD-10-CM | POA: Diagnosis present

## 2013-09-07 DIAGNOSIS — K8043 Calculus of bile duct with acute cholecystitis with obstruction: Secondary | ICD-10-CM

## 2013-09-07 DIAGNOSIS — J449 Chronic obstructive pulmonary disease, unspecified: Secondary | ICD-10-CM | POA: Diagnosis present

## 2013-09-07 DIAGNOSIS — M069 Rheumatoid arthritis, unspecified: Secondary | ICD-10-CM | POA: Diagnosis present

## 2013-09-07 DIAGNOSIS — K219 Gastro-esophageal reflux disease without esophagitis: Secondary | ICD-10-CM | POA: Diagnosis present

## 2013-09-07 DIAGNOSIS — R109 Unspecified abdominal pain: Secondary | ICD-10-CM

## 2013-09-07 DIAGNOSIS — K8001 Calculus of gallbladder with acute cholecystitis with obstruction: Secondary | ICD-10-CM | POA: Diagnosis present

## 2013-09-07 DIAGNOSIS — E538 Deficiency of other specified B group vitamins: Secondary | ICD-10-CM | POA: Diagnosis present

## 2013-09-07 HISTORY — DX: Pneumonia, unspecified organism: J18.9

## 2013-09-07 HISTORY — DX: Displaced unspecified fracture of right lesser toe(s), initial encounter for closed fracture: S92.501A

## 2013-09-07 LAB — URINALYSIS, ROUTINE W REFLEX MICROSCOPIC
GLUCOSE, UA: NEGATIVE mg/dL
HGB URINE DIPSTICK: NEGATIVE
Ketones, ur: 15 mg/dL — AB
LEUKOCYTES UA: NEGATIVE
Nitrite: NEGATIVE
PROTEIN: NEGATIVE mg/dL
SPECIFIC GRAVITY, URINE: 1.045 — AB (ref 1.005–1.030)
UROBILINOGEN UA: 4 mg/dL — AB (ref 0.0–1.0)
pH: 8.5 — ABNORMAL HIGH (ref 5.0–8.0)

## 2013-09-07 LAB — COMPREHENSIVE METABOLIC PANEL
ALT: 55 U/L — AB (ref 0–53)
AST: 130 U/L — ABNORMAL HIGH (ref 0–37)
Albumin: 3.7 g/dL (ref 3.5–5.2)
Alkaline Phosphatase: 177 U/L — ABNORMAL HIGH (ref 39–117)
BUN: 12 mg/dL (ref 6–23)
CALCIUM: 8.7 mg/dL (ref 8.4–10.5)
CO2: 28 meq/L (ref 19–32)
CREATININE: 0.9 mg/dL (ref 0.50–1.35)
Chloride: 102 mEq/L (ref 96–112)
Glucose, Bld: 136 mg/dL — ABNORMAL HIGH (ref 70–99)
Potassium: 3.7 mEq/L (ref 3.7–5.3)
SODIUM: 143 meq/L (ref 137–147)
TOTAL PROTEIN: 7.4 g/dL (ref 6.0–8.3)
Total Bilirubin: 4.8 mg/dL — ABNORMAL HIGH (ref 0.3–1.2)

## 2013-09-07 LAB — CBC WITH DIFFERENTIAL/PLATELET
BASOS ABS: 0 10*3/uL (ref 0.0–0.1)
Basophils Relative: 1 % (ref 0–1)
EOS ABS: 0 10*3/uL (ref 0.0–0.7)
EOS PCT: 1 % (ref 0–5)
HCT: 41.6 % (ref 39.0–52.0)
Hemoglobin: 14.7 g/dL (ref 13.0–17.0)
Lymphocytes Relative: 24 % (ref 12–46)
Lymphs Abs: 0.5 10*3/uL — ABNORMAL LOW (ref 0.7–4.0)
MCH: 30.2 pg (ref 26.0–34.0)
MCHC: 35.3 g/dL (ref 30.0–36.0)
MCV: 85.4 fL (ref 78.0–100.0)
MONO ABS: 0.4 10*3/uL (ref 0.1–1.0)
Monocytes Relative: 17 % — ABNORMAL HIGH (ref 3–12)
Neutro Abs: 1.3 10*3/uL — ABNORMAL LOW (ref 1.7–7.7)
Neutrophils Relative %: 58 % (ref 43–77)
Platelets: 152 10*3/uL (ref 150–400)
RBC: 4.87 MIL/uL (ref 4.22–5.81)
RDW: 14.2 % (ref 11.5–15.5)
WBC: 2.2 10*3/uL — ABNORMAL LOW (ref 4.0–10.5)

## 2013-09-07 LAB — PROTIME-INR
INR: 1.1 (ref 0.00–1.49)
Prothrombin Time: 14 seconds (ref 11.6–15.2)

## 2013-09-07 LAB — LIPASE, BLOOD: Lipase: 36 U/L (ref 11–59)

## 2013-09-07 LAB — APTT: APTT: 29 s (ref 24–37)

## 2013-09-07 MED ORDER — SODIUM CHLORIDE 0.9 % IV SOLN
3.0000 g | Freq: Four times a day (QID) | INTRAVENOUS | Status: DC
  Administered 2013-09-07 – 2013-09-14 (×27): 3 g via INTRAVENOUS
  Filled 2013-09-07 (×31): qty 3

## 2013-09-07 MED ORDER — ONDANSETRON HCL 4 MG/2ML IJ SOLN
4.0000 mg | Freq: Four times a day (QID) | INTRAMUSCULAR | Status: DC | PRN
Start: 2013-09-07 — End: 2013-09-14

## 2013-09-07 MED ORDER — ACETAMINOPHEN 325 MG PO TABS: 650.0000 mg | ORAL_TABLET | Freq: Four times a day (QID) | ORAL | Status: DC | PRN

## 2013-09-07 MED ORDER — FENTANYL CITRATE 0.05 MG/ML IJ SOLN
100.0000 ug | Freq: Once | INTRAMUSCULAR | Status: AC
Start: 2013-09-07 — End: 2013-09-07
  Administered 2013-09-07: 100 ug via INTRAVENOUS
  Filled 2013-09-07: qty 2

## 2013-09-07 MED ORDER — OXYCODONE HCL 5 MG PO TABS
5.0000 mg | ORAL_TABLET | ORAL | Status: DC | PRN
  Administered 2013-09-07: 5 mg via ORAL
  Administered 2013-09-12 – 2013-09-14 (×9): 10 mg via ORAL
  Filled 2013-09-07 (×2): qty 2
  Filled 2013-09-07: qty 1
  Filled 2013-09-07 (×7): qty 2

## 2013-09-07 MED ORDER — METOCLOPRAMIDE HCL 5 MG/ML IJ SOLN
10.0000 mg | Freq: Once | INTRAMUSCULAR | Status: AC
  Administered 2013-09-07: 10 mg via INTRAVENOUS
  Filled 2013-09-07: qty 2

## 2013-09-07 MED ORDER — FENTANYL CITRATE 0.05 MG/ML IJ SOLN
100.0000 ug | Freq: Once | INTRAMUSCULAR | Status: AC
  Administered 2013-09-07: 100 ug via INTRAVENOUS
  Filled 2013-09-07: qty 2

## 2013-09-07 MED ORDER — POTASSIUM CHLORIDE IN NACL 20-0.9 MEQ/L-% IV SOLN
INTRAVENOUS | Status: DC
  Administered 2013-09-07 – 2013-09-14 (×11): via INTRAVENOUS
  Filled 2013-09-07 (×17): qty 1000

## 2013-09-07 MED ORDER — HYDROMORPHONE HCL PF 1 MG/ML IJ SOLN
0.5000 mg | INTRAMUSCULAR | Status: DC | PRN
  Administered 2013-09-07 – 2013-09-09 (×3): 0.5 mg via INTRAVENOUS
  Administered 2013-09-10 – 2013-09-12 (×11): 1 mg via INTRAVENOUS
  Filled 2013-09-07 (×14): qty 1

## 2013-09-07 MED ORDER — IOHEXOL 300 MG/ML  SOLN
100.0000 mL | Freq: Once | INTRAMUSCULAR | Status: AC | PRN
  Administered 2013-09-07: 100 mL via INTRAVENOUS

## 2013-09-07 MED ORDER — IOHEXOL 300 MG/ML  SOLN: 50.0000 mL | Freq: Once | INTRAMUSCULAR | Status: AC | PRN

## 2013-09-07 MED ORDER — PANTOPRAZOLE SODIUM 40 MG IV SOLR
40.0000 mg | Freq: Two times a day (BID) | INTRAVENOUS | Status: DC
  Administered 2013-09-07 – 2013-09-12 (×9): 40 mg via INTRAVENOUS
  Filled 2013-09-07 (×12): qty 40

## 2013-09-07 MED ORDER — ONDANSETRON HCL 4 MG/2ML IJ SOLN
4.0000 mg | Freq: Once | INTRAMUSCULAR | Status: AC
  Administered 2013-09-07: 4 mg via INTRAVENOUS
  Filled 2013-09-07: qty 2

## 2013-09-07 MED ORDER — ACETAMINOPHEN 650 MG RE SUPP: 650.0000 mg | Freq: Four times a day (QID) | RECTAL | Status: DC | PRN

## 2013-09-07 MED ORDER — SODIUM CHLORIDE 0.9 % IV SOLN
20.0000 mL | INTRAVENOUS | Status: DC
  Administered 2013-09-07: 20 mL via INTRAVENOUS

## 2013-09-07 MED ORDER — SODIUM CHLORIDE 0.9 % IV SOLN
INTRAVENOUS | Status: DC
  Administered 2013-09-07: 02:00:00 via INTRAVENOUS

## 2013-09-07 MED ORDER — IOHEXOL 300 MG/ML  SOLN
50.0000 mL | Freq: Once | INTRAMUSCULAR | Status: AC | PRN
  Administered 2013-09-07: 50 mL via ORAL

## 2013-09-07 NOTE — ED Provider Notes (Addendum)
CSN: [id number]     Arrival date & time 09/06/13  2313 History   First MD Initiated Contact with Patient 09/07/13 0142     Chief Complaint  Patient presents with  . Abdominal Pain     (Consider location/radiation/quality/duration/timing/severity/associated sxs/prior Treatment) HPI This is a 63 year old male with abdominal pain since about 9:30 yesterday evening. The pain is located in the epigastrium and radiates into his back. He describes it as feeling like severe gas pain. It is somewhat worse with palpation or movement. He has been nauseated and has vomited twice. He has had chills but no fever. He denies diarrhea or constipation. He denies urinary changes. He has a history of rheumatoid arthritis for which he is on Humira and has a history of chronic neutropenia. He was seen here March 9 and had an unremarkable workup. It is noted that his LFTs have changed since that time.  Past Medical History  Diagnosis Date  . Hyperlipidemia   . GERD (gastroesophageal reflux disease)   . History of syncope 12/2004  . Neutropenia 11/08/2010  . B12 deficiency 11/08/2010  . Iron deficiency 11/08/2010  . Rheumatoid arthritis(714.0) 10/2010  . COPD (chronic obstructive pulmonary disease)     Dr. Elsworth Soho  . Fracture of great toe, right, closed 09/2012    Nondisplaced.  Buddy taped, post-op shoe, ortho eval was done s/p initial dx in urgent care center.   Past Surgical History  Procedure Laterality Date  . Inguinal hernia repair      age 62   Family History  Problem Relation Age of Onset  . Hypertension      parents late 72's  . Cancer Mother     breast and cervical   History  Substance Use Topics  . Smoking status: Former Smoker -- 1.50 packs/day for 30 years    Types: Cigarettes    Quit date: 06/18/2000  . Smokeless tobacco: Current User    Types: Chew  . Alcohol Use: No    Review of Systems  All other systems reviewed and are negative.   Allergies  Review of patient's allergies  indicates no known allergies.  Home Medications   Current Outpatient Rx  Name  Route  Sig  Dispense  Refill  . HUMIRA PEN 40 MG/0.8ML injection               . omeprazole (PRILOSEC) 20 MG capsule   Oral   Take 1 capsule (20 mg total) by mouth daily.   30 capsule   0    BP 120/61  Pulse 84  Temp(Src) 98.4 F (36.9 C) (Oral)  Resp 22  Ht 5\' 11"  (1.803 m)  Wt 156 lb (70.761 kg)  BMI 21.77 kg/m2  SpO2 100%  Physical Exam General: Well-developed, well-nourished male in no acute distress; appearance consistent with age of record HENT: normocephalic; atraumatic Eyes: pupils equal, round and reactive to light; extraocular muscles intact Neck: supple Heart: regular rate and rhythm; no murmurs, rubs or gallops Lungs: clear to auscultation bilaterally Abdomen: soft; nondistended; epigastric tenderness; negative Murphy's sign; no masses or hepatosplenomegaly; bowel sounds present; gallbladder not visualized with bedside ultrasound GU: dark urine Extremities: No deformity; full range of motion; pulses normal Neurologic: Awake, alert and oriented; motor function intact in all extremities and symmetric; no facial droop Skin: Warm and dry Psychiatric: Normal mood and affect    ED Course  Procedures (including critical care time) Labs Review   MDM   Nursing notes and vitals signs, including pulse  oximetry, reviewed.  Summary of this visit's results, reviewed by myself:  Labs:  Results for orders placed during the hospital encounter of 09/07/13 (from the past 24 hour(s))  CBC WITH DIFFERENTIAL     Status: Abnormal   Collection Time    09/07/13 12:45 AM      Result Value Ref Range   WBC 2.2 (*) 4.0 - 10.5 K/uL   RBC 4.87  4.22 - 5.81 MIL/uL   Hemoglobin 14.7  13.0 - 17.0 g/dL   HCT 41.6  39.0 - 52.0 %   MCV 85.4  78.0 - 100.0 fL   MCH 30.2  26.0 - 34.0 pg   MCHC 35.3  30.0 - 36.0 g/dL   RDW 14.2  11.5 - 15.5 %   Platelets 152  150 - 400 K/uL   Neutrophils Relative  % 58  43 - 77 %   Neutro Abs 1.3 (*) 1.7 - 7.7 K/uL   Lymphocytes Relative 24  12 - 46 %   Lymphs Abs 0.5 (*) 0.7 - 4.0 K/uL   Monocytes Relative 17 (*) 3 - 12 %   Monocytes Absolute 0.4  0.1 - 1.0 K/uL   Eosinophils Relative 1  0 - 5 %   Eosinophils Absolute 0.0  0.0 - 0.7 K/uL   Basophils Relative 1  0 - 1 %   Basophils Absolute 0.0  0.0 - 0.1 K/uL  COMPREHENSIVE METABOLIC PANEL     Status: Abnormal   Collection Time    09/07/13 12:45 AM      Result Value Ref Range   Sodium 143  137 - 147 mEq/L   Potassium 3.7  3.7 - 5.3 mEq/L   Chloride 102  96 - 112 mEq/L   CO2 28  19 - 32 mEq/L   Glucose, Bld 136 (*) 70 - 99 mg/dL   BUN 12  6 - 23 mg/dL   Creatinine, Ser 0.90  0.50 - 1.35 mg/dL   Calcium 8.7  8.4 - 10.5 mg/dL   Total Protein 7.4  6.0 - 8.3 g/dL   Albumin 3.7  3.5 - 5.2 g/dL   AST 130 (*) 0 - 37 U/L   ALT 55 (*) 0 - 53 U/L   Alkaline Phosphatase 177 (*) 39 - 117 U/L   Total Bilirubin 4.8 (*) 0.3 - 1.2 mg/dL   GFR calc non Af Amer >90  >90 mL/min   GFR calc Af Amer >90  >90 mL/min  LIPASE, BLOOD     Status: None   Collection Time    09/07/13 12:45 AM      Result Value Ref Range   Lipase 36  11 - 59 U/L  URINALYSIS, ROUTINE W REFLEX MICROSCOPIC     Status: Abnormal   Collection Time    09/07/13  4:41 AM      Result Value Ref Range   Color, Urine AMBER (*) YELLOW   APPearance CLEAR  CLEAR   Specific Gravity, Urine 1.045 (*) 1.005 - 1.030   pH 8.5 (*) 5.0 - 8.0   Glucose, UA NEGATIVE  NEGATIVE mg/dL   Hgb urine dipstick NEGATIVE  NEGATIVE   Bilirubin Urine SMALL (*) NEGATIVE   Ketones, ur 15 (*) NEGATIVE mg/dL   Protein, ur NEGATIVE  NEGATIVE mg/dL   Urobilinogen, UA 4.0 (*) 0.0 - 1.0 mg/dL   Nitrite NEGATIVE  NEGATIVE   Leukocytes, UA NEGATIVE  NEGATIVE    Imaging Studies: Ct Abdomen Pelvis W Contrast  09/07/2013   CLINICAL DATA:  Upper abdominal pain.  Vomiting.  EXAM: CT ABDOMEN AND PELVIS WITH CONTRAST  TECHNIQUE: Multidetector CT imaging of the abdomen and  pelvis was performed using the standard protocol following bolus administration of intravenous contrast.  CONTRAST:  25mL OMNIPAQUE IOHEXOL 300 MG/ML SOLN, 132mL OMNIPAQUE IOHEXOL 300 MG/ML SOLN  COMPARISON:  CT of the abdomen and pelvis 10/28/2010.  FINDINGS: Lung Bases: Unremarkable.  Abdomen/Pelvis: Image 33 of series 2 demonstrates a tiny focus of high attenuation in the distal common bile duct, concerning for ductal stone. There is also a small calcified gallstone lying dependently in the gallbladder. Gallbladder wall appears mildly thickened measuring approximately 4 mm. Haziness in the pericholecystic fat suggestive of inflammation. Common bile duct does not appear dilated, there is no definite intrahepatic biliary ductal dilatation at this time. The appearance of the liver, pancreas and bilateral adrenal glands is unremarkable. The spleen is enlarged measuring up to 14.4 x 7.0 x 12.3 cm (estimated splenic volume of 620 mL). Multiple small low-attenuation lesions throughout the kidneys bilaterally are too small to definitively characterize, but are similar to the prior examination, and favored to represent tiny cysts.  Atherosclerosis throughout the abdominal and pelvic vasculature, without evidence of aneurysm. Normal appendix. No significant volume of ascites. No pneumoperitoneum. No pathologic distention of small bowel. No definite lymphadenopathy identified within the abdomen or pelvis. Urinary bladder is unremarkable in appearance.  Musculoskeletal: There are no aggressive appearing lytic or blastic lesions noted in the visualized portions of the skeleton.  IMPRESSION: 1. The appearance of the gallbladder suggests early changes of acute cholecystitis. Clinical correlation is recommended, with consideration for further evaluation with right upper quadrant abdominal ultrasound if clinically appropriate. 2. In addition, there is a tiny focus of high attenuation within the distal common bile duct, which may  represent a ductal stone. No associated intra or extrahepatic biliary ductal dilatation is noted at this time to suggest acute obstruction. 3. Mild splenomegaly. 4. Atherosclerosis. 5. Additional incidental findings, as above.   Electronically Signed   By: Vinnie Langton M.D.   On: 09/07/2013 03:43   4:51 AM We'll transferred to [facility] [place] to obtain ultrasound to evaluate biliary tract.     Wynetta Fines, MD 09/07/13 0409  Wynetta Fines, MD 09/07/13 9449  Wynetta Fines, MD 09/07/13 6759

## 2013-09-07 NOTE — Care Management Note (Signed)
    Page 1 of 1   09/07/2013     11:39:05 AM   CARE MANAGEMENT NOTE 09/07/2013  Patient:  George Marshall, George Marshall   Account Number:  [id number]  Date Initiated:  09/07/2013  Documentation initiated by:  Sunday Spillers  Subjective/Objective Assessment:   63 yo male admitted with acute cholecystitis. PTA lived at home with girlfriend.     Action/Plan:   Home when stable   Anticipated DC Date:  09/10/2013   Anticipated DC Plan:  [place]  CM consult      Choice offered to / List presented to:             Status of service:  Completed, signed off Medicare Important Message given?  NA - LOS <3 / Initial given by admissions (If response is "NO", the following Medicare IM given date fields will be blank) Date Medicare IM given:   Date Additional Medicare IM given:    Discharge Disposition:  HOME/SELF CARE  Per UR Regulation:  Reviewed for med. necessity/level of care/duration of stay  If discussed at [place] of Stay Meetings, dates discussed:    Comments:

## 2013-09-07 NOTE — Consult Note (Signed)
Subjective:   HPI  The patient is a 63 year old male who was admitted to the hospital this morning with complaints of right upper quadrant abdominal pain. He had an abdominal ultrasound as well as CT scan of the abdomen. He was admitted with the diagnosis of acute cholecystitis. Gallstones were seen. There was suggestion on CT scan of a common bile duct stone. Liver enzymes are elevated with total bilirubin 4.8 alkaline phosphatase 177 ALT 55 AST 130. We are asked to see him in regards to ERCP. He is feeling much better at this time. He had some vomiting last night but none today. He was given her recent pain medication and his pain is much improved.  Review of Systems No chest pain or shortness of breath  Past Medical History  Diagnosis Date  . Hyperlipidemia   . GERD (gastroesophageal reflux disease)   . History of syncope 12/2004  . Neutropenia 11/08/2010  . B12 deficiency 11/08/2010  . Iron deficiency 11/08/2010  . Rheumatoid arthritis(714.0) 10/2010  . COPD (chronic obstructive pulmonary disease)     Dr. Elsworth Soho  . Fracture of great toe, right, closed 09/2012    Nondisplaced.  Buddy taped, post-op shoe, ortho eval was done s/p initial dx in urgent care center.   Past Surgical History  Procedure Laterality Date  . Inguinal hernia repair      age 29   History   Social History  . Marital Status: Divorced    Spouse Name: N/A    Number of Children: N/A  . Years of Education: N/A   Occupational History  . Not on file.   Social History Main Topics  . Smoking status: Former Smoker -- 1.50 packs/day for 30 years    Types: Cigarettes    Quit date: 06/18/2000  . Smokeless tobacco: Current User    Types: Chew  . Alcohol Use: No  . Drug Use: No  . Sexual Activity: Not on file   Other Topics Concern  . Not on file   Social History Narrative  . No narrative on file   family history includes Cancer in his mother; Hypertension in an other family member. Current facility-administered  medications:0.9 % NaCl with KCl 20 mEq/ L  infusion, , Intravenous, Continuous, Earnstine Regal, PA-C, Last Rate: 100 mL/hr at 09/07/13 8338;  acetaminophen (TYLENOL) suppository 650 mg, 650 mg, Rectal, Q6H PRN, Earnstine Regal, PA-C;  acetaminophen (TYLENOL) tablet 650 mg, 650 mg, Oral, Q6H PRN, Earnstine Regal, PA-C Ampicillin-Sulbactam (UNASYN) 3 g in sodium chloride 0.9 % 100 mL IVPB, 3 g, Intravenous, Q6H, Earnstine Regal, PA-C, Last Rate: 100 mL/hr at 09/07/13 0943, 3 g at 09/07/13 0943;  HYDROmorphone (DILAUDID) injection 0.5-1 mg, 0.5-1 mg, Intravenous, Q2H PRN, Earnstine Regal, PA-C;  iohexol (OMNIPAQUE) 300 MG/ML solution 50 mL, 50 mL, Intravenous, Once PRN, Medication Radiologist, MD ondansetron (ZOFRAN) injection 4 mg, 4 mg, Intravenous, Q6H PRN, Earnstine Regal, PA-C;  oxyCODONE (Oxy IR/ROXICODONE) immediate release tablet 5-10 mg, 5-10 mg, Oral, Q4H PRN, Earnstine Regal, PA-C, 5 mg at 09/07/13 1117;  pantoprazole (PROTONIX) injection 40 mg, 40 mg, Intravenous, Q12H, Earnstine Regal, PA-C No Known Allergies   Objective:     BP 118/56  Pulse 79  Temp(Src) 98.3 F (36.8 C) (Oral)  Resp 18  Ht 5\' 11"  (1.803 m)  Wt 70.761 kg (156 lb)  BMI 21.77 kg/m2  SpO2 99%  He is in no distress  Heart regular rhythm no murmurs  Lungs clear  Abdomen: Bowel sounds present, soft, mild tenderness right  upper quadrant    Laboratory No components found with this basename: d1      Assessment:     Cholecystitis with choledocholithiasis      Plan:     Continue supportive care for now. We will plan ERCP. The procedure was explained in detail along with the potential risks of bleeding, perforation, infection, and pancreatitis. He understands and is agreeable to the procedure. Lab Results  Component Value Date   HGB 14.7 09/07/2013   HGB 14.1 08/24/2013   HGB 12.9* 03/27/2011   HGB 13.0 12/01/2010   HGB 13.0 11/23/2010   HGB 11.2* 11/16/2010   HCT 41.6 09/07/2013   HCT 39.9 08/24/2013    HCT 38.9* 03/27/2011   HCT 39.6 12/01/2010   HCT 38.1* 11/23/2010   HCT 34.3* 11/16/2010   ALKPHOS 177* 09/07/2013   ALKPHOS 98 08/24/2013   ALKPHOS 89 03/08/2012   AST 130* 09/07/2013   AST 20 08/24/2013   AST 21 03/08/2012   ALT 55* 09/07/2013   ALT 11 08/24/2013   ALT 15 03/08/2012      Component Value Date/Time   WBC 2.2* 09/07/2013 0045   WBC 4.6 12/01/2010 0814   HGB 14.7 09/07/2013 0045   HGB 13.0 12/01/2010 0814   HCT 41.6 09/07/2013 0045   HCT 39.6 12/01/2010 0814   PLT 152 09/07/2013 0045   PLT 219 12/01/2010 0814   ALT 55* 09/07/2013 0045   AST 130* 09/07/2013 0045   NA 143 09/07/2013 0045   K 3.7 09/07/2013 0045   CL 102 09/07/2013 0045   CREATININE 0.90 09/07/2013 0045   CREATININE 0.87 03/08/2012 1332   BUN 12 09/07/2013 0045   CO2 28 09/07/2013 0045   CALCIUM 8.7 09/07/2013 0045   ALKPHOS 177* 09/07/2013 0045

## 2013-09-07 NOTE — H&P (Signed)
George Marshall is an 63 y.o. male.   Pulmonary:  [facility]  Dr. Elsworth Soho Rheumatology:  [facility] Chief Complaint:  Abdominal pain, nausea and vomiting. HPI: This is a 63 y/o male who has had abdominal pain below the sternum for 2-3 months, it comes and goes, not really related to food, more to position, lying down and sitting watching TV.  It would last about 10 min and then resolve.  He had a really bad episode on 08/24/13 and was seen in the ER at [facility].  He was treated for GERD and symptoms improved in the ER there, but returned after going home.  He was better by the following week.  His symptoms resolved and he was doing well till last PM he had acute onset of symptoms after dinner, pain was again mid epigastric, but went to his back.  He later developed chills and sweats, he did not take a temperature at that point.  He then developed nausea and vomiting, the pain persisted and he went back to the ER at [facility].  Work up at that facility shows slight elevation of the AST 130, and ALT of 55, and Tbil of 4.8, on 3/9 the bilirubin was 1.5. WBC is low 2.2, H/H is stable, U/A is normal.  CT scan shows changes consistent with cholelithiasis, acute cholecystitis and possible common bile duct obstruction.  Abd US shows:  Gallbladder sludge and gallstones. Largest gallstone 1 cm. Gallbladder wall is mildly thickened at 3.6 mm with irregular borders. Positive sonographic Murphy sign.  Common bile duct: Diameter: 7.1 mm which is upper normal. There is question of a small 3 mm ductal stone as identified on CT. He is fairly comfortable now and we are ask to see.   Past Medical History  Diagnosis Date  . Rheumatoid arthritis(714.0) on biweekly Humira injections 10/2010     COPD (chronic obstructive pulmonary disease)  Tobacco use for 30 years quit in 2000   Dr. Elsworth Soho     Neutropenia 11/08/2010  B12 deficiency 11/08/2010  Iron deficiency 11/08/2010     GERD (gastroesophageal reflux disease)   History of  syncope   Fracture of great toe, right, closed 09/2012   Nondisplaced.  Buddy taped, post-op shoe, ortho eval was done s/p initial dx in urgent care center.    Past Surgical History  Procedure Laterality Date  . Inguinal hernia repair      age 21    Family History  Problem Relation Age of Onset  . Hypertension      parents late 39's  . Cancer Mother     breast and cervical   Social History:  reports that he quit smoking about 13 years ago. His smoking use included Cigarettes. He has a 45 pack-year smoking history. His smokeless tobacco use includes Chew. He reports that he does not drink alcohol or use illicit drugs. Tobacco:1-2 PPD for 30 years, quit 2000 Drugs:  None ETOH:  Social in the past currently none for 5 years Divorced No children  Allergies:  He was intolerant to Methotrexate treatment for his RA  Prior to Admission medications   Medication Sig Start Date End Date Taking? Authorizing Provider  HUMIRA PEN 40 MG/0.8ML injection Inject 40 mg into the skin every 14 (fourteen) days.  06/18/12  Yes Historical Provider, MD  omeprazole (PRILOSEC) 20 MG capsule Take 1 capsule (20 mg total) by mouth daily. 08/24/13  Yes April K Palumbo-Rasch, MD  simethicone (MYLICON) 80 MG chewable tablet Chew 80  mg by mouth every 6 (six) hours as needed (gas pain).   Yes Historical Provider, MD     Results for orders placed during the hospital encounter of 09/07/13 (from the past 48 hour(s))  CBC WITH DIFFERENTIAL     Status: Abnormal   Collection Time    09/07/13 12:45 AM      Result Value Ref Range   WBC 2.2 (*) 4.0 - 10.5 K/uL   RBC 4.87  4.22 - 5.81 MIL/uL   Hemoglobin 14.7  13.0 - 17.0 g/dL   HCT 41.6  39.0 - 52.0 %   MCV 85.4  78.0 - 100.0 fL   MCH 30.2  26.0 - 34.0 pg   MCHC 35.3  30.0 - 36.0 g/dL   RDW 14.2  11.5 - 15.5 %   Platelets 152  150 - 400 K/uL   Neutrophils Relative % 58  43 - 77 %   Neutro Abs 1.3 (*) 1.7 - 7.7 K/uL   Lymphocytes Relative 24  12 - 46 %   Lymphs Abs  0.5 (*) 0.7 - 4.0 K/uL   Monocytes Relative 17 (*) 3 - 12 %   Monocytes Absolute 0.4  0.1 - 1.0 K/uL   Eosinophils Relative 1  0 - 5 %   Eosinophils Absolute 0.0  0.0 - 0.7 K/uL   Basophils Relative 1  0 - 1 %   Basophils Absolute 0.0  0.0 - 0.1 K/uL  COMPREHENSIVE METABOLIC PANEL     Status: Abnormal   Collection Time    09/07/13 12:45 AM      Result Value Ref Range   Sodium 143  137 - 147 mEq/L   Potassium 3.7  3.7 - 5.3 mEq/L   Chloride 102  96 - 112 mEq/L   CO2 28  19 - 32 mEq/L   Glucose, Bld 136 (*) 70 - 99 mg/dL   BUN 12  6 - 23 mg/dL   Creatinine, Ser 0.90  0.50 - 1.35 mg/dL   Calcium 8.7  8.4 - 10.5 mg/dL   Total Protein 7.4  6.0 - 8.3 g/dL   Albumin 3.7  3.5 - 5.2 g/dL   AST 130 (*) 0 - 37 U/L   ALT 55 (*) 0 - 53 U/L   Alkaline Phosphatase 177 (*) 39 - 117 U/L   Total Bilirubin 4.8 (*) 0.3 - 1.2 mg/dL   GFR calc non Af Amer >90  >90 mL/min   GFR calc Af Amer >90  >90 mL/min   Comment: (NOTE)     The eGFR has been calculated using the CKD EPI equation.     This calculation has not been validated in all clinical situations.     eGFR's persistently <90 mL/min signify possible Chronic Kidney     Disease.  LIPASE, BLOOD     Status: None   Collection Time    09/07/13 12:45 AM      Result Value Ref Range   Lipase 36  11 - 59 U/L  URINALYSIS, ROUTINE W REFLEX MICROSCOPIC     Status: Abnormal   Collection Time    09/07/13  4:41 AM      Result Value Ref Range   Color, Urine AMBER (*) YELLOW   Comment: BIOCHEMICALS MAY BE AFFECTED BY COLOR   APPearance CLEAR  CLEAR   Specific Gravity, Urine 1.045 (*) 1.005 - 1.030   pH 8.5 (*) 5.0 - 8.0   Glucose, UA NEGATIVE  NEGATIVE mg/dL   Hgb urine dipstick  NEGATIVE  NEGATIVE   Bilirubin Urine SMALL (*) NEGATIVE   Ketones, ur 15 (*) NEGATIVE mg/dL   Protein, ur NEGATIVE  NEGATIVE mg/dL   Urobilinogen, UA 4.0 (*) 0.0 - 1.0 mg/dL   Nitrite NEGATIVE  NEGATIVE   Leukocytes, UA NEGATIVE  NEGATIVE   Comment: MICROSCOPIC NOT DONE ON  URINES WITH NEGATIVE PROTEIN, BLOOD, LEUKOCYTES, NITRITE, OR GLUCOSE <1000 mg/dL.   US Abdomen Complete  09/07/2013   CLINICAL DATA:  Abdominal pain  EXAM: ULTRASOUND ABDOMEN COMPLETE  COMPARISON:  CT abdomen 09/07/2013  FINDINGS: Gallbladder:  Gallbladder sludge and gallstones. Largest gallstone 1 cm. Gallbladder wall is mildly thickened at 3.6 mm with irregular borders. Positive sonographic Murphy sign.  Common bile duct:  Diameter: 7.1 mm which is upper normal. There is question of a small 3 mm ductal stone as identified on CT.  Liver:  No focal lesion identified. Within normal limits in parenchymal echogenicity.  IVC:  No abnormality visualized.  Pancreas:  Limited  Spleen:  Splenomegaly.  Splenic volume is 369 cc  Right Kidney:  Length: 10.7 cm. Echogenicity within normal limits. No mass or hydronephrosis visualized.  Left Kidney:  Length: 10.3 cm. Echogenicity within normal limits. No mass or hydronephrosis visualized.  Abdominal aorta:  No aneurysm visualized.  Other findings:  None.  IMPRESSION: Gallbladder sludge in an gallstones. Gallbladder wall is mildly thickened on the patient's tender over the gallbladder. Findings are suggest consistent with acute cholecystitis.  Common bile duct is upper normal at 7.1 mm and may be obstructed by small ductal calculus, as noted on the CT.  Splenomegaly   Electronically Signed   By: Franchot Gallo M.D.   On: 09/07/2013 07:33   Ct Abdomen Pelvis W Contrast  09/07/2013   CLINICAL DATA:  Upper abdominal pain.  Vomiting.  EXAM: CT ABDOMEN AND PELVIS WITH CONTRAST  TECHNIQUE: Multidetector CT imaging of the abdomen and pelvis was performed using the standard protocol following bolus administration of intravenous contrast.  CONTRAST:  55m OMNIPAQUE IOHEXOL 300 MG/ML SOLN, 1088mOMNIPAQUE IOHEXOL 300 MG/ML SOLN  COMPARISON:  CT of the abdomen and pelvis 10/28/2010.  FINDINGS: Lung Bases: Unremarkable.  Abdomen/Pelvis: Image 33 of series 2 demonstrates a tiny focus of  high attenuation in the distal common bile duct, concerning for ductal stone. There is also a small calcified gallstone lying dependently in the gallbladder. Gallbladder wall appears mildly thickened measuring approximately 4 mm. Haziness in the pericholecystic fat suggestive of inflammation. Common bile duct does not appear dilated, there is no definite intrahepatic biliary ductal dilatation at this time. The appearance of the liver, pancreas and bilateral adrenal glands is unremarkable. The spleen is enlarged measuring up to 14.4 x 7.0 x 12.3 cm (estimated splenic volume of 620 mL). Multiple small low-attenuation lesions throughout the kidneys bilaterally are too small to definitively characterize, but are similar to the prior examination, and favored to represent tiny cysts.  Atherosclerosis throughout the abdominal and pelvic vasculature, without evidence of aneurysm. Normal appendix. No significant volume of ascites. No pneumoperitoneum. No pathologic distention of small bowel. No definite lymphadenopathy identified within the abdomen or pelvis. Urinary bladder is unremarkable in appearance.  Musculoskeletal: There are no aggressive appearing lytic or blastic lesions noted in the visualized portions of the skeleton.  IMPRESSION: 1. The appearance of the gallbladder suggests early changes of acute cholecystitis. Clinical correlation is recommended, with consideration for further evaluation with right upper quadrant abdominal ultrasound if clinically appropriate. 2. In addition, there is a tiny focus  of high attenuation within the distal common bile duct, which may represent a ductal stone. No associated intra or extrahepatic biliary ductal dilatation is noted at this time to suggest acute obstruction. 3. Mild splenomegaly. 4. Atherosclerosis. 5. Additional incidental findings, as above.   Electronically Signed   By: Vinnie Langton M.D.   On: 09/07/2013 03:43    Review of Systems  Constitutional: Positive  for chills. Negative for fever, weight loss, malaise/fatigue and diaphoresis.  HENT: Negative.        Some sinus trouble, causes some coughing.  Eyes: Negative.   Respiratory: Negative.   Cardiovascular: Negative.   Gastrointestinal: Positive for nausea and vomiting. Negative for diarrhea, constipation, blood in stool and melena. Heartburn: sounds chronic  complained of sx for 2-3 months prior to this.  Genitourinary: Negative.   Musculoskeletal: Positive for joint pain ([place] he has wrist and kneek issues.).  Skin: Negative.   Neurological: Negative.  Negative for weakness.  Endo/Heme/Allergies: Negative.   Psychiatric/Behavioral: Negative.     Blood pressure 120/61, pulse 84, temperature 98.4 F (36.9 C), temperature source Oral, resp. rate 22, height 5' 11"  (1.803 m), weight 70.761 kg (156 lb), SpO2 100.00%. Physical Exam  Constitutional: He is oriented to person, place, and time. He appears well-developed and well-nourished. No distress.  HENT:  Head: Normocephalic and atraumatic.  Nose: Nose normal.  Eyes: Conjunctivae and EOM are normal. Pupils are equal, round, and reactive to light. Right eye exhibits no discharge. Left eye exhibits no discharge. No scleral icterus.  Neck: Normal range of motion. Neck supple. No JVD present. No tracheal deviation present. No thyromegaly present.  Cardiovascular: Normal rate, regular rhythm, normal heart sounds and intact distal pulses.  Exam reveals no gallop.   No murmur heard. Respiratory: Effort normal and breath sounds normal. No respiratory distress. He has no wheezes. He has no rales. He exhibits no tenderness.  GI: Soft. Bowel sounds are normal. He exhibits no distension and no mass. There is tenderness (some in the RUQ with deep palpation, for the most part symptoms relieved with analgecic). There is no rebound and no guarding.  Musculoskeletal: He exhibits no edema and no tenderness.  Lymphadenopathy:    He has no cervical  adenopathy.  Neurological: He is alert and oriented to person, place, and time. No cranial nerve deficit.  Skin: Skin is warm and dry. No rash noted. He is not diaphoretic. No erythema. No pallor.  Psychiatric: He has a normal mood and affect. His behavior is normal. Judgment and thought content normal.     Assessment/Plan 1.  Cholecystitis, cholelithiasis, choledocholithiasis 2.  Rheumatoid Arthritis on Humira 3.  COPD/hx of tobacco use. 4.  GERD 5.  Hx of B12 deficiency, not currently on treatment.  Plan:  GI to see, and evaluate for ERCP, I am admitting and starting hydration and antibiotics.  Our hope is to get the CB stones out and follow up with Cholecystectomy this week.  Rajat Staver 09/07/2013, 8:17 AM

## 2013-09-07 NOTE — ED Notes (Signed)
Patient patient arrived to [facility]

## 2013-09-07 NOTE — ED Notes (Signed)
Bed: WA12 Expected date:  Expected time:  Means of arrival:  Comments: Hold for Pt from [place]

## 2013-09-07 NOTE — ED Notes (Signed)
Pt transferred via POV to [facility] with significant other driving. IV secured (saline lock) per order from EDP. Report to Tiffany, Therapist, sports, Charge @ [facility] ED. No adverse effects noted to IV injections.

## 2013-09-07 NOTE — H&P (Signed)
Seen and agree. Risks and benefits of surgery discussed with pt as well as some of the technical aspects and he understands and wishes to proceed

## 2013-09-07 NOTE — ED Provider Notes (Signed)
CSN: [id number]     Arrival date & time 09/06/13  2313 History   First MD Initiated Contact with Patient 09/07/13 0142     Chief Complaint  Patient presents with  . Abdominal Pain     (Consider location/radiation/quality/duration/timing/severity/associated sxs/prior Treatment) HPI Patient has been having multiple episodes of brief upper abdominal pain after meals for several months. Was seen roughly 2 weeks ago with similar symptoms that resolved in the emergency department. This evening at 9:30 he began having epigastric and right upper quadrant pain that radiated to his back. It is associated with nausea and multiple episodes of vomiting. Was seen at [facility] emergency department. CT abdomen was performed with findings suggestive of early cholecystitis and possible ductal stone. Patient also had elevated LFTs. He is transferred to the [facility] long emergency department for ultrasound of the abdomen. He states his pain is improved since earlier this evening. He has no nausea at this time. He has had chills but without fever. Past Medical History  Diagnosis Date  . Hyperlipidemia   . GERD (gastroesophageal reflux disease)   . History of syncope 12/2004  . Neutropenia 11/08/2010  . B12 deficiency 11/08/2010  . Iron deficiency 11/08/2010  . Rheumatoid arthritis(714.0) 10/2010  . COPD (chronic obstructive pulmonary disease)     Dr. Elsworth Soho  . Fracture of great toe, right, closed 09/2012    Nondisplaced.  Buddy taped, post-op shoe, ortho eval was done s/p initial dx in urgent care center.   Past Surgical History  Procedure Laterality Date  . Inguinal hernia repair      age 22   Family History  Problem Relation Age of Onset  . Hypertension      parents late 86's  . Cancer Mother     breast and cervical   History  Substance Use Topics  . Smoking status: Former Smoker -- 1.50 packs/day for 30 years    Types: Cigarettes    Quit date: 06/18/2000  . Smokeless tobacco: Current User     Types: Chew  . Alcohol Use: No    Review of Systems  Constitutional: Positive for chills. Negative for fever.  Respiratory: Negative for cough and shortness of breath.   Cardiovascular: Negative for chest pain.  Gastrointestinal: Positive for nausea, vomiting and abdominal pain. Negative for diarrhea and constipation.  Genitourinary: Negative for dysuria, frequency and flank pain.  Musculoskeletal: Positive for back pain. Negative for neck pain and neck stiffness.  Skin: Negative for rash and wound.  Neurological: Negative for dizziness, weakness, light-headedness, numbness and headaches.  All other systems reviewed and are negative.      Allergies  Review of patient's allergies indicates no known allergies.  Home Medications   Current Outpatient Rx  Name  Route  Sig  Dispense  Refill  . HUMIRA PEN 40 MG/0.8ML injection   Subcutaneous   Inject 40 mg into the skin every 14 (fourteen) days.          Marland Kitchen omeprazole (PRILOSEC) 20 MG capsule   Oral   Take 1 capsule (20 mg total) by mouth daily.   30 capsule   0    BP 120/61  Pulse 84  Temp(Src) 98.4 F (36.9 C) (Oral)  Resp 22  Ht 5\' 11"  (1.803 m)  Wt 156 lb (70.761 kg)  BMI 21.77 kg/m2  SpO2 100% Physical Exam  Nursing note and vitals reviewed. Constitutional: He is oriented to person, place, and time. He appears well-developed and well-nourished. No distress.  HENT:  Head: Normocephalic and atraumatic.  Mouth/Throat: Oropharynx is clear and moist.  Eyes: EOM are normal. Pupils are equal, round, and reactive to light.  Neck: Normal range of motion. Neck supple.  Cardiovascular: Normal rate and regular rhythm.   Pulmonary/Chest: Effort normal and breath sounds normal. No respiratory distress. He has no wheezes. He has no rales.  Abdominal: Soft. Bowel sounds are normal. He exhibits no distension and no mass. There is tenderness (mild right upper quadrant and epigastric tenderness to palpation.). There is no  rebound and no guarding.  Musculoskeletal: Normal range of motion. He exhibits no edema and no tenderness.  Neurological: He is alert and oriented to person, place, and time.  Moves all extremities without deficit. Sensation grossly intact.  Skin: Skin is warm and dry. No rash noted. No erythema.  Psychiatric: He has a normal mood and affect. His behavior is normal.    ED Course  Procedures (including critical care time) Labs Review Labs Reviewed  CBC WITH DIFFERENTIAL - Abnormal; Notable for the following:    WBC 2.2 (*)    Neutro Abs 1.3 (*)    Lymphs Abs 0.5 (*)    Monocytes Relative 17 (*)    All other components within normal limits  COMPREHENSIVE METABOLIC PANEL - Abnormal; Notable for the following:    Glucose, Bld 136 (*)    AST 130 (*)    ALT 55 (*)    Alkaline Phosphatase 177 (*)    Total Bilirubin 4.8 (*)    All other components within normal limits  URINALYSIS, ROUTINE W REFLEX MICROSCOPIC - Abnormal; Notable for the following:    Color, Urine AMBER (*)    Specific Gravity, Urine 1.045 (*)    pH 8.5 (*)    Bilirubin Urine SMALL (*)    Ketones, ur 15 (*)    Urobilinogen, UA 4.0 (*)    All other components within normal limits  LIPASE, BLOOD   Imaging Review Ct Abdomen Pelvis W Contrast  09/07/2013   CLINICAL DATA:  Upper abdominal pain.  Vomiting.  EXAM: CT ABDOMEN AND PELVIS WITH CONTRAST  TECHNIQUE: Multidetector CT imaging of the abdomen and pelvis was performed using the standard protocol following bolus administration of intravenous contrast.  CONTRAST:  57mL OMNIPAQUE IOHEXOL 300 MG/ML SOLN, 151mL OMNIPAQUE IOHEXOL 300 MG/ML SOLN  COMPARISON:  CT of the abdomen and pelvis 10/28/2010.  FINDINGS: Lung Bases: Unremarkable.  Abdomen/Pelvis: Image 33 of series 2 demonstrates a tiny focus of high attenuation in the distal common bile duct, concerning for ductal stone. There is also a small calcified gallstone lying dependently in the gallbladder. Gallbladder wall  appears mildly thickened measuring approximately 4 mm. Haziness in the pericholecystic fat suggestive of inflammation. Common bile duct does not appear dilated, there is no definite intrahepatic biliary ductal dilatation at this time. The appearance of the liver, pancreas and bilateral adrenal glands is unremarkable. The spleen is enlarged measuring up to 14.4 x 7.0 x 12.3 cm (estimated splenic volume of 620 mL). Multiple small low-attenuation lesions throughout the kidneys bilaterally are too small to definitively characterize, but are similar to the prior examination, and favored to represent tiny cysts.  Atherosclerosis throughout the abdominal and pelvic vasculature, without evidence of aneurysm. Normal appendix. No significant volume of ascites. No pneumoperitoneum. No pathologic distention of small bowel. No definite lymphadenopathy identified within the abdomen or pelvis. Urinary bladder is unremarkable in appearance.  Musculoskeletal: There are no aggressive appearing lytic or blastic lesions noted in the  visualized portions of the skeleton.  IMPRESSION: 1. The appearance of the gallbladder suggests early changes of acute cholecystitis. Clinical correlation is recommended, with consideration for further evaluation with right upper quadrant abdominal ultrasound if clinically appropriate. 2. In addition, there is a tiny focus of high attenuation within the distal common bile duct, which may represent a ductal stone. No associated intra or extrahepatic biliary ductal dilatation is noted at this time to suggest acute obstruction. 3. Mild splenomegaly. 4. Atherosclerosis. 5. Additional incidental findings, as above.   Electronically Signed   By: Vinnie Langton M.D.   On: 09/07/2013 03:43     EKG Interpretation   Date/Time:  Sunday September 06 2013 23:33:55 EDT Ventricular Rate:  87 PR Interval:  150 QRS Duration: 84 QT Interval:  364 QTC Calculation: 438 R Axis:   73 Text Interpretation:  Normal sinus  rhythm Normal ECG unchanged Confirmed  by MOLPUS  MD, Jenny Reichmann ([id number]) on 09/07/2013 1:43:26 AM      MDM   Final diagnoses:  Abdominal pain    Will obtain ultrasound and discuss with general surgery.  Discussed with Dr. Lucia Gaskins. Will see the patient in emergency department  Julianne Rice, MD 09/07/13 [phone]

## 2013-09-08 ENCOUNTER — Encounter (HOSPITAL_COMMUNITY): Admission: EM | Disposition: A | Discharge status: Home / Self Care | Payer: Self-pay | Source: Home / Self Care

## 2013-09-08 ENCOUNTER — Encounter (HOSPITAL_COMMUNITY): Payer: Self-pay | Admitting: Certified Registered Nurse Anesthetist

## 2013-09-08 ENCOUNTER — Encounter (HOSPITAL_COMMUNITY): Payer: BC Managed Care – PPO | Admitting: Certified Registered Nurse Anesthetist

## 2013-09-08 ENCOUNTER — Inpatient Hospital Stay (HOSPITAL_COMMUNITY): Payer: BC Managed Care – PPO

## 2013-09-08 ENCOUNTER — Inpatient Hospital Stay (HOSPITAL_COMMUNITY): Payer: BC Managed Care – PPO | Admitting: Certified Registered Nurse Anesthetist

## 2013-09-08 HISTORY — PX: ERCP: SHX5425

## 2013-09-08 LAB — CBC
HEMATOCRIT: 34 % — AB (ref 39.0–52.0)
HEMOGLOBIN: 11.8 g/dL — AB (ref 13.0–17.0)
MCH: 29.3 pg (ref 26.0–34.0)
MCHC: 34.7 g/dL (ref 30.0–36.0)
MCV: 84.4 fL (ref 78.0–100.0)
Platelets: 112 10*3/uL — ABNORMAL LOW (ref 150–400)
RBC: 4.03 MIL/uL — AB (ref 4.22–5.81)
RDW: 14.4 % (ref 11.5–15.5)
WBC: 2.4 10*3/uL — ABNORMAL LOW (ref 4.0–10.5)

## 2013-09-08 LAB — COMPREHENSIVE METABOLIC PANEL
ALT: 81 U/L — AB (ref 0–53)
AST: 92 U/L — ABNORMAL HIGH (ref 0–37)
Albumin: 2.7 g/dL — ABNORMAL LOW (ref 3.5–5.2)
Alkaline Phosphatase: 158 U/L — ABNORMAL HIGH (ref 39–117)
BUN: 6 mg/dL (ref 6–23)
CALCIUM: 8.1 mg/dL — AB (ref 8.4–10.5)
CO2: 24 meq/L (ref 19–32)
CREATININE: 0.87 mg/dL (ref 0.50–1.35)
Chloride: 100 mEq/L (ref 96–112)
GFR calc Af Amer: >90 mL/min (ref >90)
Glucose, Bld: 95 mg/dL (ref 70–99)
Potassium: 3.9 mEq/L (ref 3.7–5.3)
SODIUM: 136 meq/L — AB (ref 137–147)
TOTAL PROTEIN: 5.6 g/dL — AB (ref 6.0–8.3)
Total Bilirubin: 12.2 mg/dL — ABNORMAL HIGH (ref 0.3–1.2)

## 2013-09-08 LAB — SURGICAL PCR SCREEN
MRSA, PCR: NEGATIVE
Staphylococcus aureus: POSITIVE — AB

## 2013-09-08 LAB — LIPASE, BLOOD: LIPASE: 17 U/L (ref 11–59)

## 2013-09-08 SURGERY — ERCP, WITH INTERVENTION IF INDICATED
Anesthesia: General

## 2013-09-08 MED ORDER — IOHEXOL 350 MG/ML SOLN
INTRAVENOUS | Status: DC | PRN
  Administered 2013-09-08: 09:00:00

## 2013-09-08 MED ORDER — SODIUM CHLORIDE 0.9 % IV SOLN
3.0000 g | Freq: Four times a day (QID) | INTRAVENOUS | Status: DC
  Filled 2013-09-08 (×5): qty 3

## 2013-09-08 MED ORDER — LACTATED RINGERS IV SOLN
INTRAVENOUS | Status: DC
  Administered 2013-09-08: 08:00:00 via INTRAVENOUS

## 2013-09-08 MED ORDER — ONDANSETRON HCL 4 MG/2ML IJ SOLN
INTRAMUSCULAR | Status: DC | PRN
  Administered 2013-09-08: 4 mg via INTRAVENOUS

## 2013-09-08 MED ORDER — LACTATED RINGERS IV SOLN
INTRAVENOUS | Status: DC | PRN
  Administered 2013-09-08: 09:00:00 via INTRAVENOUS

## 2013-09-08 MED ORDER — GLUCAGON HCL (RDNA) 1 MG IJ SOLR
INTRAMUSCULAR | Status: DC | PRN
  Administered 2013-09-08: .5 mg via INTRAVENOUS

## 2013-09-08 MED ORDER — FENTANYL CITRATE 0.05 MG/ML IJ SOLN
INTRAMUSCULAR | Status: DC | PRN
  Administered 2013-09-08: 75 ug via INTRAVENOUS

## 2013-09-08 MED ORDER — GLUCAGON HCL (RDNA) 1 MG IJ SOLR
INTRAMUSCULAR | Status: AC
  Filled 2013-09-08: qty 1

## 2013-09-08 MED ORDER — LIDOCAINE HCL (CARDIAC) 20 MG/ML IV SOLN
INTRAVENOUS | Status: DC | PRN
  Administered 2013-09-08: 100 mg via INTRAVENOUS

## 2013-09-08 MED ORDER — PROPOFOL 10 MG/ML IV BOLUS
INTRAVENOUS | Status: DC | PRN
  Administered 2013-09-08: 150 mg via INTRAVENOUS

## 2013-09-08 MED ORDER — AMPICILLIN-SULBACTAM SODIUM 3 (2-1) G IJ SOLR
3.0000 g | INTRAMUSCULAR | Status: DC | PRN
  Administered 2013-09-08: 3 g via INTRAVENOUS

## 2013-09-08 MED ORDER — EPINEPHRINE HCL 0.1 MG/ML IJ SOSY
PREFILLED_SYRINGE | INTRAMUSCULAR | Status: AC
  Filled 2013-09-08: qty 10

## 2013-09-08 MED ORDER — CIPROFLOXACIN IN D5W 400 MG/200ML IV SOLN: 400.0000 mg | Freq: Once | INTRAVENOUS | Status: DC

## 2013-09-08 MED ORDER — SUCCINYLCHOLINE CHLORIDE 20 MG/ML IJ SOLN
INTRAMUSCULAR | Status: DC | PRN
  Administered 2013-09-08: 75 mg via INTRAVENOUS

## 2013-09-08 MED ORDER — SODIUM CHLORIDE 0.9 % IJ SOLN
PREFILLED_SYRINGE | INTRAMUSCULAR | Status: DC | PRN
  Administered 2013-09-08: 09:00:00

## 2013-09-08 MED ORDER — GLUCAGON HCL (RDNA) 1 MG IJ SOLR
INTRAMUSCULAR | Status: AC
Start: 2013-09-08 — End: 2013-09-08
  Filled 2013-09-08: qty 1

## 2013-09-08 NOTE — Progress Notes (Signed)
Day of Surgery  Subjective: He is back from the ERCP now, but he is clearly jaundice today.  No pain or complaints now.    Objective: Vital signs in last 24 hours: Temp:  [97.5 F (36.4 C)-99.9 F (37.7 C)] 97.5 F (36.4 C) (03/24 1039) Pulse Rate:  [74-90] 79 (03/24 1039) Resp:  [12-19] 13 (03/24 1010) BP: (90-131)/(50-76) 108/68 mmHg (03/24 1039) SpO2:  [94 %-100 %] 97 % (03/24 1039) Last BM Date: 09/06/13 240 Po last PM, NPO for ERCP after MN, back on clears after ERCP. Afebrile, VSS one low BP with ERCP Bilirubin up to 12.2 this Am, WBC is still low.  H/H is also down.  Intake/Output from previous day: 03/23 0701 - 03/24 0700 In: 2643.3 [P.O.:240; I.V.:2003.3; IV Piggyback:400] Out: 2250 [Urine:2250] Intake/Output this shift: Total I/O In: 822 [P.O.:222; I.V.:600] Out: -   General appearance: alert, cooperative, icteric and no distress GI: soft, non-tender; bowel sounds normal; no masses,  no organomegaly  Lab Results:   Recent Labs  09/07/13 0045 09/08/13 0437  WBC 2.2* 2.4*  HGB 14.7 11.8*  HCT 41.6 34.0*  PLT 152 112*    BMET  Recent Labs  09/07/13 0045 09/08/13 0437  NA 143 136*  K 3.7 3.9  CL 102 100  CO2 28 24  GLUCOSE 136* 95  BUN 12 6  CREATININE 0.90 0.87  CALCIUM 8.7 8.1*   PT/INR  Recent Labs  09/07/13 0855  LABPROT 14.0  INR 1.10     Recent Labs Lab 09/07/13 0045 09/08/13 0437  AST 130* 92*  ALT 55* 81*  ALKPHOS 177* 158*  BILITOT 4.8* 12.2*  PROT 7.4 5.6*  ALBUMIN 3.7 2.7*     Lipase     Component Value Date/Time   LIPASE 17 09/08/2013 0437     Studies/Results: US Abdomen Complete  09/07/2013   CLINICAL DATA:  Abdominal pain  EXAM: ULTRASOUND ABDOMEN COMPLETE  COMPARISON:  CT abdomen 09/07/2013  FINDINGS: Gallbladder:  Gallbladder sludge and gallstones. Largest gallstone 1 cm. Gallbladder wall is mildly thickened at 3.6 mm with irregular borders. Positive sonographic Murphy sign.  Common bile duct:  Diameter: 7.1  mm which is upper normal. There is question of a small 3 mm ductal stone as identified on CT.  Liver:  No focal lesion identified. Within normal limits in parenchymal echogenicity.  IVC:  No abnormality visualized.  Pancreas:  Limited  Spleen:  Splenomegaly.  Splenic volume is 369 cc  Right Kidney:  Length: 10.7 cm. Echogenicity within normal limits. No mass or hydronephrosis visualized.  Left Kidney:  Length: 10.3 cm. Echogenicity within normal limits. No mass or hydronephrosis visualized.  Abdominal aorta:  No aneurysm visualized.  Other findings:  None.  IMPRESSION: Gallbladder sludge in an gallstones. Gallbladder wall is mildly thickened on the patient's tender over the gallbladder. Findings are suggest consistent with acute cholecystitis.  Common bile duct is upper normal at 7.1 mm and may be obstructed by small ductal calculus, as noted on the CT.  Splenomegaly   Electronically Signed   By: Franchot Gallo M.D.   On: 09/07/2013 07:33   Ct Abdomen Pelvis W Contrast  09/07/2013   CLINICAL DATA:  Upper abdominal pain.  Vomiting.  EXAM: CT ABDOMEN AND PELVIS WITH CONTRAST  TECHNIQUE: Multidetector CT imaging of the abdomen and pelvis was performed using the standard protocol following bolus administration of intravenous contrast.  CONTRAST:  12mL OMNIPAQUE IOHEXOL 300 MG/ML SOLN, 187mL OMNIPAQUE IOHEXOL 300 MG/ML SOLN  COMPARISON:  CT of the abdomen and pelvis 10/28/2010.  FINDINGS: Lung Bases: Unremarkable.  Abdomen/Pelvis: Image 33 of series 2 demonstrates a tiny focus of high attenuation in the distal common bile duct, concerning for ductal stone. There is also a small calcified gallstone lying dependently in the gallbladder. Gallbladder wall appears mildly thickened measuring approximately 4 mm. Haziness in the pericholecystic fat suggestive of inflammation. Common bile duct does not appear dilated, there is no definite intrahepatic biliary ductal dilatation at this time. The appearance of the liver,  pancreas and bilateral adrenal glands is unremarkable. The spleen is enlarged measuring up to 14.4 x 7.0 x 12.3 cm (estimated splenic volume of 620 mL). Multiple small low-attenuation lesions throughout the kidneys bilaterally are too small to definitively characterize, but are similar to the prior examination, and favored to represent tiny cysts.  Atherosclerosis throughout the abdominal and pelvic vasculature, without evidence of aneurysm. Normal appendix. No significant volume of ascites. No pneumoperitoneum. No pathologic distention of small bowel. No definite lymphadenopathy identified within the abdomen or pelvis. Urinary bladder is unremarkable in appearance.  Musculoskeletal: There are no aggressive appearing lytic or blastic lesions noted in the visualized portions of the skeleton.  IMPRESSION: 1. The appearance of the gallbladder suggests early changes of acute cholecystitis. Clinical correlation is recommended, with consideration for further evaluation with right upper quadrant abdominal ultrasound if clinically appropriate. 2. In addition, there is a tiny focus of high attenuation within the distal common bile duct, which may represent a ductal stone. No associated intra or extrahepatic biliary ductal dilatation is noted at this time to suggest acute obstruction. 3. Mild splenomegaly. 4. Atherosclerosis. 5. Additional incidental findings, as above.   Electronically Signed   By: Vinnie Langton M.D.   On: 09/07/2013 03:43    Medications: . ampicillin-sulbactam (UNASYN) IV  3 g Intravenous Q6H  . ampicillin-sulbactam (UNASYN) IV  3 g Intravenous Q6H  . pantoprazole (PROTONIX) IV  40 mg Intravenous Q12H    Assessment/Plan 1. Cholecystitis, cholelithiasis, choledocholithiasis   ERCP with sphincterotomy/papillotomy, ERCP with  bile duct stone extraction.  09/08/13, DR. Arta Silence 2. Rheumatoid Arthritis on Humira  3. COPD/hx of tobacco use.  4. GERD  5. Hx of B12 deficiency, not currently  on treatment.   Plan:  Continue antibiotics,  Clear liquids, and if OK we will do cholecystectomy tomorrow.  Dr. Erlinda Hong note says there was some bleeding with sphincterotomy , so i will not start heparin, he does have SCD's ordered for DVT prophylaxis.  LOS: 1 day    Torell Minder 09/08/2013

## 2013-09-08 NOTE — Anesthesia Preprocedure Evaluation (Signed)
Anesthesia Evaluation  Patient identified by MRN, date of birth, ID band Patient awake    Reviewed: Allergy & Precautions, H&P , NPO status , Patient's Chart, lab work & pertinent test results  Airway       Dental   Pulmonary COPDformer smoker,          Cardiovascular     Neuro/Psych    GI/Hepatic GERD-  ,  Endo/Other    Renal/GU      Musculoskeletal  (+) Arthritis -,   Abdominal   Peds  Hematology   Anesthesia Other Findings   Reproductive/Obstetrics                           Anesthesia Physical Anesthesia Plan  ASA: II  Anesthesia Plan: General   Post-op Pain Management:    Induction: Intravenous  Airway Management Planned: Oral ETT  Additional Equipment:   Intra-op Plan:   Post-operative Plan: Extubation in OR  Informed Consent: I have reviewed the patients History and Physical, chart, labs and discussed the procedure including the risks, benefits and alternatives for the proposed anesthesia with the patient or authorized representative who has indicated his/her understanding and acceptance.     Plan Discussed with:   Anesthesia Plan Comments:         Anesthesia Quick Evaluation

## 2013-09-08 NOTE — Progress Notes (Signed)
Patient experienced skin tear on left hand, ring finger.  Cleansed with normal saline, pressure applied and tegaderm applied to area. This occurred some time after procedure was completed and patient was transferred from xray bed to stretcher. Drs. Outlaw and Smith aware.

## 2013-09-08 NOTE — Op Note (Signed)
[facility] [place], [id number]   ERCP PROCEDURE REPORT  PATIENT: George Marshall, George Marshall.  MR# :[id number] BIRTHDATE: [birth year]  GENDER: Male ENDOSCOPIST: Arta Silence, MD REFERRED BY: Autumn Messing, M.D. PROCEDURE DATE:  09/08/2013 PROCEDURE:   ERCP with sphincterotomy/papillotomy, ERCP with bile duct stone extraction ASA CLASS:    ASA-II INDICATIONS: Obstructive jaundice, gallstones, CBD stone seen on US/CT MEDICATIONS:    General endotracheal anesthesia, glucagon 0.5 mg IV, Unasyn 3 g IV  DESCRIPTION OF PROCEDURE:   After the risks benefits and alternatives of the procedure were thoroughly explained, informed consent was obtained.  The Pentax Ercp Scope [id number]  endoscope was introduced through the mouth and advanced to the second portion of the duodenum .    FINDINGS:  Ampulla reached and was normal-appearing; minimal bile flow prior to instrumentation.  Deep biliary access obtained. Intra- and extrahepatic bile ducts were non-dilated.  There was suggestion of distal bile duct filling defect.  Moderate biliary sphincterotomy was performed with blended current; there was some mild post-sphincterotomy bleeding that resolved with application of topical diluted epinephrine and the tincture of time.  There was some purulent bile that began to flow from the ampulla after papillotomy.  Trapezoidal extraction basket was used to remove one 66mm green-gray round stone.  Multiple balloon and basket sweeps showed no further stones.  Post-occlusion cholangiogram showed no obvious residual filling defects.  Bile flow per ampulla post-procedure was good.  Pancreatogram was not obtained, intentionally.  ENDOSCOPIC IMPRESSION:  Choledocholithiasis, removed after biliary sphincterotomy.  Cholangitis with purulent bile.  RECOMMENDATIONS: 1.  Watch for potential complications of procedure. 2.  No ASA/NSAIDs for 5 days post-sphincterotomy. 3.  Consider  trial of clear liquids once patient wakes up. 4.  Cholecystectomy per surgical team, if there are no obvious post-procedural complications. 5.  [facility] will follow; thank you for the consult.     _______________________________ eSigned:  Arta Silence, MD 09/08/2013 9:37 AM   CC:

## 2013-09-08 NOTE — Anesthesia Postprocedure Evaluation (Signed)
  Anesthesia Post-op Note  Patient: George Marshall  Procedure(s) Performed: Procedure(s): ENDOSCOPIC RETROGRADE CHOLANGIOPANCREATOGRAPHY (ERCP) (N/A)  Patient Location: PACU and Endoscopy Unit  Anesthesia Type:General  Level of Consciousness: awake, oriented, sedated and patient cooperative  Airway and Oxygen Therapy: Patient Spontanous Breathing  Post-op Pain: none  Post-op Assessment: Post-op Vital signs reviewed, Patient's Cardiovascular Status Stable, Respiratory Function Stable, Patent Airway, No signs of Nausea or vomiting and Pain level controlled  Post-op Vital Signs: stable  Complications: No apparent anesthesia complications

## 2013-09-08 NOTE — Progress Notes (Signed)
Pt. Positioned in prone position with jelly rolls under axillary areas bilaterally.  Foot roll under ankles to support feet.  No restrictions or pressure areas noted.  Head supported with foam pillow.

## 2013-09-08 NOTE — Preoperative (Signed)
Beta Blockers   Reason not to administer Beta Blockers:Not Applicable 

## 2013-09-08 NOTE — H&P (View-Only) (Signed)
Subjective:   HPI  The patient is a 63 year old male who was admitted to the hospital this morning with complaints of right upper quadrant abdominal pain. He had an abdominal ultrasound as well as CT scan of the abdomen. He was admitted with the diagnosis of acute cholecystitis. Gallstones were seen. There was suggestion on CT scan of a common bile duct stone. Liver enzymes are elevated with total bilirubin 4.8 alkaline phosphatase 177 ALT 55 AST 130. We are asked to see him in regards to ERCP. He is feeling much better at this time. He had some vomiting last night but none today. He was given her recent pain medication and his pain is much improved.  Review of Systems No chest pain or shortness of breath  Past Medical History  Diagnosis Date  . Hyperlipidemia   . GERD (gastroesophageal reflux disease)   . History of syncope 12/2004  . Neutropenia 11/08/2010  . B12 deficiency 11/08/2010  . Iron deficiency 11/08/2010  . Rheumatoid arthritis(714.0) 10/2010  . COPD (chronic obstructive pulmonary disease)     Dr. Elsworth Soho  . Fracture of great toe, right, closed 09/2012    Nondisplaced.  Buddy taped, post-op shoe, ortho eval was done s/p initial dx in urgent care center.   Past Surgical History  Procedure Laterality Date  . Inguinal hernia repair      age 29   History   Social History  . Marital Status: Divorced    Spouse Name: N/A    Number of Children: N/A  . Years of Education: N/A   Occupational History  . Not on file.   Social History Main Topics  . Smoking status: Former Smoker -- 1.50 packs/day for 30 years    Types: Cigarettes    Quit date: 06/18/2000  . Smokeless tobacco: Current User    Types: Chew  . Alcohol Use: No  . Drug Use: No  . Sexual Activity: Not on file   Other Topics Concern  . Not on file   Social History Narrative  . No narrative on file   family history includes Cancer in his mother; Hypertension in an other family member. Current facility-administered  medications:0.9 % NaCl with KCl 20 mEq/ L  infusion, , Intravenous, Continuous, Earnstine Regal, PA-C, Last Rate: 100 mL/hr at 09/07/13 8338;  acetaminophen (TYLENOL) suppository 650 mg, 650 mg, Rectal, Q6H PRN, Earnstine Regal, PA-C;  acetaminophen (TYLENOL) tablet 650 mg, 650 mg, Oral, Q6H PRN, Earnstine Regal, PA-C Ampicillin-Sulbactam (UNASYN) 3 g in sodium chloride 0.9 % 100 mL IVPB, 3 g, Intravenous, Q6H, Earnstine Regal, PA-C, Last Rate: 100 mL/hr at 09/07/13 0943, 3 g at 09/07/13 0943;  HYDROmorphone (DILAUDID) injection 0.5-1 mg, 0.5-1 mg, Intravenous, Q2H PRN, Earnstine Regal, PA-C;  iohexol (OMNIPAQUE) 300 MG/ML solution 50 mL, 50 mL, Intravenous, Once PRN, Medication Radiologist, MD ondansetron (ZOFRAN) injection 4 mg, 4 mg, Intravenous, Q6H PRN, Earnstine Regal, PA-C;  oxyCODONE (Oxy IR/ROXICODONE) immediate release tablet 5-10 mg, 5-10 mg, Oral, Q4H PRN, Earnstine Regal, PA-C, 5 mg at 09/07/13 1117;  pantoprazole (PROTONIX) injection 40 mg, 40 mg, Intravenous, Q12H, Earnstine Regal, PA-C No Known Allergies   Objective:     BP 118/56  Pulse 79  Temp(Src) 98.3 F (36.8 C) (Oral)  Resp 18  Ht 5\' 11"  (1.803 m)  Wt 70.761 kg (156 lb)  BMI 21.77 kg/m2  SpO2 99%  He is in no distress  Heart regular rhythm no murmurs  Lungs clear  Abdomen: Bowel sounds present, soft, mild tenderness right  upper quadrant    Laboratory No components found with this basename: d1      Assessment:     Cholecystitis with choledocholithiasis      Plan:     Continue supportive care for now. We will plan ERCP. The procedure was explained in detail along with the potential risks of bleeding, perforation, infection, and pancreatitis. He understands and is agreeable to the procedure. Lab Results  Component Value Date   HGB 14.7 09/07/2013   HGB 14.1 08/24/2013   HGB 12.9* 03/27/2011   HGB 13.0 12/01/2010   HGB 13.0 11/23/2010   HGB 11.2* 11/16/2010   HCT 41.6 09/07/2013   HCT 39.9 08/24/2013    HCT 38.9* 03/27/2011   HCT 39.6 12/01/2010   HCT 38.1* 11/23/2010   HCT 34.3* 11/16/2010   ALKPHOS 177* 09/07/2013   ALKPHOS 98 08/24/2013   ALKPHOS 89 03/08/2012   AST 130* 09/07/2013   AST 20 08/24/2013   AST 21 03/08/2012   ALT 55* 09/07/2013   ALT 11 08/24/2013   ALT 15 03/08/2012      Component Value Date/Time   WBC 2.2* 09/07/2013 0045   WBC 4.6 12/01/2010 0814   HGB 14.7 09/07/2013 0045   HGB 13.0 12/01/2010 0814   HCT 41.6 09/07/2013 0045   HCT 39.6 12/01/2010 0814   PLT 152 09/07/2013 0045   PLT 219 12/01/2010 0814   ALT 55* 09/07/2013 0045   AST 130* 09/07/2013 0045   NA 143 09/07/2013 0045   K 3.7 09/07/2013 0045   CL 102 09/07/2013 0045   CREATININE 0.90 09/07/2013 0045   CREATININE 0.87 03/08/2012 1332   BUN 12 09/07/2013 0045   CO2 28 09/07/2013 0045   CALCIUM 8.7 09/07/2013 0045   ALKPHOS 177* 09/07/2013 0045

## 2013-09-08 NOTE — Interval H&P Note (Signed)
History and Physical Interval Note:  09/08/2013 8:40 AM  George Marshall  has presented today for surgery, with the diagnosis of bile duct stone, elevated LFT's  The various methods of treatment have been discussed with the patient and family. After consideration of risks, benefits and other options for treatment, the patient has consented to  Procedure(s): ENDOSCOPIC RETROGRADE CHOLANGIOPANCREATOGRAPHY (ERCP) (N/A) as a surgical intervention .  The patient's history has been reviewed, patient examined, no change in status, stable for surgery.  I have reviewed the patient's chart and labs.  Questions were answered to the patient's satisfaction.     George Marshall M  Assessment:  1.  Bile duct stones. 2.  Gallstones.  Plan:  1.  Endoscopic retrograde cholangiopancreatography with possible biliary sphincterotomy and bile duct stone extraction. 2.  Risks (up to and including bleeding, infection, perforation, pancreatitis that can be complicated by infected necrosis and death), benefits (removal of stones, alleviating blockage, decreasing risk of cholangitis or choledocholithiasis-related pancreatitis), and alternatives (watchful waiting, percutaneous transhepatic cholangiography) of ERCP were explained to patient/family in detail and patient elects to proceed.

## 2013-09-08 NOTE — Transfer of Care (Signed)
Immediate Anesthesia Transfer of Care Note  Patient: George Marshall  Procedure(s) Performed: Procedure(s): ENDOSCOPIC RETROGRADE CHOLANGIOPANCREATOGRAPHY (ERCP) (N/A)  Patient Location: PACU  Anesthesia Type:General  Level of Consciousness: awake, alert  and oriented  Airway & Oxygen Therapy: Patient Spontanous Breathing  Post-op Assessment: Report given to PACU RN  Post vital signs: Reviewed and stable  Complications: No apparent anesthesia complications

## 2013-09-09 ENCOUNTER — Encounter (HOSPITAL_COMMUNITY): Payer: Self-pay | Admitting: Gastroenterology

## 2013-09-09 LAB — CBC
HCT: 33.6 % — ABNORMAL LOW (ref 39.0–52.0)
Hemoglobin: 11.3 g/dL — ABNORMAL LOW (ref 13.0–17.0)
MCH: 28.9 pg (ref 26.0–34.0)
MCHC: 33.6 g/dL (ref 30.0–36.0)
MCV: 85.9 fL (ref 78.0–100.0)
Platelets: 107 K/uL — ABNORMAL LOW (ref 150–400)
RBC: 3.91 MIL/uL — ABNORMAL LOW (ref 4.22–5.81)
RDW: 14.4 % (ref 11.5–15.5)
WBC: 2.4 K/uL — ABNORMAL LOW (ref 4.0–10.5)

## 2013-09-09 LAB — COMPREHENSIVE METABOLIC PANEL
ALT: 64 U/L — AB (ref 0–53)
AST: 55 U/L — ABNORMAL HIGH (ref 0–37)
Albumin: 2.6 g/dL — ABNORMAL LOW (ref 3.5–5.2)
Alkaline Phosphatase: 164 U/L — ABNORMAL HIGH (ref 39–117)
BUN: 7 mg/dL (ref 6–23)
CO2: 23 meq/L (ref 19–32)
CREATININE: 0.84 mg/dL (ref 0.50–1.35)
Calcium: 8.2 mg/dL — ABNORMAL LOW (ref 8.4–10.5)
Chloride: 104 mEq/L (ref 96–112)
GLUCOSE: 91 mg/dL (ref 70–99)
Potassium: 3.8 mEq/L (ref 3.7–5.3)
SODIUM: 140 meq/L (ref 137–147)
Total Bilirubin: 9.5 mg/dL — ABNORMAL HIGH (ref 0.3–1.2)
Total Protein: 5.7 g/dL — ABNORMAL LOW (ref 6.0–8.3)

## 2013-09-09 LAB — LIPASE, BLOOD: Lipase: 43 U/L (ref 11–59)

## 2013-09-09 MED ORDER — CHLORHEXIDINE GLUCONATE CLOTH 2 % EX PADS
6.0000 | MEDICATED_PAD | Freq: Every day | CUTANEOUS | Status: AC
  Administered 2013-09-09 – 2013-09-13 (×5): 6 via TOPICAL

## 2013-09-09 MED ORDER — MIDAZOLAM HCL 2 MG/2ML IJ SOLN
INTRAMUSCULAR | Status: AC
  Filled 2013-09-09: qty 2

## 2013-09-09 MED ORDER — FENTANYL CITRATE 0.05 MG/ML IJ SOLN
INTRAMUSCULAR | Status: AC
  Filled 2013-09-09: qty 5

## 2013-09-09 MED ORDER — PROPOFOL 10 MG/ML IV BOLUS
INTRAVENOUS | Status: AC
  Filled 2013-09-09: qty 20

## 2013-09-09 MED ORDER — MUPIROCIN 2 % EX OINT
1.0000 "application " | TOPICAL_OINTMENT | Freq: Two times a day (BID) | CUTANEOUS | Status: AC
  Administered 2013-09-09 – 2013-09-13 (×9): 1 via NASAL
  Filled 2013-09-09 (×2): qty 22

## 2013-09-09 NOTE — Progress Notes (Signed)
1 Day Post-Op  Subjective: Pt's pain is improved, less yellow per girlfriend.  Ambulating well.  No IS in room.  Tolerating clears.  Understanding about surgery being moved.    Objective: Vital signs in last 24 hours: Temp:  [98.3 F (36.8 C)-99 F (37.2 C)] 98.3 F (36.8 C) (03/25 0605) Pulse Rate:  [72-78] 77 (03/25 0605) Resp:  [16] 16 (03/25 0605) BP: (109-110)/(68-73) 110/73 mmHg (03/25 0605) SpO2:  [96 %] 96 % (03/25 0605) Last BM Date: 09/06/13  Intake/Output from previous day: 03/24 0701 - 03/25 0700 In: 4027 [P.O.:1302; I.V.:2725] Out: 2725 [Urine:2725] Intake/Output this shift: Total I/O In: 400 [I.V.:400] Out: 300 [Urine:300]  PE: Gen:  Alert, NAD, pleasant Abd: Soft, mild epigastric tenderness, ND, +BS, no HSM  Lab Results:   Recent Labs  09/08/13 0437 09/09/13 0435  WBC 2.4* 2.4*  HGB 11.8* 11.3*  HCT 34.0* 33.6*  PLT 112* 107*   BMET  Recent Labs  09/08/13 0437 09/09/13 0435  NA 136* 140  K 3.9 3.8  CL 100 104  CO2 24 23  GLUCOSE 95 91  BUN 6 7  CREATININE 0.87 0.84  CALCIUM 8.1* 8.2*   PT/INR  Recent Labs  09/07/13 0855  LABPROT 14.0  INR 1.10   CMP     Component Value Date/Time   NA 140 09/09/2013 0435   K 3.8 09/09/2013 0435   CL 104 09/09/2013 0435   CO2 23 09/09/2013 0435   GLUCOSE 91 09/09/2013 0435   GLUCOSE 90 05/17/2006 0822   BUN 7 09/09/2013 0435   CREATININE 0.84 09/09/2013 0435   CREATININE 0.87 03/08/2012 1332   CALCIUM 8.2* 09/09/2013 0435   PROT 5.7* 09/09/2013 0435   ALBUMIN 2.6* 09/09/2013 0435   AST 55* 09/09/2013 0435   ALT 64* 09/09/2013 0435   ALKPHOS 164* 09/09/2013 0435   BILITOT 9.5* 09/09/2013 0435   GFRNONAA >90 09/09/2013 0435   GFRAA >90 09/09/2013 0435   Lipase     Component Value Date/Time   LIPASE 43 09/09/2013 0435       Studies/Results: Dg Ercp With Sphincterotomy  09/08/2013   CLINICAL DATA:  ERCP with sphincterotomy and stone removal  EXAM: ERCP  TECHNIQUE: Multiple spot images obtained  with the fluoroscopic device and submitted for interpretation post-procedure.  COMPARISON:  US ABDOMEN COMPLETE dated 09/07/2013; CT ABD/PELVIS W CM dated 09/07/2013  FINDINGS: Three spot intraoperative fluoroscopic images of the right upper abdominal quadrant during ERCP are provided for review.  Initial image demonstrates an ERCP probe overlying the right upper abdominal quadrant. There is selective cannulation and opacification of the common bile duct which appears mildly dilated. No discrete filling defects are identified within the opacified portion of the common bile duct though note, the distal aspect of the common bile duct the softly evaluated due to suboptimal distention.  Subsequent images demonstrate instillation of a sphincterotomy balloon within the central aspect of the common bile / proper hepatic duct.  There is no definitive opacification of the pancreatic or cystic ducts. There is mild dilatation of the central aspect of the intrahepatic biliary tree which appears nondilated.  IMPRESSION: ERCP with biliary sphincterotomy as above.  These images were submitted for radiologic interpretation only. Please see the procedural report for the amount of contrast and the fluoroscopy time utilized.   Electronically Signed   By: Sandi Mariscal M.D.   On: 09/08/2013 13:28    Anti-infectives: Anti-infectives   Start     Dose/Rate Route Frequency Ordered Stop  09/08/13 0845  Ampicillin-Sulbactam (UNASYN) 3 g in sodium chloride 0.9 % 100 mL IVPB  Status:  Discontinued     3 g 100 mL/hr over 60 Minutes Intravenous Every 6 hours 09/08/13 0843 09/08/13 1947   09/08/13 0815  ciprofloxacin (CIPRO) IVPB 400 mg  Status:  Discontinued     400 mg 200 mL/hr over 60 Minutes Intravenous  Once 09/08/13 0806 09/08/13 0806   09/07/13 0900  Ampicillin-Sulbactam (UNASYN) 3 g in sodium chloride 0.9 % 100 mL IVPB     3 g 100 mL/hr over 60 Minutes Intravenous Every 6 hours 09/07/13 0815         Assessment/Plan 1.  Cholecystitis, cholelithiasis, choledocholithiasis   ERCP with sphincterotomy/papillotomy, ERCP with bile duct stone extraction. 09/08/13, DR. Arta Silence  2. Rheumatoid Arthritis on Humira  3. COPD/hx of tobacco use.  4. GERD  5. Hx of B12 deficiency, not currently on treatment.   Plan: Continue antibiotics, Clear liquids today NPO after MN.  Plan for OR tomorrow given patients continued elevated LFT's and bleeding yesterday. Dr. Erlinda Hong note says there was some bleeding with sphincterotomy, so will hold heparin, he does have SCD's ordered for DVT prophylaxis.  Recheck labs in am.  Practice IS.  Gave girlfriend note regarding his surgery being postponed.    LOS: 2 days    Coralie Keens 09/09/2013, 12:01 PM Pager: [phone]

## 2013-09-09 NOTE — Anesthesia Preprocedure Evaluation (Signed)
Anesthesia Evaluation  Patient identified by MRN, date of birth, ID band Patient awake    Reviewed: Allergy & Precautions, H&P , NPO status , Patient's Chart, lab work & pertinent test results  Airway Mallampati: II TM Distance: >3 FB Neck ROM: Full    Dental   Pulmonary pneumonia -, COPDformer smoker,  breath sounds clear to auscultation        Cardiovascular negative cardio ROS  Rhythm:Regular Rate:Normal     Neuro/Psych negative neurological ROS  negative psych ROS   GI/Hepatic Neg liver ROS, GERD-  Medicated,  Endo/Other  negative endocrine ROS  Renal/GU negative Renal ROS     Musculoskeletal  (+) Arthritis -, Rheumatoid disorders,    Abdominal   Peds  Hematology negative hematology ROS (+) anemia ,   Anesthesia Other Findings   Reproductive/Obstetrics                           Anesthesia Physical  Anesthesia Plan  ASA: II  Anesthesia Plan: General   Post-op Pain Management:    Induction: Intravenous  Airway Management Planned: Oral ETT  Additional Equipment:   Intra-op Plan:   Post-operative Plan: Extubation in OR  Informed Consent: I have reviewed the patients History and Physical, chart, labs and discussed the procedure including the risks, benefits and alternatives for the proposed anesthesia with the patient or authorized representative who has indicated his/her understanding and acceptance.   Dental advisory given  Plan Discussed with: CRNA  Anesthesia Plan Comments:         Anesthesia Quick Evaluation

## 2013-09-09 NOTE — Progress Notes (Signed)
Subjective: Some epigastric discomfort early this morning, resolved.  Objective: Vital signs in last 24 hours: Temp:  [97.5 F (36.4 C)-99 F (37.2 C)] 98.3 F (36.8 C) (03/25 0605) Pulse Rate:  [72-87] 77 (03/25 0605) Resp:  [12-18] 16 (03/25 0605) BP: (90-113)/(50-73) 110/73 mmHg (03/25 0605) SpO2:  [95 %-100 %] 96 % (03/25 0605) Weight change:  Last BM Date: 09/07/13  PE: GEN:  NAD, jaundiced SKIN:  Extensive bruising, some skin tears (chronic problem > 10 years per patient) ABD:  Soft, non-tender  Lab Results: Lipase     Component Value Date/Time   LIPASE 43 09/09/2013 0435   CBC    Component Value Date/Time   WBC 2.4* 09/09/2013 0435   WBC 4.6 12/01/2010 0814   RBC 3.91* 09/09/2013 0435   RBC 4.87 12/01/2010 0814   RBC 3.97* 10/28/2010 1517   HGB 11.3* 09/09/2013 0435   HGB 13.0 12/01/2010 0814   HCT 33.6* 09/09/2013 0435   HCT 39.6 12/01/2010 0814   PLT 107* 09/09/2013 0435   PLT 219 12/01/2010 0814   MCV 85.9 09/09/2013 0435   MCV 81.3 12/01/2010 0814   MCH 28.9 09/09/2013 0435   MCH 26.7* 12/01/2010 0814   MCHC 33.6 09/09/2013 0435   MCHC 32.8 12/01/2010 0814   RDW 14.4 09/09/2013 0435   RDW 16.0* 12/01/2010 0814   LYMPHSABS 0.5* 09/07/2013 0045   LYMPHSABS 0.4* 12/01/2010 0814   MONOABS 0.4 09/07/2013 0045   MONOABS 0.2 12/01/2010 0814   EOSABS 0.0 09/07/2013 0045   EOSABS 0.0 12/01/2010 0814   BASOSABS 0.0 09/07/2013 0045   BASOSABS 0.0 12/01/2010 0814   CMP     Component Value Date/Time   NA 140 09/09/2013 0435   K 3.8 09/09/2013 0435   CL 104 09/09/2013 0435   CO2 23 09/09/2013 0435   GLUCOSE 91 09/09/2013 0435   GLUCOSE 90 05/17/2006 0822   BUN 7 09/09/2013 0435   CREATININE 0.84 09/09/2013 0435   CREATININE 0.87 03/08/2012 1332   CALCIUM 8.2* 09/09/2013 0435   PROT 5.7* 09/09/2013 0435   ALBUMIN 2.6* 09/09/2013 0435   AST 55* 09/09/2013 0435   ALT 64* 09/09/2013 0435   ALKPHOS 164* 09/09/2013 0435   BILITOT 9.5* 09/09/2013 0435   GFRNONAA >90 09/09/2013 0435   GFRAA  >90 09/09/2013 0435   Assessment:  1.  Choledocholithiasis with cholangitis, post ERCP with biliary sphincterotomy and bile duct stone extraction. 2.  Cholelithiasis with cholecystitis. 3.  Elevated LFTs, likely from #1 above, improving.  Plan:  1.  Continue supportive care, including antibiotics. 2.  Cholecystectomy planned for this afternoon, per patient. 3.  [facility] will follow.   Landry Dyke 09/09/2013, 8:52 AM

## 2013-09-10 ENCOUNTER — Encounter (HOSPITAL_COMMUNITY): Admission: EM | Disposition: A | Discharge status: Home / Self Care | Payer: Self-pay | Source: Home / Self Care

## 2013-09-10 ENCOUNTER — Inpatient Hospital Stay (HOSPITAL_COMMUNITY): Payer: BC Managed Care – PPO | Admitting: Anesthesiology

## 2013-09-10 ENCOUNTER — Encounter (HOSPITAL_COMMUNITY): Payer: Self-pay | Admitting: Certified Registered Nurse Anesthetist

## 2013-09-10 ENCOUNTER — Inpatient Hospital Stay (HOSPITAL_COMMUNITY): Payer: BC Managed Care – PPO

## 2013-09-10 ENCOUNTER — Encounter (HOSPITAL_COMMUNITY): Payer: BC Managed Care – PPO | Admitting: Anesthesiology

## 2013-09-10 DIAGNOSIS — K8000 Calculus of gallbladder with acute cholecystitis without obstruction: Secondary | ICD-10-CM

## 2013-09-10 HISTORY — PX: CHOLECYSTECTOMY: SHX55

## 2013-09-10 LAB — COMPREHENSIVE METABOLIC PANEL
ALT: 48 U/L (ref 0–53)
AST: 30 U/L (ref 0–37)
Albumin: 2.5 g/dL — ABNORMAL LOW (ref 3.5–5.2)
Alkaline Phosphatase: 165 U/L — ABNORMAL HIGH (ref 39–117)
BUN: 6 mg/dL (ref 6–23)
CALCIUM: 8 mg/dL — AB (ref 8.4–10.5)
CHLORIDE: 106 meq/L (ref 96–112)
CO2: 22 meq/L (ref 19–32)
CREATININE: 0.8 mg/dL (ref 0.50–1.35)
Glucose, Bld: 93 mg/dL (ref 70–99)
Potassium: 3.9 mEq/L (ref 3.7–5.3)
Sodium: 139 mEq/L (ref 137–147)
Total Bilirubin: 4.1 mg/dL — ABNORMAL HIGH (ref 0.3–1.2)
Total Protein: 5.6 g/dL — ABNORMAL LOW (ref 6.0–8.3)

## 2013-09-10 LAB — CBC
HCT: 33.5 % — ABNORMAL LOW (ref 39.0–52.0)
Hemoglobin: 11.3 g/dL — ABNORMAL LOW (ref 13.0–17.0)
MCH: 28.8 pg (ref 26.0–34.0)
MCHC: 33.7 g/dL (ref 30.0–36.0)
MCV: 85.5 fL (ref 78.0–100.0)
Platelets: 110 10*3/uL — ABNORMAL LOW (ref 150–400)
RBC: 3.92 MIL/uL — ABNORMAL LOW (ref 4.22–5.81)
RDW: 14.1 % (ref 11.5–15.5)
WBC: 2.1 10*3/uL — AB (ref 4.0–10.5)

## 2013-09-10 LAB — LIPASE, BLOOD: Lipase: 26 U/L (ref 11–59)

## 2013-09-10 SURGERY — LAPAROSCOPIC CHOLECYSTECTOMY WITH INTRAOPERATIVE CHOLANGIOGRAM
Anesthesia: General

## 2013-09-10 MED ORDER — PROPOFOL 10 MG/ML IV BOLUS
INTRAVENOUS | Status: AC
  Filled 2013-09-10: qty 20

## 2013-09-10 MED ORDER — PROMETHAZINE HCL 25 MG/ML IJ SOLN: 6.2500 mg | INTRAMUSCULAR | Status: DC | PRN

## 2013-09-10 MED ORDER — CISATRACURIUM BESYLATE (PF) 10 MG/5ML IV SOLN
INTRAVENOUS | Status: DC | PRN
  Administered 2013-09-10: 2 mg via INTRAVENOUS
  Administered 2013-09-10 (×2): 6 mg via INTRAVENOUS
  Administered 2013-09-10: 4 mg via INTRAVENOUS

## 2013-09-10 MED ORDER — ATROPINE SULFATE 0.4 MG/ML IJ SOLN
INTRAMUSCULAR | Status: AC
  Filled 2013-09-10: qty 1

## 2013-09-10 MED ORDER — HYDROMORPHONE HCL PF 2 MG/ML IJ SOLN
INTRAMUSCULAR | Status: AC
  Filled 2013-09-10: qty 1

## 2013-09-10 MED ORDER — MIDAZOLAM HCL 5 MG/5ML IJ SOLN
INTRAMUSCULAR | Status: DC | PRN
  Administered 2013-09-10 (×2): 1 mg via INTRAVENOUS

## 2013-09-10 MED ORDER — SUCCINYLCHOLINE CHLORIDE 20 MG/ML IJ SOLN
INTRAMUSCULAR | Status: DC | PRN
  Administered 2013-09-10: 100 mg via INTRAVENOUS

## 2013-09-10 MED ORDER — LACTATED RINGERS IR SOLN
Status: DC | PRN
  Administered 2013-09-10: 1000 mL

## 2013-09-10 MED ORDER — GLYCOPYRROLATE 0.2 MG/ML IJ SOLN
INTRAMUSCULAR | Status: DC | PRN
Start: 2013-09-10 — End: 2013-09-10
  Administered 2013-09-10: .4 mg via INTRAVENOUS

## 2013-09-10 MED ORDER — LIDOCAINE HCL (CARDIAC) 20 MG/ML IV SOLN
INTRAVENOUS | Status: DC | PRN
  Administered 2013-09-10: 75 mg via INTRAVENOUS

## 2013-09-10 MED ORDER — LIDOCAINE HCL (CARDIAC) 20 MG/ML IV SOLN
INTRAVENOUS | Status: AC
  Filled 2013-09-10: qty 5

## 2013-09-10 MED ORDER — OXYCODONE HCL 5 MG/5ML PO SOLN: 5.0000 mg | Freq: Once | ORAL | Status: AC | PRN

## 2013-09-10 MED ORDER — HYDROMORPHONE HCL PF 1 MG/ML IJ SOLN
INTRAMUSCULAR | Status: DC | PRN
  Administered 2013-09-10 (×2): 1 mg via INTRAVENOUS

## 2013-09-10 MED ORDER — OXYCODONE HCL 5 MG PO TABS: 5.0000 mg | ORAL_TABLET | Freq: Once | ORAL | Status: AC | PRN

## 2013-09-10 MED ORDER — NEOSTIGMINE METHYLSULFATE 1 MG/ML IJ SOLN
INTRAMUSCULAR | Status: DC | PRN
  Administered 2013-09-10: 5 mg via INTRAVENOUS

## 2013-09-10 MED ORDER — EPHEDRINE SULFATE 50 MG/ML IJ SOLN
INTRAMUSCULAR | Status: AC
  Filled 2013-09-10: qty 1

## 2013-09-10 MED ORDER — DEXAMETHASONE SODIUM PHOSPHATE 10 MG/ML IJ SOLN
INTRAMUSCULAR | Status: DC | PRN
  Administered 2013-09-10: 10 mg via INTRAVENOUS

## 2013-09-10 MED ORDER — HYDROMORPHONE HCL PF 1 MG/ML IJ SOLN
INTRAMUSCULAR | Status: AC
  Filled 2013-09-10: qty 1

## 2013-09-10 MED ORDER — ONDANSETRON HCL 4 MG/2ML IJ SOLN
INTRAMUSCULAR | Status: AC
  Filled 2013-09-10: qty 2

## 2013-09-10 MED ORDER — FENTANYL CITRATE 0.05 MG/ML IJ SOLN
INTRAMUSCULAR | Status: DC | PRN
  Administered 2013-09-10 (×2): 50 ug via INTRAVENOUS
  Administered 2013-09-10 (×3): 100 ug via INTRAVENOUS
  Administered 2013-09-10 (×2): 50 ug via INTRAVENOUS
  Administered 2013-09-10 (×2): 100 ug via INTRAVENOUS
  Administered 2013-09-10: 50 ug via INTRAVENOUS

## 2013-09-10 MED ORDER — MIDAZOLAM HCL 2 MG/2ML IJ SOLN
INTRAMUSCULAR | Status: AC
  Filled 2013-09-10: qty 2

## 2013-09-10 MED ORDER — IOHEXOL 300 MG/ML  SOLN
INTRAMUSCULAR | Status: DC | PRN
  Administered 2013-09-10: 4 mL

## 2013-09-10 MED ORDER — NEOSTIGMINE METHYLSULFATE 1 MG/ML IJ SOLN
INTRAMUSCULAR | Status: AC
  Filled 2013-09-10: qty 10

## 2013-09-10 MED ORDER — CISATRACURIUM BESYLATE 20 MG/10ML IV SOLN
INTRAVENOUS | Status: AC
  Filled 2013-09-10: qty 10

## 2013-09-10 MED ORDER — LACTATED RINGERS IV SOLN
INTRAVENOUS | Status: DC | PRN
  Administered 2013-09-10 (×4): via INTRAVENOUS

## 2013-09-10 MED ORDER — HYDROMORPHONE HCL PF 1 MG/ML IJ SOLN
0.2500 mg | INTRAMUSCULAR | Status: DC | PRN
  Administered 2013-09-10 (×3): 0.5 mg via INTRAVENOUS

## 2013-09-10 MED ORDER — FENTANYL CITRATE 0.05 MG/ML IJ SOLN
INTRAMUSCULAR | Status: AC
  Filled 2013-09-10: qty 5

## 2013-09-10 MED ORDER — BUPIVACAINE-EPINEPHRINE PF 0.25-1:200000 % IJ SOLN
INTRAMUSCULAR | Status: AC
  Filled 2013-09-10: qty 30

## 2013-09-10 MED ORDER — PROPOFOL 10 MG/ML IV BOLUS
INTRAVENOUS | Status: DC | PRN
  Administered 2013-09-10: 200 mg via INTRAVENOUS

## 2013-09-10 MED ORDER — SODIUM CHLORIDE 0.9 % IV SOLN
INTRAVENOUS | Status: AC
  Filled 2013-09-10: qty 3

## 2013-09-10 MED ORDER — SODIUM CHLORIDE 0.9 % IJ SOLN
INTRAMUSCULAR | Status: AC
  Filled 2013-09-10: qty 10

## 2013-09-10 MED ORDER — BUPIVACAINE-EPINEPHRINE 0.25% -1:200000 IJ SOLN
INTRAMUSCULAR | Status: DC | PRN
  Administered 2013-09-10: 24 mL

## 2013-09-10 MED ORDER — MEPERIDINE HCL 50 MG/ML IJ SOLN: 6.2500 mg | INTRAMUSCULAR | Status: DC | PRN

## 2013-09-10 MED ORDER — SODIUM CHLORIDE 0.9 % IR SOLN
Status: DC | PRN
  Administered 2013-09-10 (×2): 1000 mL

## 2013-09-10 MED ORDER — DEXAMETHASONE SODIUM PHOSPHATE 10 MG/ML IJ SOLN
INTRAMUSCULAR | Status: AC
  Filled 2013-09-10: qty 1

## 2013-09-10 MED ORDER — GLYCOPYRROLATE 0.2 MG/ML IJ SOLN
INTRAMUSCULAR | Status: AC
  Filled 2013-09-10: qty 3

## 2013-09-10 MED ORDER — EPHEDRINE SULFATE 50 MG/ML IJ SOLN
INTRAMUSCULAR | Status: DC | PRN
  Administered 2013-09-10: 5 mg via INTRAVENOUS

## 2013-09-10 MED ORDER — LIDOCAINE HCL 2 % EX GEL
CUTANEOUS | Status: AC
  Filled 2013-09-10: qty 10

## 2013-09-10 MED ORDER — ONDANSETRON HCL 4 MG/2ML IJ SOLN
INTRAMUSCULAR | Status: DC | PRN
  Administered 2013-09-10 (×2): 2 mg via INTRAVENOUS

## 2013-09-10 SURGICAL SUPPLY — 48 items
ADH SKN CLS APL DERMABOND .7 (GAUZE/BANDAGES/DRESSINGS)
APPLIER CLIP ROT 10 11.4 M/L (STAPLE) ×2
APR CLP MED LRG 11.4X10 (STAPLE) ×1
BAG SPEC RTRVL LRG 6X4 10 (ENDOMECHANICALS) ×1
CANISTER SUCTION 2500CC (MISCELLANEOUS) ×1 IMPLANT
CATH REDDICK CHOLANGI 4FR 50CM (CATHETERS) ×2 IMPLANT
CHLORAPREP W/TINT 26ML (MISCELLANEOUS) ×2 IMPLANT
CLIP APPLIE ROT 10 11.4 M/L (STAPLE) ×1 IMPLANT
COVER MAYO STAND STRL (DRAPES) ×2 IMPLANT
DECANTER SPIKE VIAL GLASS SM (MISCELLANEOUS) ×1 IMPLANT
DERMABOND ADVANCED (GAUZE/BANDAGES/DRESSINGS)
DERMABOND ADVANCED .7 DNX12 (GAUZE/BANDAGES/DRESSINGS) ×1 IMPLANT
DRAPE C-ARM 42X120 X-RAY (DRAPES) ×2 IMPLANT
DRAPE LAPAROSCOPIC ABDOMINAL (DRAPES) ×2 IMPLANT
DRAPE WARM FLUID 44X44 (DRAPE) ×1 IMPLANT
ELECT BLADE TIP CTD 4 INCH (ELECTRODE) ×1 IMPLANT
ELECT REM PT RETURN 9FT ADLT (ELECTROSURGICAL) ×2
ELECTRODE REM PT RTRN 9FT ADLT (ELECTROSURGICAL) ×1 IMPLANT
GLOVE BIO SURGEON STRL SZ7.5 (GLOVE) ×12 IMPLANT
GOWN STRL REUS W/ TWL XL LVL3 (GOWN DISPOSABLE) IMPLANT
GOWN STRL REUS W/TWL LRG LVL3 (GOWN DISPOSABLE) ×5 IMPLANT
GOWN STRL REUS W/TWL XL LVL3 (GOWN DISPOSABLE) ×1 IMPLANT
HEMOSTAT SURGICEL 4X8 (HEMOSTASIS) ×2 IMPLANT
IV CATH 14GX2 1/4 (CATHETERS) ×2 IMPLANT
KIT BASIN OR (CUSTOM PROCEDURE TRAY) ×2 IMPLANT
NS IRRIG 1000ML POUR BTL (IV SOLUTION) ×2 IMPLANT
PENCIL BUTTON HOLSTER BLD 10FT (ELECTRODE) ×1 IMPLANT
POUCH SPECIMEN RETRIEVAL 10MM (ENDOMECHANICALS) ×1 IMPLANT
SET IRRIG TUBING LAPAROSCOPIC (IRRIGATION / IRRIGATOR) ×2 IMPLANT
SLEEVE XCEL OPT CAN 5 100 (ENDOMECHANICALS) ×2 IMPLANT
SOLUTION ANTI FOG 6CC (MISCELLANEOUS) ×2 IMPLANT
SPONGE GAUZE 4X4 12PLY (GAUZE/BANDAGES/DRESSINGS) ×1 IMPLANT
SPONGE LAP 18X18 X RAY DECT (DISPOSABLE) ×3 IMPLANT
SUT MNCRL AB 4-0 PS2 18 (SUTURE) ×2 IMPLANT
SUT PDS AB 1 TP1 96 (SUTURE) ×2 IMPLANT
SUT SILK 2 0 (SUTURE) ×2
SUT SILK 2-0 30XBRD TIE 12 (SUTURE) IMPLANT
SYR BULB IRRIGATION 50ML (SYRINGE) ×1 IMPLANT
TOWEL OR 17X26 10 PK STRL BLUE (TOWEL DISPOSABLE) ×2 IMPLANT
TOWEL OR NON WOVEN STRL DISP B (DISPOSABLE) ×2 IMPLANT
TRAY LAP CHOLE (CUSTOM PROCEDURE TRAY) ×2 IMPLANT
TROCAR BLADELESS OPT 5 100 (ENDOMECHANICALS) ×1 IMPLANT
TROCAR BLADELESS OPT 5 75 (ENDOMECHANICALS) ×1 IMPLANT
TROCAR SLEEVE XCEL 5X75 (ENDOMECHANICALS) ×1 IMPLANT
TROCAR XCEL BLUNT TIP 100MML (ENDOMECHANICALS) ×2 IMPLANT
TROCAR XCEL NON-BLD 11X100MML (ENDOMECHANICALS) ×2 IMPLANT
TUBING INSUFFLATION 10FT LAP (TUBING) ×2 IMPLANT
YANKAUER SUCT BULB TIP 10FT TU (MISCELLANEOUS) ×1 IMPLANT

## 2013-09-10 NOTE — Op Note (Signed)
09/07/2013 - 09/10/2013  10:11 AM  PATIENT:  George Marshall  63 y.o. male  PRE-OPERATIVE DIAGNOSIS:  cholecystitis  POST-OPERATIVE DIAGNOSIS:  cholecystitis, cholelitiasis  PROCEDURE:  Procedure(s): Attempted LAPAROSCOPIC CHOLECYSTECTOMY  And subsequent open cholecystectomy WITH INTRAOPERATIVE CHOLANGIOGRAM (N/A)  SURGEON:  Surgeon(s) and Role:    * Merrie Roof, MD - Primary  PHYSICIAN ASSISTANT:   ASSISTANTS: none   ANESTHESIA:   general  EBL:  Total I/O In: 2000 [I.V.:2000] Out: 250 [Blood:250]  BLOOD ADMINISTERED:none  DRAINS: none   LOCAL MEDICATIONS USED:  MARCAINE     SPECIMEN:  Source of Specimen:  gallbladder  DISPOSITION OF SPECIMEN:  PATHOLOGY  COUNTS:  YES  TOURNIQUET:  * No tourniquets in log *  DICTATION: .Dragon Dictation After informed consent was obtained the patient was brought to the operating room and placed in the supine position on the operating room table. After adequate induction of general anesthesia the patient was prepped with ChloraPrep, lap and dry, and draped in usual sterile manner. A site was chosen in the right upper quadrant for placement of a 5 mm Optiview port. This area was infiltrated with quarter Marcaine. A small stab incision was made with a 15 blade knife. A 5 mm Optiview port and camera were used to bluntly dissect through all the layers of the abdominal wall under direct vision until access was gained to the abdominal cavity. The abdomen was the insufflated with carbon dioxide without difficulty. 2 other 5 mm ports were placed under direct vision in the lower abdomen. A 10 mm port was placed in the epigastric location also under direct vision without difficulty. The omentum was adhered to the inferior edge of the liver which made visualization of the gallbladder difficult. I was able to find the stone the gallbladder and elevated anteriorly and superiorly. I was able to dissect much of the omentum away from the liver edge using a  hook cautery. We did identify the stomach and duodenum enterocolonic care to make sure that we did not get close to these structures during the dissection. Unfortunately we could not reliably see the inferior edge of the gallbladder to make sure that it was not adherent to the duodenum. Because of this we decided to stop the laparoscopy and open with a right subcostal incision. This incision was made with a 10 blade knife. This incision was carried through the skin and subcutaneous tissue sharply with the electrocautery. The fascia of the abdominal wall and the rectus muscles were also incised sharply with the electrocautery. Once this was accomplished we were then able to access the abdominal cavity and open the rest of the incision under direct vision. A Bookwalter retractor was deployed. After blunt dissection I felt comfortable that the duodenum was down out of the way and not close to where we needed to dissect. We then took the gallbladder off the liver using a top-down technique and a combination of blunt dissection with a Kelly clamp and some blunt finger dissection as well as sharp dissection with the electrocautery. Once the dissection was brought down to the neck of the gallbladder we were able to identify the cystic duct and cystic artery. A small nick was made in the cystic duct and a taut cholangiogram catheter was inserted into the cystic duct and anchored in place with a bile duct clamp. A cholangiogram was obtained that showed no filling defects adequate length on the cystic duct and good emptying into the duodenum. We then removed the  catheter and clamped. The cystic duct and cystic artery were controlled with a 2-0 silk tie it was passed around each structure. Once this was accomplished the gallbladder was then able to be removed from the patient. The liver bed where the gallbladder had been was fulgurated with the cautery until it was hemostatic. The wound was irrigated with copious amounts of  saline. The liver bed was then packed with a piece of Surgicel. The fascia of the anterior abdominal wall was then closed with 2 layers of #1 double stranded looped PDS suture. Each layer was irrigated as it was closed. The skin was then closed with staples. The patient tolerated the procedure well. At the end of the case on needle sponge and counts were correct. The patient was then awakened and taken to recovery in stable condition.  PLAN OF CARE: Admit to inpatient   PATIENT DISPOSITION:  PACU - hemodynamically stable.   Delay start of Pharmacological VTE agent (>24hrs) due to surgical blood loss or risk of bleeding: yes

## 2013-09-10 NOTE — Preoperative (Signed)
Beta Blockers   Reason not to administer Beta Blockers:Not Applicable 

## 2013-09-10 NOTE — Transfer of Care (Signed)
Immediate Anesthesia Transfer of Care Note  Patient: George Marshall  Procedure(s) Performed: Procedure(s): LAPAROSCOPIC CHOLECYSTECTOMY, OPEN CHOLECYSTECTOMY WITH INTRAOPERATIVE CHOLANGIOGRAM (N/A)  Patient Location: PACU  Anesthesia Type:General  Level of Consciousness: awake, alert , oriented and patient cooperative  Airway & Oxygen Therapy: Patient Spontanous Breathing and Patient connected to face mask oxygen  Post-op Assessment: Report given to PACU RN, Post -op Vital signs reviewed and stable and Patient moving all extremities  Post vital signs: Reviewed and stable  Complications: No apparent anesthesia complications

## 2013-09-10 NOTE — Interval H&P Note (Signed)
History and Physical Interval Note:  09/10/2013 7:12 AM  George Marshall  has presented today for surgery, with the diagnosis of cholecystitis  The various methods of treatment have been discussed with the patient and family. After consideration of risks, benefits and other options for treatment, the patient has consented to  Procedure(s): LAPAROSCOPIC CHOLECYSTECTOMY WITH INTRAOPERATIVE CHOLANGIOGRAM (N/A) as a surgical intervention .  The patient's history has been reviewed, patient examined, no change in status, stable for surgery.  I have reviewed the patient's chart and labs.  Questions were answered to the patient's satisfaction.     TOTH III,PAUL S

## 2013-09-10 NOTE — Anesthesia Postprocedure Evaluation (Signed)
Anesthesia Post Note  Patient: George Marshall  Procedure(s) Performed: Procedure(s) (LRB): LAPAROSCOPIC CHOLECYSTECTOMY, OPEN CHOLECYSTECTOMY WITH INTRAOPERATIVE CHOLANGIOGRAM (N/A)  Anesthesia type: General  Patient location: PACU  Post pain: Pain level controlled  Post assessment: Post-op Vital signs reviewed  Last Vitals: BP 126/73  Pulse 62  Temp(Src) 37 C (Oral)  Resp 18  Ht 5\' 11"  (1.803 m)  Wt 156 lb (70.761 kg)  BMI 21.77 kg/m2  SpO2 100%  Post vital signs: Reviewed  Level of consciousness: sedated  Complications: No apparent anesthesia complications

## 2013-09-10 NOTE — Anesthesia Procedure Notes (Signed)
Procedure Name: Intubation Date/Time: 09/10/2013 7:51 AM Performed by: Ofilia Neas Pre-anesthesia Checklist: Emergency Drugs available, Timeout performed, Suction available, Patient identified and Patient being monitored Patient Re-evaluated:Patient Re-evaluated prior to inductionOxygen Delivery Method: Circle system utilized Preoxygenation: Pre-oxygenation with 100% oxygen Intubation Type: IV induction and Cricoid Pressure applied Ventilation: Mask ventilation without difficulty Laryngoscope Size: Mac and 4 Grade View: Grade II Tube type: Oral Tube size: 7.5 mm Number of attempts: 1 Airway Equipment and Method: Stylet

## 2013-09-10 NOTE — Progress Notes (Signed)
Patient off floor when I went to see him, presumably to have his cholecystectomy.  LFTs have downtrended significantly.  Will revisit tomorrow.

## 2013-09-10 NOTE — H&P (View-Only) (Signed)
1 Day Post-Op  Subjective: Pt's pain is improved, less yellow per girlfriend.  Ambulating well.  No IS in room.  Tolerating clears.  Understanding about surgery being moved.    Objective: Vital signs in last 24 hours: Temp:  [98.3 F (36.8 C)-99 F (37.2 C)] 98.3 F (36.8 C) (03/25 0605) Pulse Rate:  [72-78] 77 (03/25 0605) Resp:  [16] 16 (03/25 0605) BP: (109-110)/(68-73) 110/73 mmHg (03/25 0605) SpO2:  [96 %] 96 % (03/25 0605) Last BM Date: 09/06/13  Intake/Output from previous day: 03/24 0701 - 03/25 0700 In: 4027 [P.O.:1302; I.V.:2725] Out: 2725 [Urine:2725] Intake/Output this shift: Total I/O In: 400 [I.V.:400] Out: 300 [Urine:300]  PE: Gen:  Alert, NAD, pleasant Abd: Soft, mild epigastric tenderness, ND, +BS, no HSM  Lab Results:   Recent Labs  09/08/13 0437 09/09/13 0435  WBC 2.4* 2.4*  HGB 11.8* 11.3*  HCT 34.0* 33.6*  PLT 112* 107*   BMET  Recent Labs  09/08/13 0437 09/09/13 0435  NA 136* 140  K 3.9 3.8  CL 100 104  CO2 24 23  GLUCOSE 95 91  BUN 6 7  CREATININE 0.87 0.84  CALCIUM 8.1* 8.2*   PT/INR  Recent Labs  09/07/13 0855  LABPROT 14.0  INR 1.10   CMP     Component Value Date/Time   NA 140 09/09/2013 0435   K 3.8 09/09/2013 0435   CL 104 09/09/2013 0435   CO2 23 09/09/2013 0435   GLUCOSE 91 09/09/2013 0435   GLUCOSE 90 05/17/2006 0822   BUN 7 09/09/2013 0435   CREATININE 0.84 09/09/2013 0435   CREATININE 0.87 03/08/2012 1332   CALCIUM 8.2* 09/09/2013 0435   PROT 5.7* 09/09/2013 0435   ALBUMIN 2.6* 09/09/2013 0435   AST 55* 09/09/2013 0435   ALT 64* 09/09/2013 0435   ALKPHOS 164* 09/09/2013 0435   BILITOT 9.5* 09/09/2013 0435   GFRNONAA >90 09/09/2013 0435   GFRAA >90 09/09/2013 0435   Lipase     Component Value Date/Time   LIPASE 43 09/09/2013 0435       Studies/Results: Dg Ercp With Sphincterotomy  09/08/2013   CLINICAL DATA:  ERCP with sphincterotomy and stone removal  EXAM: ERCP  TECHNIQUE: Multiple spot images obtained  with the fluoroscopic device and submitted for interpretation post-procedure.  COMPARISON:  US ABDOMEN COMPLETE dated 09/07/2013; CT ABD/PELVIS W CM dated 09/07/2013  FINDINGS: Three spot intraoperative fluoroscopic images of the right upper abdominal quadrant during ERCP are provided for review.  Initial image demonstrates an ERCP probe overlying the right upper abdominal quadrant. There is selective cannulation and opacification of the common bile duct which appears mildly dilated. No discrete filling defects are identified within the opacified portion of the common bile duct though note, the distal aspect of the common bile duct the softly evaluated due to suboptimal distention.  Subsequent images demonstrate instillation of a sphincterotomy balloon within the central aspect of the common bile / proper hepatic duct.  There is no definitive opacification of the pancreatic or cystic ducts. There is mild dilatation of the central aspect of the intrahepatic biliary tree which appears nondilated.  IMPRESSION: ERCP with biliary sphincterotomy as above.  These images were submitted for radiologic interpretation only. Please see the procedural report for the amount of contrast and the fluoroscopy time utilized.   Electronically Signed   By: Sandi Mariscal M.D.   On: 09/08/2013 13:28    Anti-infectives: Anti-infectives   Start     Dose/Rate Route Frequency Ordered Stop  09/08/13 0845  Ampicillin-Sulbactam (UNASYN) 3 g in sodium chloride 0.9 % 100 mL IVPB  Status:  Discontinued     3 g 100 mL/hr over 60 Minutes Intravenous Every 6 hours 09/08/13 0843 09/08/13 1947   09/08/13 0815  ciprofloxacin (CIPRO) IVPB 400 mg  Status:  Discontinued     400 mg 200 mL/hr over 60 Minutes Intravenous  Once 09/08/13 0806 09/08/13 0806   09/07/13 0900  Ampicillin-Sulbactam (UNASYN) 3 g in sodium chloride 0.9 % 100 mL IVPB     3 g 100 mL/hr over 60 Minutes Intravenous Every 6 hours 09/07/13 0815         Assessment/Plan 1.  Cholecystitis, cholelithiasis, choledocholithiasis   ERCP with sphincterotomy/papillotomy, ERCP with bile duct stone extraction. 09/08/13, DR. Arta Silence  2. Rheumatoid Arthritis on Humira  3. COPD/hx of tobacco use.  4. GERD  5. Hx of B12 deficiency, not currently on treatment.   Plan: Continue antibiotics, Clear liquids today NPO after MN.  Plan for OR tomorrow given patients continued elevated LFT's and bleeding yesterday. Dr. Erlinda Hong note says there was some bleeding with sphincterotomy, so will hold heparin, he does have SCD's ordered for DVT prophylaxis.  Recheck labs in am.  Practice IS.  Gave girlfriend note regarding his surgery being postponed.    LOS: 2 days    Coralie Keens 09/09/2013, 12:01 PM Pager: [phone]

## 2013-09-11 ENCOUNTER — Encounter (HOSPITAL_COMMUNITY): Payer: Self-pay | Admitting: General Surgery

## 2013-09-11 MED ORDER — HEPARIN SODIUM (PORCINE) 5000 UNIT/ML IJ SOLN
5000.0000 [IU] | Freq: Three times a day (TID) | INTRAMUSCULAR | Status: DC
  Administered 2013-09-11 – 2013-09-14 (×10): 5000 [IU] via SUBCUTANEOUS
  Filled 2013-09-11 (×13): qty 1

## 2013-09-11 NOTE — Progress Notes (Signed)
1 Day Post-Op  Subjective: No flatus but would really like to get something to eat.  Wound looks good some bowel sound.    Objective: Vital signs in last 24 hours: Temp:  [97.7 F (36.5 C)-98.6 F (37 C)] 97.9 F (36.6 C) (03/27 0612) Pulse Rate:  [60-71] 67 (03/27 0612) Resp:  [17-18] 18 (03/27 0612) BP: (113-126)/(61-76) 125/76 mmHg (03/27 0612) SpO2:  [98 %-100 %] 98 % (03/27 0612) Last BM Date: 09/06/13 Afebrile, VSS No labs NPO  Intake/Output from previous day: 03/26 0701 - 03/27 0700 In: 4500 [I.V.:4500] Out: 3300 [Urine:3050; Blood:250] Intake/Output this shift: Total I/O In: -  Out: 450 [Urine:450]  General appearance: alert, cooperative and no distress Resp: clear to auscultation bilaterally GI: sore few bS, no flatus  Lab Results:   Recent Labs  09/09/13 0435 09/10/13 0454  WBC 2.4* 2.1*  HGB 11.3* 11.3*  HCT 33.6* 33.5*  PLT 107* 110*    BMET  Recent Labs  09/09/13 0435 09/10/13 0454  NA 140 139  K 3.8 3.9  CL 104 106  CO2 23 22  GLUCOSE 91 93  BUN 7 6  CREATININE 0.84 0.80  CALCIUM 8.2* 8.0*   PT/INR No results found for this basename: LABPROT, INR,  in the last 72 hours   Recent Labs Lab 09/07/13 0045 09/08/13 0437 09/09/13 0435 09/10/13 0454  AST 130* 92* 55* 30  ALT 55* 81* 64* 48  ALKPHOS 177* 158* 164* 165*  BILITOT 4.8* 12.2* 9.5* 4.1*  PROT 7.4 5.6* 5.7* 5.6*  ALBUMIN 3.7 2.7* 2.6* 2.5*     Lipase     Component Value Date/Time   LIPASE 26 09/10/2013 0454     Studies/Results: Dg Cholangiogram Operative  09/10/2013   CLINICAL DATA:  Intraoperative cholangiogram.  Acute cholecystitis.  EXAM: INTRAOPERATIVE CHOLANGIOGRAM  TECHNIQUE: Cholangiographic images from the C-arm fluoroscopic device were submitted for interpretation post-operatively. Please see the procedural report for the amount of contrast and the fluoroscopy time utilized.  COMPARISON:  09/08/2013  FINDINGS: Intraoperative cholangiogram shows a smooth  normal caliber common bile duct, common hepatic duct and intrahepatic ducts. No filling defect is seen to suggest a duct stone. Contrast freely enters the duodenum.  IMPRESSION: No evidence of a retained common bile duct stone.   Electronically Signed   By: Lajean Manes M.D.   On: 09/10/2013 10:32    Medications: . ampicillin-sulbactam (UNASYN) IV  3 g Intravenous Q6H  . Chlorhexidine Gluconate Cloth  6 each Topical Daily  . mupirocin ointment  1 application Nasal BID  . pantoprazole (PROTONIX) IV  40 mg Intravenous Q12H    Assessment/Plan 1. Cholecystitis, cholelithiasis, choledocholithiasis  A)  ERCP with sphincterotomy/papillotomy, ERCP with bile duct stone extraction,   09/08/13 Dr. Paulita Fujita B)   Attempted LAPAROSCOPIC CHOLECYSTECTOMY And subsequent open  cholecystectomy WITH INTRAOPERATIVE CHOLANGIOGRAM, Merrie Roof, MD ,   09/10/2013  2. Rheumatoid Arthritis on Humira  3. COPD/hx of tobacco use.  4. GERD  5. Hx of B12 deficiency, not currently on treatment.    Plan:  Sips and chips, continue antibiotics.   Recheck labs in Am.  LOS: 4 days    Marte Celani 09/11/2013

## 2013-09-12 DIAGNOSIS — K8043 Calculus of bile duct with acute cholecystitis with obstruction: Secondary | ICD-10-CM

## 2013-09-12 HISTORY — DX: Calculus of bile duct with acute cholecystitis with obstruction: K80.43

## 2013-09-12 LAB — COMPREHENSIVE METABOLIC PANEL
ALT: 62 U/L — ABNORMAL HIGH (ref 0–53)
AST: 54 U/L — ABNORMAL HIGH (ref 0–37)
Albumin: 2.4 g/dL — ABNORMAL LOW (ref 3.5–5.2)
Alkaline Phosphatase: 131 U/L — ABNORMAL HIGH (ref 39–117)
BUN: 9 mg/dL (ref 6–23)
CO2: 24 meq/L (ref 19–32)
CREATININE: 0.82 mg/dL (ref 0.50–1.35)
Calcium: 7.9 mg/dL — ABNORMAL LOW (ref 8.4–10.5)
Chloride: 99 mEq/L (ref 96–112)
GFR calc non Af Amer: >90 mL/min (ref >90)
GLUCOSE: 80 mg/dL (ref 70–99)
Potassium: 3.9 mEq/L (ref 3.7–5.3)
Sodium: 136 mEq/L — ABNORMAL LOW (ref 137–147)
TOTAL PROTEIN: 5.2 g/dL — AB (ref 6.0–8.3)
Total Bilirubin: 3 mg/dL — ABNORMAL HIGH (ref 0.3–1.2)

## 2013-09-12 LAB — CBC
HCT: 30.4 % — ABNORMAL LOW (ref 39.0–52.0)
Hemoglobin: 10.3 g/dL — ABNORMAL LOW (ref 13.0–17.0)
MCH: 29.1 pg (ref 26.0–34.0)
MCHC: 33.9 g/dL (ref 30.0–36.0)
MCV: 85.9 fL (ref 78.0–100.0)
PLATELETS: 118 10*3/uL — AB (ref 150–400)
RBC: 3.54 MIL/uL — AB (ref 4.22–5.81)
RDW: 14.1 % (ref 11.5–15.5)
WBC: 2.8 10*3/uL — ABNORMAL LOW (ref 4.0–10.5)

## 2013-09-12 MED ORDER — PANTOPRAZOLE SODIUM 40 MG PO TBEC
40.0000 mg | DELAYED_RELEASE_TABLET | Freq: Two times a day (BID) | ORAL | Status: DC
  Administered 2013-09-12 – 2013-09-14 (×4): 40 mg via ORAL
  Filled 2013-09-12 (×7): qty 1

## 2013-09-12 NOTE — Progress Notes (Signed)
PHARMACY BRIEF NOTE:  PROTONIX IV TO PO  The patient is receiving Protonix by the intravenous route.  Protonix IV is in critically short supply.  Based on criteria approved by the Pharmacy and Therapeutics Committee, the medication is being converted to the equivalent oral dosage form.  If there are questions about this conversion, please contact the Pharmacy Department (phone [phone]).    Thank you- Clayburn Pert, PharmD

## 2013-09-12 NOTE — Progress Notes (Signed)
[place], MD, [address] [place], [place] [address] Phone: [phone] FAX: [phone]    MALIQUE DRISKILL [id number] Nov 10, 1950  CARE TEAM:  PCP: Tammi Sou, MD  Outpatient Care Team: Patient Care Team: Tammi Sou, MD as PCP - General (Family Medicine) Merrie Roof, MD as Consulting Physician (General Surgery) Wonda Horner, MD as Consulting Physician (Gastroenterology) Eston Esters, MD as Consulting Physician (Hematology and Oncology) Arta Silence, MD as Consulting Physician (Gastroenterology)  Inpatient Treatment Team: Treatment Team: Attending Provider: Nolon Nations, MD; Consulting Physician: Nolon Nations, MD; Technician: Leda Quail, NT; Registered Nurse: Emeterio Reeve, RN; Consulting Physician: Arta Silence, MD; Registered Nurse: Derek Jack, RN; Technician: Senaida Ores Debbink, NT   Subjective:  Feeling better.  Walking more.  Tolerating sips  Objective:  Vital signs:  Filed Vitals:   09/11/13 0612 09/11/13 1400 09/11/13 2020 09/12/13 0607  BP: 125/76 120/66 110/63 125/68  Pulse: 67 76 79 86  Temp: 97.9 F (36.6 C) 98 F (36.7 C) 98.2 F (36.8 C) 98 F (36.7 C)  TempSrc: Oral Oral Oral Oral  Resp: 18 18 18 18   Height:      Weight:      SpO2: 98% 99% 95% 97%    Last BM Date: 09/06/13  Intake/Output   Yesterday:  03/27 0701 - 03/28 0700 In: 2800 [I.V.:2800] Out: 3050 [Urine:3050] This shift:     Bowel function:  Flatus:  n  BM: n  Drain: n/a  Physical Exam:  General: Pt awake/alert/oriented x4 in no acute distress Eyes: PERRL, normal EOM.  Sclera clear.  No icterus Neuro: CN II-XII intact w/o focal sensory/motor deficits. Lymph: No head/neck/groin lymphadenopathy Psych:  No delerium/psychosis/paranoia HENT: Normocephalic, Mucus membranes moist.  No thrush Neck: Supple, No tracheal deviation Chest: No chest wall pain w good  excursion CV:  Pulses intact.  Regular rhythm MS: Normal AROM mjr joints.  No obvious deformity Abdomen: Soft.  Nondistended.  Incisions c/d/i.  Mildly tender at incisions only.  No evidence of peritonitis.  No incarcerated hernias. Ext:  SCDs BLE.  No mjr edema.  No cyanosis Skin: No petechiae / purpura   Problem List:   Principal Problem:   Acute cholecystitis s/p lap/open cholecystectomy 09/10/2013 Active Problems:   GERD   Neutropenia   B12 deficiency   Iron deficiency   COPD (chronic obstructive pulmonary disease)   Rheumatoid arthritis(714.0)   Choledocholithiasis with acute cholangitis s/p ERCP 09/08/2013   Assessment  [name]  63 y.o. male  2 Days Post-Op  Procedure(s): LAPAROSCOPIC CHOLECYSTECTOMY, OPEN CHOLECYSTECTOMY WITH INTRAOPERATIVE CHOLANGIOGRAM  Ileus status post ERCP for cholangitis due to choledocholithiasis.  Status post lap covered open cholecystectomy for severe cholecystitis.  Plan:  Try clear with good liquids since no nausea and doing well.  Continue IV antibiotics.  Anticipate 10 days total given severe cholangitis and significant cholecystitis.  COPD stable.  Arthritis stable.  Follow jaundice.  Seems to be improving.  Neutropenia stable.  -VTE prophylaxis- SCDs, etc  -mobilize as tolerated to help recovery  Adin Hector, M.D., F.A.C.S. Gastrointestinal and Minimally Invasive Surgery [facility] [address], [place], [place] [address] [phone] Main / Paging   09/12/2013   Results:   Labs: Results for orders placed during the hospital encounter of 09/07/13 (from the past 48 hour(s))  CBC     Status: Abnormal   Collection  Time    09/12/13  4:58 AM      Result Value Ref Range   WBC 2.8 (*) 4.0 - 10.5 K/uL   RBC 3.54 (*) 4.22 - 5.81 MIL/uL   Hemoglobin 10.3 (*) 13.0 - 17.0 g/dL   HCT 30.4 (*) 39.0 - 52.0 %   MCV 85.9  78.0 - 100.0 fL   MCH 29.1  26.0 - 34.0 pg   MCHC 33.9   30.0 - 36.0 g/dL   RDW 14.1  11.5 - 15.5 %   Platelets 118 (*) 150 - 400 K/uL   Comment: CONSISTENT WITH PREVIOUS RESULT  COMPREHENSIVE METABOLIC PANEL     Status: Abnormal   Collection Time    09/12/13  4:58 AM      Result Value Ref Range   Sodium 136 (*) 137 - 147 mEq/L   Potassium 3.9  3.7 - 5.3 mEq/L   Chloride 99  96 - 112 mEq/L   CO2 24  19 - 32 mEq/L   Glucose, Bld 80  70 - 99 mg/dL   BUN 9  6 - 23 mg/dL   Creatinine, Ser 0.82  0.50 - 1.35 mg/dL   Calcium 7.9 (*) 8.4 - 10.5 mg/dL   Total Protein 5.2 (*) 6.0 - 8.3 g/dL   Albumin 2.4 (*) 3.5 - 5.2 g/dL   AST 54 (*) 0 - 37 U/L   ALT 62 (*) 0 - 53 U/L   Alkaline Phosphatase 131 (*) 39 - 117 U/L   Total Bilirubin 3.0 (*) 0.3 - 1.2 mg/dL   GFR calc non Af Amer >90  >90 mL/min   GFR calc Af Amer >90  >90 mL/min   Comment: (NOTE)     The eGFR has been calculated using the CKD EPI equation.     This calculation has not been validated in all clinical situations.     eGFR's persistently <90 mL/min signify possible Chronic Kidney     Disease.    Imaging / Studies: Dg Cholangiogram Operative  09/10/2013   CLINICAL DATA:  Intraoperative cholangiogram.  Acute cholecystitis.  EXAM: INTRAOPERATIVE CHOLANGIOGRAM  TECHNIQUE: Cholangiographic images from the C-arm fluoroscopic device were submitted for interpretation post-operatively. Please see the procedural report for the amount of contrast and the fluoroscopy time utilized.  COMPARISON:  09/08/2013  FINDINGS: Intraoperative cholangiogram shows a smooth normal caliber common bile duct, common hepatic duct and intrahepatic ducts. No filling defect is seen to suggest a duct stone. Contrast freely enters the duodenum.  IMPRESSION: No evidence of a retained common bile duct stone.   Electronically Signed   By: Lajean Manes M.D.   On: 09/10/2013 10:32    Medications / Allergies: per chart  Antibiotics: Anti-infectives   Start     Dose/Rate Route Frequency Ordered Stop   09/08/13 0845   Ampicillin-Sulbactam (UNASYN) 3 g in sodium chloride 0.9 % 100 mL IVPB  Status:  Discontinued     3 g 100 mL/hr over 60 Minutes Intravenous Every 6 hours 09/08/13 0843 09/08/13 1947   09/08/13 0815  ciprofloxacin (CIPRO) IVPB 400 mg  Status:  Discontinued     400 mg 200 mL/hr over 60 Minutes Intravenous  Once 09/08/13 0806 09/08/13 0806   09/07/13 0900  Ampicillin-Sulbactam (UNASYN) 3 g in sodium chloride 0.9 % 100 mL IVPB     3 g 100 mL/hr over 60 Minutes Intravenous Every 6 hours 09/07/13 0815         Note: This dictation was prepared with Dragon/digital  dictation along with [employer]. Any transcriptional errors that result from this process are unintentional.

## 2013-09-13 NOTE — Progress Notes (Signed)
3 Days Post-Op  Subjective: Feels well Tolerating clears with no nausea or bloating but has not passed flatus  Objective: Vital signs in last 24 hours: Temp:  [98 F (36.7 C)-98.3 F (36.8 C)] 98.3 F (36.8 C) (03/29 0609) Pulse Rate:  [72-90] 83 (03/29 0609) Resp:  [16-18] 18 (03/29 0609) BP: (110-128)/(64-73) 117/70 mmHg (03/29 0609) SpO2:  [95 %-97 %] 95 % (03/29 0609) Last BM Date: 09/06/13  Intake/Output from previous day: 03/28 0701 - 03/29 0700 In: 2400 [I.V.:2400] Out: 2075 [Urine:2075] Intake/Output this shift:   Looks good Abdomen soft with good BS, incision clean  Lab Results:   Recent Labs  09/12/13 0458  WBC 2.8*  HGB 10.3*  HCT 30.4*  PLT 118*   BMET  Recent Labs  09/12/13 0458  NA 136*  K 3.9  CL 99  CO2 24  GLUCOSE 80  BUN 9  CREATININE 0.82  CALCIUM 7.9*   PT/INR No results found for this basename: LABPROT, INR,  in the last 72 hours ABG No results found for this basename: PHART, PCO2, PO2, HCO3,  in the last 72 hours  Studies/Results: No results found.  Anti-infectives: Anti-infectives   Start     Dose/Rate Route Frequency Ordered Stop   09/08/13 0845  Ampicillin-Sulbactam (UNASYN) 3 g in sodium chloride 0.9 % 100 mL IVPB  Status:  Discontinued     3 g 100 mL/hr over 60 Minutes Intravenous Every 6 hours 09/08/13 0843 09/08/13 1947   09/08/13 0815  ciprofloxacin (CIPRO) IVPB 400 mg  Status:  Discontinued     400 mg 200 mL/hr over 60 Minutes Intravenous  Once 09/08/13 0806 09/08/13 0806   09/07/13 0900  Ampicillin-Sulbactam (UNASYN) 3 g in sodium chloride 0.9 % 100 mL IVPB     3 g 100 mL/hr over 60 Minutes Intravenous Every 6 hours 09/07/13 0815 09/17/13 0859      Assessment/Plan: s/p Procedure(s): LAPAROSCOPIC CHOLECYSTECTOMY, OPEN CHOLECYSTECTOMY WITH INTRAOPERATIVE CHOLANGIOGRAM (N/A)  Will try full liquids Continuing all other care  LOS: 6 days    Soren Lazarz A 09/13/2013

## 2013-09-14 LAB — BASIC METABOLIC PANEL
BUN: 5 mg/dL — ABNORMAL LOW (ref 6–23)
CHLORIDE: 100 meq/L (ref 96–112)
CO2: 27 mEq/L (ref 19–32)
Calcium: 8.2 mg/dL — ABNORMAL LOW (ref 8.4–10.5)
Creatinine, Ser: 0.79 mg/dL (ref 0.50–1.35)
GFR calc non Af Amer: >90 mL/min (ref >90)
Glucose, Bld: 100 mg/dL — ABNORMAL HIGH (ref 70–99)
POTASSIUM: 4 meq/L (ref 3.7–5.3)
Sodium: 137 mEq/L (ref 137–147)

## 2013-09-14 LAB — CBC
HEMATOCRIT: 32.3 % — AB (ref 39.0–52.0)
Hemoglobin: 11.2 g/dL — ABNORMAL LOW (ref 13.0–17.0)
MCH: 29.3 pg (ref 26.0–34.0)
MCHC: 34.7 g/dL (ref 30.0–36.0)
MCV: 84.6 fL (ref 78.0–100.0)
Platelets: 121 10*3/uL — ABNORMAL LOW (ref 150–400)
RBC: 3.82 MIL/uL — ABNORMAL LOW (ref 4.22–5.81)
RDW: 14.3 % (ref 11.5–15.5)
WBC: 2.1 10*3/uL — AB (ref 4.0–10.5)

## 2013-09-14 MED ORDER — ADALIMUMAB 40 MG/0.8ML ~~LOC~~ KIT: 40.0000 mg | PACK | SUBCUTANEOUS | Status: DC

## 2013-09-14 MED ORDER — OXYCODONE HCL 5 MG PO TABS: 5.0000 mg | ORAL_TABLET | Freq: Four times a day (QID) | ORAL | Status: DC | PRN

## 2013-09-14 MED ORDER — AMOXICILLIN-POT CLAVULANATE 875-125 MG PO TABS: 1.0000 | ORAL_TABLET | Freq: Two times a day (BID) | ORAL | Status: DC

## 2013-09-14 NOTE — Discharge Instructions (Signed)
[facility]      [facility] [phone]  OPEN ABDOMINAL SURGERY: POST OP INSTRUCTIONS  Always review your discharge instruction sheet given to you by the facility where your surgery was performed.  IF YOU HAVE DISABILITY OR FAMILY LEAVE FORMS, YOU MUST BRING THEM TO THE OFFICE FOR PROCESSING.  PLEASE DO NOT GIVE THEM TO YOUR DOCTOR.  1. A prescription for pain medication may be given to you upon discharge.  Take your pain medication as prescribed, if needed.  If narcotic pain medicine is not needed, then you may take acetaminophen (Tylenol) or ibuprofen (Advil) as needed. 2. Take your usually prescribed medications unless otherwise directed. 3. If you need a refill on your pain medication, please contact your pharmacy. They will contact our office to request authorization.  Prescriptions will not be filled after 5pm or on week-ends. 4. You should follow a light diet the first few days after arrival home, such as soup and crackers, pudding, etc.unless your doctor has advised otherwise. A high-fiber, low fat diet can be resumed as tolerated.   Be sure to include lots of fluids daily. Most patients will experience some swelling and bruising on the chest and neck area.  Ice packs will help.  Swelling and bruising can take several days to resolve 5. Most patients will experience some swelling and bruising in the area of the incision. Ice pack will help. Swelling and bruising can take several days to resolve..  6. It is common to experience some constipation if taking pain medication after surgery.  Increasing fluid intake and taking a stool softener will usually help or prevent this problem from occurring.  A mild laxative (Milk of Magnesia or Miralax) should be taken according to package directions if there are no bowel movements after 48 hours. 7.  You may have steri-strips (small skin tapes) in place directly over the incision.  These strips should be left on the skin for 7-10 days.  If your  surgeon used skin glue on the incision, you may shower in 24 hours.  The glue will flake off over the next 2-3 weeks.  Any sutures or staples will be removed at the office during your follow-up visit. You may find that a light gauze bandage over your incision may keep your staples from being rubbed or pulled. You may shower and replace the bandage daily. 8. ACTIVITIES:  You may resume regular (light) daily activities beginning the next day--such as daily self-care, walking, climbing stairs--gradually increasing activities as tolerated.  You may have sexual intercourse when it is comfortable.  Refrain from any heavy lifting or straining until approved by your doctor. a. You may drive when you no longer are taking prescription pain medication, you can comfortably wear a seatbelt, and you can safely maneuver your car and apply brakes b. Return to Work: ___________________________________ 9. You should see your doctor in the office for a follow-up appointment approximately two weeks after your surgery.  Make sure that you call for this appointment within a day or two after you arrive home to insure a convenient appointment time. OTHER INSTRUCTIONS:  _____________________________________________________________ _____________________________________________________________  WHEN TO CALL YOUR DOCTOR: 1. Fever over 101.0 2. Inability to urinate 3. Nausea and/or vomiting 4. Extreme swelling or bruising 5. Continued bleeding from incision. 6. Increased pain, redness, or drainage from the incision. 7. Difficulty swallowing or breathing 8. Muscle cramping or spasms. 9. Numbness or tingling in hands or feet or around lips.  The clinic staff is available to   answer your questions during regular business hours.  Please don't hesitate to call and ask to speak to one of the nurses if you have concerns.  For further questions, please visit [email or web address]   

## 2013-09-14 NOTE — Discharge Summary (Signed)
Agree with the discharge summary as outlined. I agree with the followup plans for outpatient care.  George Marshall. Dalbert Batman, M.D., [facility] General and Minimally invasive Surgery Breast and Colorectal Surgery Office:   [phone] Pager:   [phone]

## 2013-09-14 NOTE — Progress Notes (Addendum)
Patient is alert and oriented and vital signs are stable. Patient's incision looks good and shows no signs of infection. Patient ambulated independently in hall way around nursing station and tolerated well. Patient tolerated regular diet with no complains of nausea or vomiting. Patient had a bowel movement and flatus this morning. Patient complains of minimal pain which is tolerable. Patient's IV has been taken out and patient tolerated well with no bleeding.  Marlinda Mike (student Nurse) 09/14/13. 12:36pm

## 2013-09-14 NOTE — Progress Notes (Signed)
I have interviewed and examined this patient this morning. I agree with the assessment it up as outlined by Ms. Boor, PA.  He is tolerating a regular diet, is angulating independently, is having spontaneous bowel movements, and is asking to go home. His abdomen is soft. All the incisions are healing without signs of infection.  Will discharge home today. Followup within one week for staple removal, Dr. Marlou St[place] Agree with seven-day course of antibiotics, total, agree with Augmentin I had a long discussion with him about diet and activities, wound care(shower), and driving(another week).   Edsel Petrin. Dalbert Batman, M.D., [facility] General and Minimally invasive Surgery Breast and Colorectal Surgery Office:   [phone] Pager:   [phone]

## 2013-09-14 NOTE — Discharge Summary (Signed)
Physician Discharge Summary  Patient ID: George Marshall MRN: [id number] DOB/AGE: 61-26-1952 61 y.o.  Admit date: 09/07/2013 Discharge date: 09/14/2013  Admitting Diagnosis: Cholecystitis Cholelithiasis Choledocholithiasis  Discharge Diagnosis Patient Active Problem List   Diagnosis Date Noted  . Choledocholithiasis with acute cholangitis s/p ERCP 09/08/2013 09/12/2013  . Acute cholecystitis s/p lap/open cholecystectomy 09/10/2013 09/07/2013  . Viral URI 08/10/2013  . Rheumatoid arthritis(714.0) 12/27/2010  . Anemia 12/27/2010  . Hyperglycemia 12/27/2010  . COPD (chronic obstructive pulmonary disease) 11/21/2010  . Neutropenia 11/08/2010  . B12 deficiency 11/08/2010  . Iron deficiency 11/08/2010  . Sinusitis 09/26/2010  . GERD 05/29/2007  . HYPERLIPIDEMIA 05/23/2007    Consultants Dr.Salem Ganem  Imaging: No results found.  Procedures Dr. Marlou St[place] (09/10/13) - Laparoscopic Cholecystectomy converted to open cholecystectomy with IOC Dr. Paulita Fujita (09/08/13) - ERCP with sphincterotomy/papillotomy, ERCP with bile duct stone extraction   Hospital Course:  63 y/o male who has had abdominal pain below the sternum for 2-3 months, it comes and goes, not really related to food, more to position, lying down and sitting watching TV. It would last about 10 min and then resolve. He had a really bad episode on 08/24/13 and was seen in the ER at [facility]. He was treated for GERD and symptoms improved in the ER there, but returned after going home. He was better by the following week. His symptoms resolved and he was doing well till last PM he had acute onset of symptoms after dinner, pain was again mid epigastric, but went to his back. He later developed chills and sweats, he did not take a temperature at that point. He then developed nausea and vomiting, the pain persisted and he went back to the ER at [facility]. Work up at that facility shows slight elevation of the AST 130, and ALT of 55, and Tbil of 4.8, on  3/9 the bilirubin was 1.5.  He was transferred to [facility].  Workup showed evidence of cholecystitis with cholelithiasis and possible choledocholelithiasis.  Patient was admitted and underwent procedure listed above.  He had an attempted laparoscopic converted to open cholecystectomy.  Tolerated procedure well and was transferred to the floor.  He experienced a post-operative ileus which resolved quickly.  Diet was advanced as tolerated.  On POD #4, the patient was voiding well, tolerating diet, ambulating well, pain well controlled, vital signs stable, incisions c/d/i and felt stable for discharge home.  Patient will follow up in our office for staple removal next week and in 2-3 weeks for post-op check and knows to call with questions or concerns.  We recommend he finish 7 days of antibiotics (Augmentin) and hold his humira for 1 additional week.        Medication List         adalimumab 40 MG/0.8ML injection  Commonly known as:  HUMIRA PEN  Inject 0.8 mLs (40 mg total) into the skin every 14 (fourteen) days.     amoxicillin-clavulanate 875-125 MG per tablet  Commonly known as:  AUGMENTIN  Take 1 tablet by mouth 2 (two) times daily.     omeprazole 20 MG capsule  Commonly known as:  PRILOSEC  Take 1 capsule (20 mg total) by mouth daily.     oxyCODONE 5 MG immediate release tablet  Commonly known as:  Oxy IR/ROXICODONE  Take 1-2 tablets (5-10 mg total) by mouth every 6 (six) hours as needed for moderate pain.     simethicone 80 MG chewable tablet  Commonly known as:  MYLICON  Chew 80 mg by mouth every 6 (six) hours as needed (gas pain).         Follow-up Information   Follow up with Merrie Roof, MD In 2 weeks. (For post-operation check in 2-3 weeks)    Specialty:  General Surgery   Contact information:   [place] [address] [phone]       Follow up with [facility],MD, MD. (With Dr. Ethlyn Gallery nurse for staple removal between April 8-10th for staple  removal)    Specialty:  General Surgery      Signed: Excell Seltzer [facility] [phone]  09/14/2013, 12:16 PM

## 2013-09-14 NOTE — Progress Notes (Signed)
4 Days Post-Op  Subjective: Pt doing great.  Pain minimal.  No N/V.  Hungry, tolerating full liquids.  Ambulating well.  Wonders if he should post-pone his Humira injections which he gets for RA.  He is due on Wednesday for that injection.    Objective: Vital signs in last 24 hours: Temp:  [97.5 F (36.4 C)-98.2 F (36.8 C)] 98.2 F (36.8 C) (03/30 0524) Pulse Rate:  [74-86] 74 (03/30 0524) Resp:  [18] 18 (03/30 0524) BP: (101-115)/(62-69) 107/62 mmHg (03/30 0524) SpO2:  [96 %-97 %] 96 % (03/30 0524) Last BM Date: 09/06/13  Intake/Output from previous day: 03/29 0701 - 03/30 0700 In: 2168.8 [P.O.:240; I.V.:1928.8] Out: 2750 [Urine:2750] Intake/Output this shift:    PE: Gen:  Alert, NAD, pleasant Abd: Soft, NT/ND, +BS, no HSM, Large open  incision in RUQ and laparoscopic incisions C/D/I   Lab Results:   Recent Labs  09/12/13 0458  WBC 2.8*  HGB 10.3*  HCT 30.4*  PLT 118*   BMET  Recent Labs  09/12/13 0458  NA 136*  K 3.9  CL 99  CO2 24  GLUCOSE 80  BUN 9  CREATININE 0.82  CALCIUM 7.9*   PT/INR No results found for this basename: LABPROT, INR,  in the last 72 hours CMP     Component Value Date/Time   NA 136* 09/12/2013 0458   K 3.9 09/12/2013 0458   CL 99 09/12/2013 0458   CO2 24 09/12/2013 0458   GLUCOSE 80 09/12/2013 0458   GLUCOSE 90 05/17/2006 0822   BUN 9 09/12/2013 0458   CREATININE 0.82 09/12/2013 0458   CREATININE 0.87 03/08/2012 1332   CALCIUM 7.9* 09/12/2013 0458   PROT 5.2* 09/12/2013 0458   ALBUMIN 2.4* 09/12/2013 0458   AST 54* 09/12/2013 0458   ALT 62* 09/12/2013 0458   ALKPHOS 131* 09/12/2013 0458   BILITOT 3.0* 09/12/2013 0458   GFRNONAA >90 09/12/2013 0458   GFRAA >90 09/12/2013 0458   Lipase     Component Value Date/Time   LIPASE 26 09/10/2013 0454       Studies/Results: No results found.  Anti-infectives: Anti-infectives   Start     Dose/Rate Route Frequency Ordered Stop   09/08/13 0845  Ampicillin-Sulbactam (UNASYN) 3 g in  sodium chloride 0.9 % 100 mL IVPB  Status:  Discontinued     3 g 100 mL/hr over 60 Minutes Intravenous Every 6 hours 09/08/13 0843 09/08/13 1947   09/08/13 0815  ciprofloxacin (CIPRO) IVPB 400 mg  Status:  Discontinued     400 mg 200 mL/hr over 60 Minutes Intravenous  Once 09/08/13 0806 09/08/13 0806   09/07/13 0900  Ampicillin-Sulbactam (UNASYN) 3 g in sodium chloride 0.9 % 100 mL IVPB     3 g 100 mL/hr over 60 Minutes Intravenous Every 6 hours 09/07/13 0815 09/17/13 0859       Assessment/Plan 1. Cholecystitis, cholelithiasis, choledocholithiasis  -ERCP with sphincterotomy/papillotomy, ERCP with bile duct stone extraction. 09/08/13, DR. Arta Silence  -POD #4 s/p laparoscopic cholecystectomy converted to open with negative IOC 2. Rheumatoid Arthritis on Humira  3. COPD/hx of tobacco use.  4. GERD  5. Hx of B12 deficiency, not currently on treatment.   Plan: 1.  May be able to d/c today if tolerates solid food 2.  Ambulate and IS 3.  Supposed to get Humira for RA - will delay it for 1 week 4.  F/u with Dr. Marlou [place] in 2-3 weeks for PO check and POD 10-14 for staple removal  5.  Would continue antibiotics 7 days after surgery.  Would send home on Augmentin. For 3 more days.    LOS: 7 days    George Marshall 09/14/2013, 8:43 AM Pager: [phone]

## 2013-09-23 ENCOUNTER — Ambulatory Visit (INDEPENDENT_AMBULATORY_CARE_PROVIDER_SITE_OTHER): Payer: BC Managed Care – PPO

## 2013-09-23 DIAGNOSIS — Z4802 Encounter for removal of sutures: Secondary | ICD-10-CM

## 2013-09-23 NOTE — Progress Notes (Signed)
Patient comes in 13 days s/p open cholecystectomy by Dr Marlou Starks. Incision is healed nicely. All staples were removed without difficulty. Steri strips applied. Patient will follow up next week with Dr Marlou Starks.

## 2013-09-28 ENCOUNTER — Encounter (INDEPENDENT_AMBULATORY_CARE_PROVIDER_SITE_OTHER): Payer: Self-pay | Admitting: General Surgery

## 2013-09-28 ENCOUNTER — Ambulatory Visit (INDEPENDENT_AMBULATORY_CARE_PROVIDER_SITE_OTHER): Payer: BC Managed Care – PPO | Admitting: General Surgery

## 2013-09-28 VITALS — BP 126/80 | HR 111 | Temp 97.2°F | Resp 16 | Ht 71.0 in | Wt 142.0 lb

## 2013-09-28 DIAGNOSIS — K8001 Calculus of gallbladder with acute cholecystitis with obstruction: Secondary | ICD-10-CM

## 2013-09-28 NOTE — Patient Instructions (Signed)
No heavy lifting 

## 2013-09-28 NOTE — Progress Notes (Signed)
Subjective:     Patient ID: George Marshall, male   DOB: Jun 24, 1950, 63 y.o.   MRN: [id number]  HPI The patient is a 63 year old white male who is about 3 weeks status post open cholecystectomy for cholecystitis with cholelithiasis. He has tolerated the surgery well. He denies any abdominal pain. His appetite is good and his bowels are working normally.  Review of Systems     Objective:   Physical Exam On exam his abdomen is soft and nontender. His right subcostal incision is healing nicely with no sign of infection or seroma    Assessment:     The patient is 3 weeks status post open cholecystectomy     Plan:     At this point I would like him to refrain from any heavy lifting for another 3 weeks. I will plan to see him back in one month to check his progress.

## 2013-10-14 ENCOUNTER — Encounter: Payer: Self-pay | Admitting: Family Medicine

## 2013-10-14 ENCOUNTER — Ambulatory Visit (INDEPENDENT_AMBULATORY_CARE_PROVIDER_SITE_OTHER): Payer: BC Managed Care – PPO | Admitting: Family Medicine

## 2013-10-14 VITALS — BP 116/78 | HR 104 | Temp 98.6°F | Resp 16 | Ht 71.0 in | Wt 139.0 lb

## 2013-10-14 DIAGNOSIS — R5383 Other fatigue: Principal | ICD-10-CM

## 2013-10-14 DIAGNOSIS — R630 Anorexia: Secondary | ICD-10-CM

## 2013-10-14 DIAGNOSIS — M069 Rheumatoid arthritis, unspecified: Secondary | ICD-10-CM

## 2013-10-14 DIAGNOSIS — R5381 Other malaise: Secondary | ICD-10-CM

## 2013-10-14 LAB — CBC WITH DIFFERENTIAL/PLATELET
BASOS PCT: 0.7 % (ref 0.0–3.0)
Basophils Absolute: 0 10*3/uL (ref 0.0–0.1)
EOS ABS: 0 10*3/uL (ref 0.0–0.7)
Eosinophils Relative: 0.4 % (ref 0.0–5.0)
HCT: 41.2 % (ref 39.0–52.0)
HEMOGLOBIN: 14 g/dL (ref 13.0–17.0)
Lymphocytes Relative: 65.9 % — ABNORMAL HIGH (ref 12.0–46.0)
Lymphs Abs: 2 10*3/uL (ref 0.7–4.0)
MCHC: 34 g/dL (ref 30.0–36.0)
MCV: 84.7 fl (ref 78.0–100.0)
MONO ABS: 0.6 10*3/uL (ref 0.1–1.0)
Monocytes Relative: 18.5 % — ABNORMAL HIGH (ref 3.0–12.0)
NEUTROS ABS: 0.4 10*3/uL — AB (ref 1.4–7.7)
Neutrophils Relative %: 14.5 % — ABNORMAL LOW (ref 43.0–77.0)
Platelets: 147 10*3/uL — ABNORMAL LOW (ref 150.0–400.0)
RBC: 4.86 Mil/uL (ref 4.22–5.81)
RDW: 15.8 % — ABNORMAL HIGH (ref 11.5–14.6)
WBC: 3.1 10*3/uL — ABNORMAL LOW (ref 4.5–10.5)

## 2013-10-14 LAB — COMPREHENSIVE METABOLIC PANEL
ALK PHOS: 116 U/L (ref 39–117)
ALT: 21 U/L (ref 0–53)
AST: 21 U/L (ref 0–37)
Albumin: 3.4 g/dL — ABNORMAL LOW (ref 3.5–5.2)
BUN: 12 mg/dL (ref 6–23)
CO2: 28 mEq/L (ref 19–32)
CREATININE: 0.7 mg/dL (ref 0.4–1.5)
Calcium: 8.4 mg/dL (ref 8.4–10.5)
Chloride: 101 mEq/L (ref 96–112)
GFR: 122.04 mL/min (ref >60.00)
Glucose, Bld: 76 mg/dL (ref 70–99)
Potassium: 3.6 mEq/L (ref 3.5–5.1)
Sodium: 137 mEq/L (ref 135–145)
Total Bilirubin: 3.1 mg/dL — ABNORMAL HIGH (ref 0.3–1.2)
Total Protein: 6.5 g/dL (ref 6.0–8.3)

## 2013-10-14 LAB — LIPASE: Lipase: 28 U/L (ref 11.0–59.0)

## 2013-10-14 MED ORDER — OXYCODONE HCL 5 MG PO TABS: ORAL_TABLET | ORAL | Status: DC

## 2013-10-14 NOTE — Progress Notes (Signed)
Pre visit review using our clinic review tool, if applicable. No additional management support is needed unless otherwise documented below in the visit note. 

## 2013-10-14 NOTE — Assessment & Plan Note (Signed)
Etiology unclear. Exam normal. I think he could be feeling mild narcotic withdrawal. Will check CBC w/diff, CMET, and lipase. Gave rx for 5mg  oxycodone: 1 tab bid x 7d, then 1 tab qhs x7d, then 1 tab every other night x 5 doses. F/u 10d.

## 2013-10-14 NOTE — Progress Notes (Signed)
OFFICE NOTE  10/14/2013  CC:  Chief Complaint  Patient presents with  . Fatigue  . Chills  . Post-op Problem     HPI: Patient is a 63 y.o. Caucasian male who is here for fatigue/chills.   Onset 4 days ago of feeling fatigued, some chills/some hot flashes but no feeling of fever per pt/no temp checked, appetite down last 2d, trying to drink liquids well still. Constipation has been an issue but good BM today helped him feel better.  Taking senna S. Was on multi dose narcotic for pain for at least 1 mo and then he partially weened off these and then stopped them b/c he didn't have anymore pain--this was about 5d/a.  He didn't feel well when completely off this med (fatigued and insomnia) so he restarted oxycodone hs and that has helped sleep now. No significant URI sx's or cough or SOB.  No CP.  No HAs, no muscle aches, no joint aches.  No rash. No urinary urgency or frequency, no dysuria.  Abd wound healing well-no pain.  Denies depression. No known sick contacts recently.  No recent travel.  He has been resting since hospitalization/surgery for choledocholithiasis/cholecystitis about 1 mo ago.  Pertinent PMH:  Past Medical History  Diagnosis Date  . Hyperlipidemia   . GERD (gastroesophageal reflux disease)   . History of syncope 12/2004  . Neutropenia 11/08/2010  . B12 deficiency 11/08/2010  . Iron deficiency 11/08/2010  . Rheumatoid arthritis(714.0) 10/2010  . COPD (chronic obstructive pulmonary disease)     Dr. Elsworth Soho  . Fracture of great toe, right, closed 09/2012    Nondisplaced.  Buddy taped, post-op shoe, ortho eval was done s/p initial dx in urgent care center.  . Pneumonia 10/20/2010    Initial dx 10/17/10--CXR and CT scan. F/u CT 11/06/10 showed resolving RUL and RML pneumonia and resolving reactive mediastinal LAD.    Marland Kitchen Fracture of fifth toe, right, closed 09/26/2012  . Cerumen impaction 08/10/2013   Past Surgical History  Procedure Laterality Date  . Inguinal hernia repair       age 58  . Ercp N/A 09/08/2013    Procedure: ENDOSCOPIC RETROGRADE CHOLANGIOPANCREATOGRAPHY (ERCP);  Surgeon: Arta Silence, MD;  Location: [facility] ENDOSCOPY;  Service: Endoscopy;  Laterality: N/A;  . Cholecystectomy N/A 09/10/2013    Procedure: LAPAROSCOPIC CHOLECYSTECTOMY, OPEN CHOLECYSTECTOMY WITH INTRAOPERATIVE CHOLANGIOGRAM;  Surgeon: Merrie Roof, MD;  Location: [facility] ORS;  Service: General;  Laterality: N/A;    MEDS:  Outpatient Prescriptions Prior to Visit  Medication Sig Dispense Refill  . adalimumab (HUMIRA PEN) 40 MG/0.8ML injection Inject 0.8 mLs (40 mg total) into the skin every 14 (fourteen) days.    0  . oxyCODONE (OXY IR/ROXICODONE) 5 MG immediate release tablet Take 1-2 tablets (5-10 mg total) by mouth every 6 (six) hours as needed for moderate pain.  40 tablet  0   No facility-administered medications prior to visit.    PE: Blood pressure 116/78, pulse 104, temperature 98.6 F (37 C), temperature source Temporal, resp. rate 16, height 5\' 11"  (1.803 m), weight 139 lb (63.05 kg), SpO2 100.00%. Gen: Alert, well appearing.  Patient is oriented to person, place, time, and situation. AFFECT: pleasant, lucid thought and speech. ENT: Ears: EACs clear, normal epithelium.  TMs with good light reflex and landmarks bilaterally.  Eyes: no injection, icteris, swelling, or exudate.  EOMI, PERRLA. Nose: no drainage or turbinate edema/swelling.  No injection or focal lesion.  Mouth: lips without lesion/swelling.  Oral mucosa pink and  moist.  Dentition intact and without obvious caries or gingival swelling.  Oropharynx without erythema, exudate, or swelling.  Neck: supple/nontender.  No LAD, mass, or TM.  CV: RRR, no m/r/g.   LUNGS: CTA bilat, nonlabored resps, good aeration in all lung fields. ABD: soft, NT, ND, BS normal.  No hepatospenomegaly or mass.  No bruits.  Well healed RUQ surgical wound. EXT: no clubbing, cyanosis, or edema.  Musculoskeletal: no joint swelling, erythema, warmth, or  tenderness.  ROM of all joints intact. Skin - no sores or suspicious lesions or rashes or color changes  LABS: none today Recent labs: Lab Results  Component Value Date   WBC 2.1* 09/14/2013   HGB 11.2* 09/14/2013   HCT 32.3* 09/14/2013   MCV 84.6 09/14/2013   PLT 121* 09/14/2013     Chemistry      Component Value Date/Time   NA 137 09/14/2013 0933   K 4.0 09/14/2013 0933   CL 100 09/14/2013 0933   CO2 27 09/14/2013 0933   BUN 5* 09/14/2013 0933   CREATININE 0.79 09/14/2013 0933   CREATININE 0.87 03/08/2012 1332      Component Value Date/Time   CALCIUM 8.2* 09/14/2013 0933   ALKPHOS 131* 09/12/2013 0458   AST 54* 09/12/2013 0458   ALT 62* 09/12/2013 0458   BILITOT 3.0* 09/12/2013 0458     Lab Results  Component Value Date   LIPASE 26 09/10/2013    IMPRESSION AND PLAN:  Other malaise and fatigue Etiology unclear. Exam normal. I think he could be feeling mild narcotic withdrawal. Will check CBC w/diff, CMET, and lipase. Gave rx for 5mg  oxycodone: 1 tab bid x 7d, then 1 tab qhs x7d, then 1 tab every other night x 5 doses. F/u 10d.   An After Visit Summary was printed and given to the patient.

## 2013-10-23 ENCOUNTER — Encounter: Payer: Self-pay | Admitting: Family Medicine

## 2013-10-23 ENCOUNTER — Ambulatory Visit (INDEPENDENT_AMBULATORY_CARE_PROVIDER_SITE_OTHER): Payer: BC Managed Care – PPO | Admitting: Family Medicine

## 2013-10-23 VITALS — BP 112/70 | HR 91 | Temp 98.5°F | Resp 18 | Ht 71.0 in | Wt 139.0 lb

## 2013-10-23 DIAGNOSIS — F19239 Other psychoactive substance dependence with withdrawal, unspecified: Secondary | ICD-10-CM

## 2013-10-23 DIAGNOSIS — R5383 Other fatigue: Principal | ICD-10-CM

## 2013-10-23 DIAGNOSIS — F1123 Opioid dependence with withdrawal: Secondary | ICD-10-CM

## 2013-10-23 DIAGNOSIS — F1120 Opioid dependence, uncomplicated: Secondary | ICD-10-CM

## 2013-10-23 DIAGNOSIS — F19939 Other psychoactive substance use, unspecified with withdrawal, unspecified: Secondary | ICD-10-CM

## 2013-10-23 DIAGNOSIS — F1193 Opioid use, unspecified with withdrawal: Secondary | ICD-10-CM

## 2013-10-23 DIAGNOSIS — R5381 Other malaise: Secondary | ICD-10-CM

## 2013-10-23 NOTE — Progress Notes (Signed)
OFFICE NOTE  10/23/2013  CC:  Chief Complaint  Patient presents with  . Follow-up     HPI: Patient is a 63 y.o. Caucasian male who is here for 10d f/u for unexplained fatigue/malaise. Doing great now.  Continues the prev discussed ween from his oxycodone. No fevers, no fatigue, no n/v/d, no rash, no pain in joints or muscles or abd. Appetite good.   Pertinent PMH:  Past medical, surgical, social hx reviewed.  MEDS:  Outpatient Prescriptions Prior to Visit  Medication Sig Dispense Refill  . adalimumab (HUMIRA PEN) 40 MG/0.8ML injection Inject 0.8 mLs (40 mg total) into the skin every 14 (fourteen) days.    0  . oxyCODONE (OXY IR/ROXICODONE) 5 MG immediate release tablet 1 tab twice daily x 7d, then 1 tab qhs x 7d, then 1 tab every other night x 5 doses, then stop  26 tablet  0   No facility-administered medications prior to visit.    PE: Blood pressure 112/70, pulse 91, temperature 98.5 F (36.9 C), temperature source Temporal, resp. rate 18, height 5\' 11"  (1.803 m), weight 139 lb (63.05 kg), SpO2 100.00%. Gen: Alert, well appearing.  Patient is oriented to person, place, time, and situation. No pallor or jaundice. CV: RRR, no m/r/g.   LUNGS: CTA bilat, nonlabored resps, good aeration in all lung fields.  LAB:  Lab Results  Component Value Date   WBC 3.1* 10/14/2013   HGB 14.0 10/14/2013   HCT 41.2 10/14/2013   MCV 84.7 10/14/2013   PLT 147.0* 10/14/2013     Chemistry      Component Value Date/Time   NA 137 10/14/2013 1456   K 3.6 10/14/2013 1456   CL 101 10/14/2013 1456   CO2 28 10/14/2013 1456   BUN 12 10/14/2013 1456   CREATININE 0.7 10/14/2013 1456   CREATININE 0.87 03/08/2012 1332      Component Value Date/Time   CALCIUM 8.4 10/14/2013 1456   ALKPHOS 116 10/14/2013 1456   AST 21 10/14/2013 1456   ALT 21 10/14/2013 1456   BILITOT 3.1* 10/14/2013 1456     Lab Results  Component Value Date   LIPASE 28.0 10/14/2013    IMPRESSION AND PLAN:  Fatigue,  temporary--resolved. May have been related to nonspecific viral illness but also could have been due to mild narcotic withdrawal. He'll continue the oxycodone taper we have already established for him and if sx's return when he finishes this, will do taper with vicodin to further ease his ween off of narcotics.  FOLLOW UP: prn

## 2013-10-23 NOTE — Progress Notes (Signed)
Pre visit review using our clinic review tool, if applicable. No additional management support is needed unless otherwise documented below in the visit note. 

## 2013-10-28 ENCOUNTER — Ambulatory Visit (INDEPENDENT_AMBULATORY_CARE_PROVIDER_SITE_OTHER): Payer: BC Managed Care – PPO | Admitting: General Surgery

## 2013-10-28 ENCOUNTER — Encounter (INDEPENDENT_AMBULATORY_CARE_PROVIDER_SITE_OTHER): Payer: Self-pay | Admitting: General Surgery

## 2013-10-28 DIAGNOSIS — K8001 Calculus of gallbladder with acute cholecystitis with obstruction: Secondary | ICD-10-CM

## 2013-10-28 NOTE — Patient Instructions (Signed)
May return to normal activities 

## 2013-10-28 NOTE — Progress Notes (Signed)
Subjective:     Patient ID: George Marshall, male   DOB: [birth year], 63 y.o.   MRN: [id number]  HPI The patient is a 63 year old male who is about 6 weeks status post open cholecystectomy for cholecystitis with cholelithiasis. He is doing well and has no complaints today. His appetite is good and his bowels are working normally appear  Review of Systems     Objective:   Physical Exam On exam his abdomen is soft and nontender. His incision is healing nicely with no sign of infection    Assessment:     The patient is 6 weeks status post open cholecystectomy     Plan:     At this point he may return to his normal activities without restriction. I will plan to see him back on a when necessary basis

## 2013-12-10 ENCOUNTER — Encounter: Payer: Self-pay | Admitting: Pulmonary Disease

## 2013-12-10 ENCOUNTER — Ambulatory Visit (INDEPENDENT_AMBULATORY_CARE_PROVIDER_SITE_OTHER): Payer: BC Managed Care – PPO | Admitting: Pulmonary Disease

## 2013-12-10 VITALS — BP 124/66 | HR 99 | Ht 71.0 in | Wt 143.0 lb

## 2013-12-10 DIAGNOSIS — J449 Chronic obstructive pulmonary disease, unspecified: Secondary | ICD-10-CM

## 2013-12-10 NOTE — Assessment & Plan Note (Signed)
COPD appears stable We discussed early signs of bronchitis Spirometry next visit when due for physical Tobacco cessation advised

## 2013-12-10 NOTE — Patient Instructions (Signed)
COPD appears stable We discussed early signs of bronchitis Breathing test next visit

## 2013-12-10 NOTE — Progress Notes (Signed)
   Subjective:    Patient ID: George Marshall, male    DOB: 1950/07/26, 63 y.o.   MRN: [id number]  HPI PCP -  [place]    62/M, ex smoker , quit '02 for FU of Gold B COPD.  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 RUL segmental consolidation & RLL patchy infiltrate.   2012 -Spirometry  moderate airway obstruction - FEv1 56%  07/2012 FEv1 53%  05/12/2013  FEv1 slight drop from 2.02 to 1.94- 51%     12/10/2013  Chief Complaint  Patient presents with  . Follow-up    Pt has no breathing complaints at this time. CAT score 3.   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 Also denies presyncope, palpitations, heartburn, abdominal pain, nausea, vomiting, diarrhea or change in bowel or urinary habits, dysuria,hematuria, rash, arthralgias, visual complaints, headache, numbness weakness or ataxia.     Objective:   Physical Exam  Gen. Pleasant, well-nourished, in no distress ENT - no lesions, no post nasal drip Neck: No JVD, no thyromegaly, no carotid bruits Lungs: no use of accessory muscles, no dullness to percussion, clear without rales or rhonchi  Cardiovascular: Rhythm regular, heart  sounds  normal, no murmurs or gallops, no peripheral edema Musculoskeletal: No deformities, no cyanosis or clubbing        Assessment & Plan:

## 2014-04-16 ENCOUNTER — Telehealth: Payer: Self-pay | Admitting: *Deleted

## 2014-04-16 NOTE — Telephone Encounter (Signed)
Received request to call pt and schedule for genetics.  Called and confirmed 04/28/14 appt w/ pt.

## 2014-04-28 ENCOUNTER — Ambulatory Visit (HOSPITAL_BASED_OUTPATIENT_CLINIC_OR_DEPARTMENT_OTHER): Payer: BC Managed Care – PPO | Admitting: Genetic Counselor

## 2014-04-28 ENCOUNTER — Other Ambulatory Visit: Payer: BC Managed Care – PPO

## 2014-04-28 ENCOUNTER — Encounter: Payer: Self-pay | Admitting: Genetic Counselor

## 2014-04-28 DIAGNOSIS — Z803 Family history of malignant neoplasm of breast: Secondary | ICD-10-CM

## 2014-04-28 DIAGNOSIS — Z8042 Family history of malignant neoplasm of prostate: Secondary | ICD-10-CM

## 2014-04-28 DIAGNOSIS — Z315 Encounter for genetic counseling: Secondary | ICD-10-CM

## 2014-04-28 DIAGNOSIS — Z8041 Family history of malignant neoplasm of ovary: Secondary | ICD-10-CM

## 2014-04-28 NOTE — Progress Notes (Signed)
Dr.  Anitra Lauth, George Blackwater, MD requested a consultation for genetic counseling and risk assessment for George Marshall, a 63 y.o. male, for discussion of his family history of breast, ovarian and prostate cancer.  He presents to clinic today to discuss the possibility of a genetic predisposition to cancer, and to further clarify his risks, as well as his family members' risks for cancer.   HISTORY OF PRESENT ILLNESS: George Marshall is a 63 y.o. male with no personal history of cancer.  His mother, George Marshall, had been seen for genetic counseling and testing based on her personal history of breast and ovarian cancer. She had the breast/ovarian cancer panel from [facility] performed in April 2015 and was found to have a variant of unknown significance in [facility], but was otherwise negative for mutations in the panel, including BRCA1 and BRCA2.  Past Medical History  Diagnosis Date  . Hyperlipidemia   . GERD (gastroesophageal reflux disease)   . History of syncope 12/2004  . Neutropenia 11/08/2010  . B12 deficiency 11/08/2010  . Iron deficiency 11/08/2010  . Rheumatoid arthritis(714.0) 10/2010  . COPD (chronic obstructive pulmonary disease)     Dr. Elsworth Soho  . Fracture of great toe, right, closed 09/2012    Nondisplaced.  Buddy taped, post-op shoe, ortho eval was done s/p initial dx in urgent care center.  . Pneumonia 10/20/2010    Initial dx 10/17/10--CXR and CT scan. F/u CT 11/06/10 showed resolving RUL and RML pneumonia and resolving reactive mediastinal LAD.    Marland Kitchen Fracture of fifth toe, right, closed 09/26/2012  . Cerumen impaction 08/10/2013    Past Surgical History  Procedure Laterality Date  . Inguinal hernia repair      age 80  . Ercp N/A 09/08/2013    Procedure: ENDOSCOPIC RETROGRADE CHOLANGIOPANCREATOGRAPHY (ERCP);  Surgeon: Arta Silence, MD;  Location: [facility] ENDOSCOPY;  Service: Endoscopy;  Laterality: N/A;  . Cholecystectomy N/A 09/10/2013    Procedure: LAPAROSCOPIC CHOLECYSTECTOMY, OPEN  CHOLECYSTECTOMY WITH INTRAOPERATIVE CHOLANGIOGRAM;  Surgeon: Merrie Roof, MD;  Location: [facility] ORS;  Service: General;  Laterality: N/A;    History   Social History  . Marital Status: Divorced    Spouse Name: N/A    Number of Children: N/A  . Years of Education: N/A   Social History Main Topics  . Smoking status: Former Smoker -- 1.50 packs/day for 30 years    Types: Cigarettes    Quit date: 06/18/2000  . Smokeless tobacco: Current User    Types: Chew  . Alcohol Use: No  . Drug Use: No  . Sexual Activity: None   Other Topics Concern  . None   Social History Narrative   Divorced, 2 step children.   Has live-in girlfriend.   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   [place] native, orig from [place], [place].   Tob: 30 pack-yr hx.  Quit 2000.   No alcohol.  No drugs.   Volunteer with [employer].   Gardening/yard work.    FAMILY HISTORY:  We obtained a detailed, 4-generation family history.  Significant diagnoses are listed below: Family History  Problem Relation Age of Onset  . Hypertension      parents late 49's  . Breast cancer Mother 46  . Ovarian cancer Mother 40  . Skin cancer Mother   . Breast cancer Maternal Aunt     dx in her 13s  . Breast cancer Maternal Aunt 70  . Breast cancer Maternal Aunt  56  . Breast cancer Paternal Aunt   . Prostate cancer Cousin     double first cousin  The patient is an only child.  Patient's ancestors are of Caucasian descent. There is no reported Ashkenazi Jewish ancestry. There is no known consanguinity.  GENETIC COUNSELING ASSESSMENT: George Marshall is a 63 y.o. male with a family history of breast, ovarian and prostate cancer which somewhat suggestive of a hereditary cancer syndrome and predisposition to cancer. We, therefore, discussed and recommended the following at today's visit.   DISCUSSION: We reviewed the characteristics, features and inheritance patterns of hereditary cancer syndromes. We  also discussed genetic testing, including the appropriate family members to test, the process of testing, insurance coverage and turn-around-time for results. His mother's diagnosis of both breast and ovarian cancer, as well as the family history of breast and prostate cancer was suspicious for hereditary breast and ovarian cancer syndrome.  She was tested for 21 genes associated with breast and ovarian cancer, and was determined to be negative for any deleterious mutations within those genes.  As nothing was found in her to be deleterious and therefore increasing the risk for cancer, we do not suggest that George Marshall test.  His paternal family history is not significant enough to warrant testing, and his mother's test was negative.  George Marshall had all questions answered and seemed to understand the information.  PLAN: George Marshall declined genetic testing. George Marshall's questions were answered to his satisfaction today. Our contact information was provided should additional questions or concerns arise.  The patient was seen for a total of 30 minutes, greater than 50% of which was spent face-to-face counseling.  This note will also be sent to the referring provider via the electronic medical record. The patient will be supplied with a summary of this genetic counseling discussion as well as educational information on the discussed hereditary cancer syndromes following the conclusion of their visit.   _______________________________________________________________________ For Office Staff:  Number of people involved in session: 1 Was an Intern/ student involved with case: no

## 2014-05-20 ENCOUNTER — Encounter: Payer: Self-pay | Admitting: *Deleted

## 2014-05-20 ENCOUNTER — Ambulatory Visit (INDEPENDENT_AMBULATORY_CARE_PROVIDER_SITE_OTHER): Payer: BC Managed Care – PPO | Admitting: Pulmonary Disease

## 2014-05-20 ENCOUNTER — Encounter: Payer: Self-pay | Admitting: Pulmonary Disease

## 2014-05-20 VITALS — BP 102/66 | HR 97 | Temp 97.7°F | Ht 71.0 in | Wt 149.0 lb

## 2014-05-20 DIAGNOSIS — J449 Chronic obstructive pulmonary disease, unspecified: Secondary | ICD-10-CM

## 2014-05-20 DIAGNOSIS — J441 Chronic obstructive pulmonary disease with (acute) exacerbation: Secondary | ICD-10-CM

## 2014-05-20 NOTE — Progress Notes (Signed)
   Subjective:    Patient ID: George Marshall, male    DOB: 07/29/1950, 63 y.o.   MRN: [id number]  HPI  PCP - [place]   63/M, ex smoker , quit '02 for FU of Gold B COPD.  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 RUL segmental consolidation & RLL patchy infiltrate.   2012 -Spirometry moderate airway obstruction - FEv1 56%  07/2012 FEv1 53%  05/12/2013 FEv1 slight drop from 2.02 to 1.94- 51%    05/20/2014  Chief Complaint  Patient presents with  . Follow-up    f/u COPD; no complaints  is due for fireman physical Remains On humira every 2 weeks , Off Mtx  -Arthritis is well controlled  Denies dyspnea, CP  Continues to chew tobacco  Spirometry -improved, FEV1 2.30 - 60%    Review of Systems neg for any significant sore throat, dysphagia, itching, sneezing, nasal congestion or excess/ purulent secretions, fever, chills, sweats, unintended wt loss, pleuritic or exertional cp, hempoptysis, orthopnea pnd or change in chronic leg swelling. Also denies presyncope, palpitations, heartburn, abdominal pain, nausea, vomiting, diarrhea or change in bowel or urinary habits, dysuria,hematuria, rash, arthralgias, visual complaints, headache, numbness weakness or ataxia.     Objective:   Physical Exam  Gen. Pleasant, well-nourished, in no distress ENT - no lesions, no post nasal drip Neck: No JVD, no thyromegaly, no carotid bruits Lungs: no use of accessory muscles, no dullness to percussion, decreased without rales or rhonchi  Cardiovascular: Rhythm regular, heart sounds  normal, no murmurs or gallops, no peripheral edema Musculoskeletal: No  deformities, no cyanosis or clubbing        Assessment & Plan:

## 2014-05-20 NOTE — Patient Instructions (Signed)
Lung function is stable  Letter for work

## 2014-05-21 ENCOUNTER — Encounter: Payer: Self-pay | Admitting: Pulmonary Disease

## 2014-05-21 NOTE — Assessment & Plan Note (Signed)
Lung function is stable , actually improved Letter for work given Tobacco cessation advised (chews)

## 2014-07-19 DIAGNOSIS — S62326A Displaced fracture of shaft of fifth metacarpal bone, right hand, initial encounter for closed fracture: Secondary | ICD-10-CM

## 2014-07-19 HISTORY — DX: Displaced fracture of shaft of fifth metacarpal bone, right hand, initial encounter for closed fracture: S62.326A

## 2014-07-20 ENCOUNTER — Ambulatory Visit (INDEPENDENT_AMBULATORY_CARE_PROVIDER_SITE_OTHER): Payer: BC Managed Care – PPO | Admitting: Sports Medicine

## 2014-07-20 ENCOUNTER — Emergency Department
Admission: EM | Admit: 2014-07-20 | Discharge: 2014-07-20 | Disposition: A | Discharge status: Home / Self Care | Payer: BC Managed Care – PPO | Source: Home / Self Care | Attending: Emergency Medicine | Admitting: Emergency Medicine

## 2014-07-20 ENCOUNTER — Encounter: Payer: Self-pay | Admitting: *Deleted

## 2014-07-20 ENCOUNTER — Emergency Department (INDEPENDENT_AMBULATORY_CARE_PROVIDER_SITE_OTHER): Payer: BC Managed Care – PPO

## 2014-07-20 DIAGNOSIS — S62306A Unspecified fracture of fifth metacarpal bone, right hand, initial encounter for closed fracture: Secondary | ICD-10-CM

## 2014-07-20 DIAGNOSIS — S62326A Displaced fracture of shaft of fifth metacarpal bone, right hand, initial encounter for closed fracture: Secondary | ICD-10-CM

## 2014-07-20 DIAGNOSIS — W19XXXA Unspecified fall, initial encounter: Secondary | ICD-10-CM

## 2014-07-20 MED ORDER — HYDROCODONE-ACETAMINOPHEN 5-325 MG PO TABS: 1.0000 | ORAL_TABLET | Freq: Three times a day (TID) | ORAL | Status: DC | PRN

## 2014-07-20 NOTE — Progress Notes (Signed)
   Subjective:    I'm seeing this patient as a consultation for:  Dr. Burnett Harry  CC: Metacarpal fracture  HPI: This is a very pleasant 64 year old male, earlier he was in the shop, and injured his right hand, he had immediate pain, swelling. He presented to urgent care where x-rays showed a slightly displaced and angulated fracture of the right fifth metacarpal shaft. I was called for further evaluation and definitive treatment. Pain is moderate, persistent.  Past medical history, Surgical history, Family history not pertinant except as noted below, Social history, Allergies, and medications have been entered into the medical record, reviewed, and no changes needed.   Review of Systems: No headache, visual changes, nausea, vomiting, diarrhea, constipation, dizziness, abdominal pain, skin rash, fevers, chills, night sweats, weight loss, swollen lymph nodes, body aches, joint swelling, muscle aches, chest pain, shortness of breath, mood changes, visual or auditory hallucinations.   Objective:   General: Well Developed, well nourished, and in no acute distress.  Neuro/Psych: Alert and oriented x3, extra-ocular muscles intact, able to move all 4 extremities, sensation grossly intact. Skin: Warm and dry, no rashes noted.  Respiratory: Not using accessory muscles, speaking in full sentences, trachea midline.  Cardiovascular: Pulses palpable, no extremity edema. Abdomen: Does not appear distended. Right hand: Visible deformity in the mid shaft of the fifth metacarpal, neurovascularly intact distally.  X-rays reviewed, there is a comminuted fracture of the fifth metacarpal shaft that is approximately 20 angulated.  Procedure:  Fracture Reduction   Risks, benefits, and alternatives explained and consent obtained. Time out conducted. Surface prepped with alcohol. 5cc lidocaine infiltrated in a hematoma block. Adequate anesthesia ensured. Fracture reduction: I applied axial traction to the fracture  applying a volarly directed force to reduce the 3 fragments. We then applied and ulnar gutter splint. Subsequent radiographs showed loss of reduction in the ulnar gutter splint, this was removed and a simple dorsal splint was applied, I then re-reduced the fracture fragments, and placed the metacarpal phalangeal joint at 90 of flexion, subsequent imaging showed the fracture in essentially anatomic alignment. Post reduction films obtained showed anatomic/near-anatomic alignment. Pt stable, aftercare and follow-up advised.  Impression and Recommendations:   This case required medical decision making of moderate complexity.

## 2014-07-20 NOTE — ED Provider Notes (Signed)
CSN: [id number]     Arrival date & time 07/20/14  1331 History   First MD Initiated Contact with Patient 07/20/14 1350     Chief Complaint  Patient presents with  . Hand Injury    HPI George Marshall reports accidentally falling onto right hand today. Denies any open wound.  No h/o previous injuries of right hand. Pain right lateral hand is 3 out of 10 intensity, sharp and dull. No radiation. Denies paresthesias or weakness. Denies any new wrist pain, elbow pain, or shoulder pain. Past medical history of rheumatoid arthritis, on Humira. Denies fever chills nausea or vomiting. Denies chest pain or shortness of breath. Past Medical History  Diagnosis Date  . Hyperlipidemia   . GERD (gastroesophageal reflux disease)   . History of syncope 12/2004  . Neutropenia 11/08/2010  . B12 deficiency 11/08/2010  . Iron deficiency 11/08/2010  . Rheumatoid arthritis(714.0) 10/2010  . COPD (chronic obstructive pulmonary disease)     Dr. Elsworth Soho  . Fracture of great toe, right, closed 09/2012    Nondisplaced.  Buddy taped, post-op shoe, ortho eval was done s/p initial dx in urgent care center.  . Pneumonia 10/20/2010    Initial dx 10/17/10--CXR and CT scan. F/u CT 11/06/10 showed resolving RUL and RML pneumonia and resolving reactive mediastinal LAD.    Marland Kitchen Fracture of fifth toe, right, closed 09/26/2012  . Cerumen impaction 08/10/2013  . Choledocholithiasis with acute cholangitis s/p ERCP 09/08/2013 09/12/2013    Dr Stacie Glaze GI    Past Surgical History  Procedure Laterality Date  . Inguinal hernia repair      age 38  . Ercp N/A 09/08/2013    Procedure: ENDOSCOPIC RETROGRADE CHOLANGIOPANCREATOGRAPHY (ERCP);  Surgeon: Arta Silence, MD;  Location: [facility] END[facility]COPY;  Service: Endoscopy;  Laterality: N/A;  . Cholecystectomy N/A 09/10/2013    Procedure: LAPAROSCOPIC CHOLECYSTECTOMY, OPEN CHOLECYSTECTOMY WITH INTRAOPERATIVE CHOLANGIOGRAM;  Surgeon: Merrie Roof, MD;  Location: [facility] ORS;  Service: General;  Laterality: N/A;    Family History  Problem Relation Age of Onset  . Hypertension      parents late 53's  . Breast cancer Mother 30  . Ovarian cancer Mother 71  . Skin cancer Mother   . Breast cancer Maternal Aunt     dx in her 51s  . Breast cancer Maternal Aunt 70  . Breast cancer Maternal Aunt 61  . Breast cancer Paternal Aunt   . Prostate cancer Cousin     double first cousin   History  Substance Use Topics  . Smoking status: Former Smoker -- 1.50 packs/day for 30 years    Types: Cigarettes    Quit date: 06/18/2000  . Smokeless tobacco: Current User    Types: Chew  . Alcohol Use: No    Review of Systems  All other systems reviewed and are negative.   Allergies  Review of patient's allergies indicates no known allergies.  Home Medications   Prior to Admission medications   Medication Sig Start Date End Date Taking? Authorizing Provider  adalimumab (HUMIRA PEN) 40 MG/0.8ML injection Inject 0.8 mLs (40 mg total) into the skin every 14 (fourteen) days. 09/14/13   Megan N Dort, PA-C   BP 154/96 mmHg  Pulse 99  Resp 16  Wt 149 lb (67.586 kg)  SpO2 99% Physical Exam  Constitutional: He is oriented to person, place, and time. He appears well-developed and well-nourished. No distress.  HENT:  Head: Normocephalic and atraumatic.  Eyes: Conjunctivae and EOM are normal. Pupils are  equal, round, and reactive to light. No scleral icterus.  Neck: Normal range of motion.  Cardiovascular: Normal rate.   Pulmonary/Chest: Effort normal.  Abdominal: He exhibits no distension.  Musculoskeletal:       Right shoulder: He exhibits no tenderness.       Right elbow: No tenderness found.       Right wrist: He exhibits normal range of motion, no tenderness and no bony tenderness.       Right hand: He exhibits decreased range of motion, tenderness, bony tenderness and swelling. He exhibits normal capillary refill and no laceration. Normal sensation noted.       Hands: Neurological: He is alert and  oriented to person, place, and time.  Skin: Skin is warm.  Psychiatric: He has a normal mood and affect.  Nursing note and vitals reviewed.  there is no open wound.  ED Course  Procedures (including critical care time) Labs Review Labs Reviewed - No data to display  Imaging Review Dg Hand Complete Right  07/20/2014   CLINICAL DATA:  [place] on RIGHT hand today, pain at fifth MCP joint  EXAM: RIGHT HAND - COMPLETE 3+ VIEW  COMPARISON:  None  FINDINGS: Osseous demineralization.  Joint spaces preserved.  Oblique fracture of the mid to distal fifth metacarpal diaphysis, mildly displaced and demonstrating minimal apex dorsal angulation.  No intra articular extension.  No additional fracture, dislocation, or bone destruction.  IMPRESSION: Minimally displaced and angulated RIGHT fifth metacarpal diaphyseal fracture.   Electronically Signed   By: Lavonia Dana M.D.   On: 07/20/2014 14:13     MDM   1. Closed displaced fracture of shaft of fifth metacarpal bone of right hand, initial encounter    I have discussed and reviewed x-rays with Dr. Dianah Field. Also discussed with patient and treatment options discussed. We all agree that closed reduction is indicated because this is a displaced oblique fracture.  Therefore, referred to Dr. Dianah Field, sports medicine specialist who will see patient this afternoon for further evaluation and definitive treatment, likely with closed reduction and splinting of the displaced oblique fracture of shaft of right fifth metacarpal. Patient understands and agrees.     Jacqulyn Cane, MD 07/20/14 [phone]

## 2014-07-20 NOTE — ED Notes (Signed)
George Marshall reports falling onto right hand today. No h/o previous injuries. H/O arthritis

## 2014-07-20 NOTE — Assessment & Plan Note (Signed)
Reduction of mild induration of the fracture under hematoma block. Ulnar gutter splint placed. Hydrocodone for pain. Next line return in one week, x-ray before visit.  I billed a fracture code for this encounter, all subsequent visits will be post-op checks in the global period.

## 2014-07-21 ENCOUNTER — Encounter: Payer: Self-pay | Admitting: Family Medicine

## 2014-07-27 ENCOUNTER — Encounter: Payer: Self-pay | Admitting: Sports Medicine

## 2014-07-27 ENCOUNTER — Ambulatory Visit (INDEPENDENT_AMBULATORY_CARE_PROVIDER_SITE_OTHER): Payer: BC Managed Care – PPO | Admitting: Sports Medicine

## 2014-07-27 ENCOUNTER — Ambulatory Visit (INDEPENDENT_AMBULATORY_CARE_PROVIDER_SITE_OTHER): Payer: BC Managed Care – PPO

## 2014-07-27 ENCOUNTER — Other Ambulatory Visit: Payer: Self-pay | Admitting: Sports Medicine

## 2014-07-27 DIAGNOSIS — S62306D Unspecified fracture of fifth metacarpal bone, right hand, subsequent encounter for fracture with routine healing: Secondary | ICD-10-CM

## 2014-07-27 DIAGNOSIS — S62609D Fracture of unspecified phalanx of unspecified finger, subsequent encounter for fracture with routine healing: Secondary | ICD-10-CM

## 2014-07-27 DIAGNOSIS — X58XXXD Exposure to other specified factors, subsequent encounter: Secondary | ICD-10-CM

## 2014-07-27 NOTE — Progress Notes (Signed)
  Subjective: Last week I did a closed reduction of an angulated right fifth metacarpal fracture, he returns today in his splint. He is essentially pain-free.  Objective: General: Well-developed, well-nourished, and in no acute distress. Splint is removed, neurovascularly intact distally. Bruising as expected. Still tender over the fracture area  X-rays reviewed and do show a comminuted fracture of the fifth metacarpal in anatomic alignment.  I placed an ulnar gutter splint today.  Assessment/plan:

## 2014-07-27 NOTE — Assessment & Plan Note (Signed)
Doing well post closed reduction of an angulated fifth metacarpal fracture. Ulnar gutter splint placed today. Return in a week for x-rays, no bony callous visualized today.

## 2014-07-28 ENCOUNTER — Encounter: Payer: Self-pay | Admitting: Family Medicine

## 2014-08-03 ENCOUNTER — Ambulatory Visit (INDEPENDENT_AMBULATORY_CARE_PROVIDER_SITE_OTHER): Payer: BC Managed Care – PPO | Admitting: Sports Medicine

## 2014-08-03 ENCOUNTER — Encounter: Payer: Self-pay | Admitting: Sports Medicine

## 2014-08-03 ENCOUNTER — Ambulatory Visit (INDEPENDENT_AMBULATORY_CARE_PROVIDER_SITE_OTHER): Payer: BC Managed Care – PPO

## 2014-08-03 DIAGNOSIS — S62396D Other fracture of fifth metacarpal bone, right hand, subsequent encounter for fracture with routine healing: Secondary | ICD-10-CM

## 2014-08-03 DIAGNOSIS — S62306D Unspecified fracture of fifth metacarpal bone, right hand, subsequent encounter for fracture with routine healing: Secondary | ICD-10-CM

## 2014-08-03 DIAGNOSIS — X58XXXD Exposure to other specified factors, subsequent encounter: Secondary | ICD-10-CM

## 2014-08-03 NOTE — Patient Instructions (Signed)
Use Os-Cal D or Citracal D twice a day

## 2014-08-03 NOTE — Assessment & Plan Note (Signed)
Doing well with an acceptable degree of angulation. Continue ulnar gutter splint for an additional 2 weeks, return to see me in 2 weeks, x-ray before visit.

## 2014-08-03 NOTE — Progress Notes (Signed)
  Subjective:  2 weeks post closed reduction of an angulated right fifth metacarpal fracture, we switched him when ulnar gutter splint at the last visit. He is doing well, no pain.  Objective: General: Well-developed, well-nourished, and in no acute distress.  Right hand: splint is removed, only minimal tenderness to palpation at the fracture site, he does have some lag and range of motion at the metacarpal phalangeal joint as expected. Neurovascularly intact distally.   X-rays reviewed, there is approximately 29 of angulation , apex dorsal at the fifth metacarpal , this is within the acceptable range.  Assessment/plan:

## 2014-08-17 ENCOUNTER — Ambulatory Visit (INDEPENDENT_AMBULATORY_CARE_PROVIDER_SITE_OTHER): Payer: BC Managed Care – PPO

## 2014-08-17 ENCOUNTER — Encounter: Payer: Self-pay | Admitting: Sports Medicine

## 2014-08-17 ENCOUNTER — Ambulatory Visit (INDEPENDENT_AMBULATORY_CARE_PROVIDER_SITE_OTHER): Payer: BC Managed Care – PPO | Admitting: Sports Medicine

## 2014-08-17 DIAGNOSIS — S62306D Unspecified fracture of fifth metacarpal bone, right hand, subsequent encounter for fracture with routine healing: Secondary | ICD-10-CM

## 2014-08-17 NOTE — Progress Notes (Signed)
  Subjective:  4 weeks post fracture of the right fifth metacarpal, we did a closed reduction, some of the reduction was lost however he has remained within tolerable limits.  Objective: General: Well-developed, well-nourished, and in no acute distress. Right hand: Splint is removed, only minimal tenderness over the fracture site, there is only minimal visible deformity, there still is some lag in range of motion as expected. Neurovascularly intact distally.  X-rays reviewed and show approximate 30 of apex dorsal angulation of the fifth metacarpal.  Assessment/plan:

## 2014-08-17 NOTE — Assessment & Plan Note (Signed)
4 weeks post fracture, fracture is within tolerable limits of angulation of the fifth metacarpal. Referral to physical therapy to start gentle range of motion exercises, return to see me in one month.

## 2014-08-23 ENCOUNTER — Ambulatory Visit (INDEPENDENT_AMBULATORY_CARE_PROVIDER_SITE_OTHER): Payer: BC Managed Care – PPO | Admitting: Physical Therapy

## 2014-08-23 DIAGNOSIS — M6281 Muscle weakness (generalized): Secondary | ICD-10-CM

## 2014-08-23 DIAGNOSIS — M25641 Stiffness of right hand, not elsewhere classified: Secondary | ICD-10-CM

## 2014-08-23 DIAGNOSIS — M79641 Pain in right hand: Secondary | ICD-10-CM

## 2014-08-23 DIAGNOSIS — R29898 Other symptoms and signs involving the musculoskeletal system: Secondary | ICD-10-CM

## 2014-08-23 NOTE — Patient Instructions (Signed)
Grip   Squeeze putty or washcloth roll with right hand. Use all fingers and thumb. Hold _3-5___ seconds. Relax for _3-5___ seconds. Repeat __10-15__ times. Do _3-5___ sessions per day. Activity: Squeeze water out of sponge to water plant.*  Copyright  VHI. All rights reserved.   Finger / Thumb: Flexion   Keep right arm supported and wrist straight. Gently bend: Fingers toward palm. . Hold _3-5___ seconds. Repeat _10-15___ times. Do _3-5___ sessions per day. CAUTION: Stretch slowly and gently. Do not force joint.  Copyright  VHI. All rights reserved.   MP Extension (Passive)   Lift __pinky____ finger from table using other hand. Hold __3-5__ seconds. Repeat _10-15___ times. Do __3-5__ sessions per day.  Copyright  VHI. All rights reserved.   Hand Warmer: Make Fists   Gently yet firmly open and close fists. Close thumbs over curled fingers. Repeat _10-15___ times. Do __3-5__ sessions per day.  http://gt2.exer.us/757   Copyright  VHI. All rights reserved.    Laureen Abrahams, PT, DPT 08/23/2014 11:30 AM  [facility] [address] [place], [place] [address]  [phone] (office) [phone] (fax)

## 2014-08-23 NOTE — Therapy (Signed)
[address] [address]  [place], [place], [address] Phone: [phone]   Fax:  [phone]  Physical Therapy Evaluation  Patient Details  Name: George Marshall MRN: [id number] Date of Birth: 1950-12-11 Referring Provider:  Silverio Decamp,*  Encounter Date: 08/23/2014      PT End of Session - 08/23/14 1151    Visit Number 1   Number of Visits 8   Date for PT Re-Evaluation 10/22/14   PT Start Time 1100   PT Stop Time 1140   PT Time Calculation (min) 40 min   Activity Tolerance Patient tolerated treatment well   Behavior During Therapy [facility] for tasks assessed/performed      Past Medical History  Diagnosis Date  . Hyperlipidemia   . GERD (gastroesophageal reflux disease)   . History of syncope 12/2004  . Neutropenia 11/08/2010  . B12 deficiency 11/08/2010  . Iron deficiency 11/08/2010  . Rheumatoid arthritis(714.0) 10/2010    RF+, CCP+  . COPD (chronic obstructive pulmonary disease)     Dr. Elsworth Soho  . Fracture of great toe, right, closed 09/2012    Nondisplaced.  Buddy taped, post-op shoe, ortho eval was done s/p initial dx in urgent care center.  . Pneumonia 10/20/2010    Initial dx 10/17/10--CXR and CT scan. F/u CT 11/06/10 showed resolving RUL and RML pneumonia and resolving reactive mediastinal LAD.    Marland Kitchen Fracture of fifth toe, right, closed 09/26/2012  . Cerumen impaction 08/10/2013  . Choledocholithiasis with acute cholangitis s/p ERCP 09/08/2013 09/12/2013    Dr Stacie Glaze GI   . Closed displaced fracture of shaft of fifth metacarpal bone of right hand 07/2014    Reduced/splinted by Sports Med MD.    Past Surgical History  Procedure Laterality Date  . Inguinal hernia repair      age 73  . Ercp N/A 09/08/2013    Procedure: ENDOSCOPIC RETROGRADE CHOLANGIOPANCREATOGRAPHY (ERCP);  Surgeon: Arta Silence, MD;  Location: [facility] ENDOSCOPY;  Service: Endoscopy;  Laterality: N/A;  . Cholecystectomy N/A 09/10/2013   Procedure: LAPAROSCOPIC CHOLECYSTECTOMY, OPEN CHOLECYSTECTOMY WITH INTRAOPERATIVE CHOLANGIOGRAM;  Surgeon: Merrie Roof, MD;  Location: [facility] ORS;  Service: General;  Laterality: N/A;    There were no vitals taken for this visit.  Visit Diagnosis:  Decreased grip strength of right hand - Plan: PT plan of care cert/re-cert  Stiffness of finger joint, right - Plan: PT plan of care cert/re-cert  Right hand pain - Plan: PT plan of care cert/re-cert      Subjective Assessment - 08/23/14 1101    Symptoms Pt is a 64 y/o male who presents to OPPT s/p R 5th metacarpal fx s/p reduction on 07/20/14.  Pt presents today with difficulty moving R fingers (especially 5th) and ADLs.   Pertinent History pt reports 30% shift after reduction   Limitations House hold activities   Patient Stated Goals make a fist; have hand return to normal   Currently in Pain? Yes   Pain Score 5    Pain Location Hand   Pain Orientation Right   Pain Descriptors / Indicators Dull   Pain Type --  subacute   Pain Onset More than a month ago   Pain Frequency Intermittent   Aggravating Factors  flexing fingers   Pain Relieving Factors extension          [facility] PT Assessment - 08/23/14 1106    Assessment   Medical Diagnosis R 5th metacarpal fx   Onset Date 07/20/14  Next MD Visit 3 weeks   Prior Therapy none   Precautions   Precautions None   Restrictions   Weight Bearing Restrictions No   Balance Screen   Has the patient fallen in the past 6 months Yes   How many times? 1   Has the patient had a decrease in activity level because of a fear of falling?  No   Is the patient reluctant to leave their home because of a fear of falling?  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[employer]; Occupational psychologist   Leisure gardening; Haematologist; going to [employer]   Observation/Other Assessments   Observations R hand dominant   Focus on Therapeutic Outcomes (FOTO)  57 (43% limited; predicted 26% limited   AROM   AROM Assessment Site Wrist;Finger   Right/Left Wrist Right   Right Wrist Extension 61 Degrees   Right Wrist Flexion 65 Degrees   Right Wrist Radial Deviation 22 Degrees   Right Wrist Ulnar Deviation 30 Degrees   Right/Left Finger Right   Right Composite Finger Extension 50%   Right Composite Finger Flexion 50%   PROM   PROM Assessment Site Finger   Right/Left Finger Right   Right Composite Finger Extension 75%   Right Composite Finger Flexion 75%   Strength   Strength Assessment Site Wrist;Hand   Right/Left Wrist Right   Right Wrist Flexion 4+/5   Right Wrist Extension 4+/5   Right Wrist Radial Deviation 5/5   Right Wrist Ulnar Deviation 3/5   Right/Left hand Right;Left   Grip (lbs) 49.3  R 50, 51, 47   Grip (lbs) 67  L 71, 65, 65   Palpation   Palpation tenderness to palpation 5th digit and 5th metacarpal; noticable swelling ulnar side of hand and 5th digit.                  [facility] Adult PT Treatment/Exercise - 08/23/14 1147    Hand Exercises   MCPJ Flexion Self ROM;Right;10 reps   MCPJ Extension Self ROM;Right;10 reps   PIPJ Flexion Self ROM;Right;10 reps   DIPJ Flexion Self ROM;10 reps;Right   Other Hand Exercises wrist open/close x 10 reps; towel squeeze 3-5 sec x 10 reps                PT Education - 08/23/14 1146    Education provided Yes   Education Details HEP   Person(s) Educated Patient   Methods Explanation   Comprehension Verbalized understanding           PT Short Term Goals - 08/23/14 1155    PT SHORT TERM GOAL #1   Title independent with HEP (09/20/14)   Time 4   Period Weeks   Status New   PT SHORT TERM GOAL #2   Title improve 5th digit MCP flexion and ext AROM to 75% for improved function (09/20/14)   Time 4   Period Weeks   Status New   PT SHORT TERM GOAL #3   Title perform  complete fist with R hand to demonstrate improved ROM and strength (09/20/14)   Time 4   Period Weeks   Status New   PT SHORT TERM GOAL #4   Title improve R grip strength by at least 7# for improved function (09/20/14)   Time 4   Period Weeks   Status New           PT Long Term Goals - 08/23/14 1158    PT LONG TERM GOAL #1   Title independent with advanced HEP (10/18/14)   Time 8   Period Weeks   Status New   PT LONG TERM GOAL #2   Title R hand AROM WNL without pain for improved function (10/18/14)   Time 8   Period Weeks   Status New   PT LONG TERM GOAL #3   Title improve grip strength to no less than 3# of L grip strength to demonstrate improved strength and function (10/18/14)   Time 8   Period Weeks   Status New   PT LONG TERM GOAL #4   Title improve R ulnar deviation strength to at least 4/5 for improved strength (10/18/14)   Time 8   Period Weeks   Status New               Plan - 08/23/14 1151    Clinical Impression Statement Pt presents to OPPT with decreased strength and ROM affecting ability to perform ADLs and recreational activities.  Pt will benefit from PT to maximize function and decrease pain.   Pt will benefit from skilled therapeutic intervention in order to improve on the following deficits Pain;Impaired UE functional use;Decreased strength;Decreased range of motion;Impaired flexibility   Rehab Potential Good   PT Frequency 1x / week  recommend 2x/wk however pt requesting 1x due to finances   PT Duration 8 weeks   PT Treatment/Interventions ADLs/Self Care Home Management;Cryotherapy;Ultrasound;Moist  Heat;Functional mobility training;Manual techniques;Passive range of motion;Therapeutic exercise;Patient/family education;Therapeutic activities;Electrical Stimulation   PT Next Visit Plan review HEP and progress strengthening and ROM as tolerated; manual if needed; modalities as needed   Consulted and Agree with Plan of Care Patient         Problem List Patient Active Problem List   Diagnosis Date Noted  . Closed fracture of fifth metacarpal bone of right hand 07/20/2014  . Acute cholecystitis s/p lap/open cholecystectomy 09/10/2013 09/07/2013  . Rheumatoid arthritis(714.0) 12/27/2010  . Anemia 12/27/2010  . Hyperglycemia 12/27/2010  . COPD (chronic obstructive pulmonary disease) 11/21/2010  . Neutropenia 11/08/2010  . B12 deficiency 11/08/2010  . Iron deficiency 11/08/2010  . Sinusitis 09/26/2010  . GERD 05/29/2007  . HYPERLIPIDEMIA 05/23/2007   Laureen Abrahams, PT, DPT 08/23/2014 12:07 PM  [facility] [place], [place], [address] Phone: [phone]   Fax:  [phone]

## 2014-08-30 ENCOUNTER — Ambulatory Visit (INDEPENDENT_AMBULATORY_CARE_PROVIDER_SITE_OTHER): Payer: BC Managed Care – PPO | Admitting: Physical Therapy

## 2014-08-30 DIAGNOSIS — M25641 Stiffness of right hand, not elsewhere classified: Secondary | ICD-10-CM

## 2014-08-30 DIAGNOSIS — R29898 Other symptoms and signs involving the musculoskeletal system: Secondary | ICD-10-CM

## 2014-08-30 DIAGNOSIS — M6281 Muscle weakness (generalized): Secondary | ICD-10-CM

## 2014-08-30 DIAGNOSIS — M79641 Pain in right hand: Secondary | ICD-10-CM

## 2014-08-30 NOTE — Therapy (Signed)
[place] [address] [place], [place], [address] Phone: [phone]   Fax:  [phone]  Physical Therapy Treatment  Patient Details  Name: George Marshall MRN: [id number] Date of Birth: [birth year] Referring Provider:  Silverio Decamp,*  Encounter Date: 08/30/2014      PT End of Session - 08/30/14 1017    Visit Number 2   Number of Visits 8   Date for PT Re-Evaluation 10/22/14   PT Start Time 0930   PT Stop Time 1008   PT Time Calculation (min) 38 min   Activity Tolerance Patient tolerated treatment well   Behavior During Therapy [facility] for tasks assessed/performed      Past Medical History  Diagnosis Date  . Hyperlipidemia   . GERD (gastroesophageal reflux disease)   . History of syncope 12/2004  . Neutropenia 11/08/2010  . B12 deficiency 11/08/2010  . Iron deficiency 11/08/2010  . Rheumatoid arthritis(714.0) 10/2010    RF+, CCP+  . COPD (chronic obstructive pulmonary disease)     Dr. Elsworth Soho  . Fracture of great toe, right, closed 09/2012    Nondisplaced.  Buddy taped, post-op shoe, ortho eval was done s/p initial dx in urgent care center.  . Pneumonia 10/20/2010    Initial dx 10/17/10--CXR and CT scan. F/u CT 11/06/10 showed resolving RUL and RML pneumonia and resolving reactive mediastinal LAD.    Marland Kitchen Fracture of fifth toe, right, closed 09/26/2012  . Cerumen impaction 08/10/2013  . Choledocholithiasis with acute cholangitis s/p ERCP 09/08/2013 09/12/2013    Dr Stacie Glaze GI   . Closed displaced fracture of shaft of fifth metacarpal bone of right hand 07/2014    Reduced/splinted by Sports Med MD.    Past Surgical History  Procedure Laterality Date  . Inguinal hernia repair      age 64  . Ercp N/A 09/08/2013    Procedure: ENDOSCOPIC RETROGRADE CHOLANGIOPANCREATOGRAPHY (ERCP);  Surgeon: Arta Silence, MD;  Location: [facility] ENDOSCOPY;  Service: Endoscopy;  Laterality: N/A;  . Cholecystectomy N/A 09/10/2013   Procedure: LAPAROSCOPIC CHOLECYSTECTOMY, OPEN CHOLECYSTECTOMY WITH INTRAOPERATIVE CHOLANGIOGRAM;  Surgeon: Merrie Roof, MD;  Location: [facility];  Service: General;  Laterality: N/A;    There were no vitals filed for this visit.  Visit Diagnosis:  Decreased grip strength of right hand  Stiffness of finger joint, right  Right hand pain      Subjective Assessment - 08/30/14 0931    Symptoms R hand feels a little more mobile.   Pertinent History pt reports 30% shift after reduction   Patient Stated Goals make a fist; have hand return to normal   Currently in Pain? No/denies  just tightness                       [facility] Adult PT Treatment/Exercise - 08/30/14 0934    Hand Exercises   MCPJ Flexion Self ROM;Right;10 reps   MCPJ Extension Self ROM;Right;10 reps   PIPJ Flexion Self ROM;Right;10 reps   DIPJ Flexion Self ROM;10 reps;Right   Digiticizer R 5th digit 3x10   Fine Motor Coordination Digit   Other Hand Exercises ABCs sign language R hand; thumb and 5th finger gripping activities   Other Hand Exercises hand master light open/squeeze x 10; med ball x 10; velcro thumb and 5th digiit roll x 10 forwards/backwards   Manual Therapy   Manual Therapy Joint mobilization   Joint Mobilization R 5th digit MCP, PIP and DIP joint mobilizations for  increased flexion and extension                  PT Short Term Goals - 08/30/14 1018    PT SHORT TERM GOAL #1   Title independent with HEP (09/20/14)   Status On-going   PT SHORT TERM GOAL #2   Title improve 5th digit MCP flexion and ext AROM to 75% for improved function (09/20/14)   Status On-going   PT SHORT TERM GOAL #3   Title perform complete fist with R hand to demonstrate improved ROM and strength (09/20/14)   Status On-going   PT SHORT TERM GOAL #4   Title improve R grip strength by at least 7# for improved function (09/20/14)   Status On-going           PT Long Term Goals - 08/30/14 1019    PT LONG TERM  GOAL #1   Title independent with advanced HEP (10/18/14)   Status On-going   PT LONG TERM GOAL #2   Title R hand AROM WNL without pain for improved function (10/18/14)   Status On-going   PT LONG TERM GOAL #3   Title improve grip strength to no less than 3# of L grip strength to demonstrate improved strength and function (10/18/14)   Status On-going   PT LONG TERM GOAL #4   Title improve R ulnar deviation strength to at least 4/5 for improved strength (10/18/14)   Status On-going               Plan - 08/30/14 1018    Clinical Impression Statement ROM improving with pt able to almost make a complete fist.  Strength continues to be limited.  Progressing towards goals.   PT Next Visit Plan continue strengthening and ROM; modalities PRN   Consulted and Agree with Plan of Care Patient        Problem List Patient Active Problem List   Diagnosis Date Noted  . Closed fracture of fifth metacarpal bone of right hand 07/20/2014  . Acute cholecystitis s/p lap/open cholecystectomy 09/10/2013 09/07/2013  . Rheumatoid arthritis(714.0) 12/27/2010  . Anemia 12/27/2010  . Hyperglycemia 12/27/2010  . COPD (chronic obstructive pulmonary disease) 11/21/2010  . Neutropenia 11/08/2010  . B12 deficiency 11/08/2010  . Iron deficiency 11/08/2010  . Sinusitis 09/26/2010  . GERD 05/29/2007  . HYPERLIPIDEMIA 05/23/2007   Laureen Abrahams, PT, DPT 08/30/2014 10:20 AM  [facility] [place], [place], [address] Phone: [phone]   Fax:  [phone]

## 2014-09-06 ENCOUNTER — Ambulatory Visit (INDEPENDENT_AMBULATORY_CARE_PROVIDER_SITE_OTHER): Payer: BC Managed Care – PPO | Admitting: Physical Therapy

## 2014-09-06 DIAGNOSIS — M6281 Muscle weakness (generalized): Secondary | ICD-10-CM

## 2014-09-06 DIAGNOSIS — R29898 Other symptoms and signs involving the musculoskeletal system: Secondary | ICD-10-CM

## 2014-09-06 DIAGNOSIS — M25641 Stiffness of right hand, not elsewhere classified: Secondary | ICD-10-CM

## 2014-09-06 NOTE — Therapy (Signed)
[place], [place], [address] Phone: [phone]   Fax:  [phone]  Physical Therapy Treatment  Patient Details  Name: George Marshall MRN: [id number] Date of Birth: January 26, 1951 Referring Provider:  Silverio Decamp,*  Encounter Date: 09/06/2014      PT End of Session - 09/06/14 0936    Visit Number 3   Number of Visits 8   Date for PT Re-Evaluation 10/22/14   PT Start Time 0931   PT Stop Time 1012   PT Time Calculation (min) 41 min   Activity Tolerance Patient tolerated treatment well      Past Medical History  Diagnosis Date  . Hyperlipidemia   . GERD (gastroesophageal reflux disease)   . History of syncope 12/2004  . Neutropenia 11/08/2010  . B12 deficiency 11/08/2010  . Iron deficiency 11/08/2010  . Rheumatoid arthritis(714.0) 10/2010    RF+, CCP+  . COPD (chronic obstructive pulmonary disease)     Dr. Elsworth Soho  . Fracture of great toe, right, closed 09/2012    Nondisplaced.  Buddy taped, post-op shoe, ortho eval was done s/p initial dx in urgent care center.  . Pneumonia 10/20/2010    Initial dx 10/17/10--CXR and CT scan. F/u CT 11/06/10 showed resolving RUL and RML pneumonia and resolving reactive mediastinal LAD.    Marland Kitchen Fracture of fifth toe, right, closed 09/26/2012  . Cerumen impaction 08/10/2013  . Choledocholithiasis with acute cholangitis s/p ERCP 09/08/2013 09/12/2013    Dr Stacie Glaze GI   . Closed displaced fracture of shaft of fifth metacarpal bone of right hand 07/2014    Reduced/splinted by Sports Med MD.    Past Surgical History  Procedure Laterality Date  . Inguinal hernia repair      age 89  . Ercp N/A 09/08/2013    Procedure: ENDOSCOPIC RETROGRADE CHOLANGIOPANCREATOGRAPHY (ERCP);  Surgeon: Arta Silence, MD;  Location: [facility] ENDOSCOPY;  Service: Endoscopy;  Laterality: N/A;  . Cholecystectomy N/A 09/10/2013    Procedure: LAPAROSCOPIC CHOLECYSTECTOMY, OPEN CHOLECYSTECTOMY  WITH INTRAOPERATIVE CHOLANGIOGRAM;  Surgeon: Merrie Roof, MD;  Location: [facility] ORS;  Service: General;  Laterality: N/A;    There were no vitals filed for this visit.  Visit Diagnosis:  Decreased grip strength of right hand  Stiffness of finger joint, right      Subjective Assessment - 09/06/14 0933    Symptoms Pt reports things are getting a little easier.    Patient Stated Goals make a fist; have hand return to normal   Currently in Pain? Other (Comment)  just reports "swollen" feeling in Rt 5th finger             [facility] PT Assessment - 09/06/14 0001    Assessment   Medical Diagnosis R 5th metacarpal fx   Onset Date 07/20/14   Next MD Visit 09/14/14   AROM   AROM Assessment Site Finger;Wrist   Right/Left Finger Right   Right Composite Finger Extension --  Rt 5th PIP 105, DIP 65   Strength   Strength Assessment Site Wrist;Hand   Right/Left Wrist Right   Right Wrist Flexion --  5-/5   Right Wrist Extension 5/5   Right Wrist Radial Deviation 5/5   Right Wrist Ulnar Deviation 4+/5   Right/Left hand Right   Grip (lbs) 56.3  45,59, 65.    Grip (lbs) 67.6  Lt:72,66,65                   [facility]  Adult PT Treatment/Exercise - 09/06/14 0001    Hand Exercises   MCPJ Flexion Self ROM;Right;10 reps   MCPJ Extension Self ROM;Right;10 reps   PIPJ Flexion Self ROM;Right;10 reps   DIPJ Flexion Self ROM;10 reps;Right   Digiticizer R 5th digit 3x10  yellow   Fine Motor Coordination Digit   Other Hand Exercises Velcro board pincher grip with 5th+ thumb, large roller in sup/pronation x 10. Med grip ball squeezes x 20 and finger ext x 20.    Other Hand Exercises Pronation/supination with hammer in hand x 20, 5th and thumb grasp ring pick up (various sizes) x 6 reps x 15 reps   Manual Therapy   Manual Therapy Joint mobilization   Joint Mobilization R 5th digit MCP, PIP and DIP joint mobilizations for increased flexion and extension.  Applied small piece of Kta[place] to 5th  DIP + PIP jt to assist in edema reduction.   gained 5-10 degrees at each jt post mobs                  PT Short Term Goals - 08/30/14 1018    PT SHORT TERM GOAL #1   Title independent with HEP (09/20/14)   Status On-going   PT SHORT TERM GOAL #2   Title improve 5th digit MCP flexion and ext AROM to 75% for improved function (09/20/14)   Status On-going   PT SHORT TERM GOAL #3   Title perform complete fist with R hand to demonstrate improved ROM and strength (09/20/14)   Status On-going   PT SHORT TERM GOAL #4   Title improve R grip strength by at least 7# for improved function (09/20/14)   Status On-going           PT Long Term Goals - 08/30/14 1019    PT LONG TERM GOAL #1   Title independent with advanced HEP (10/18/14)   Status On-going   PT LONG TERM GOAL #2   Title R hand AROM WNL without pain for improved function (10/18/14)   Status On-going   PT LONG TERM GOAL #3   Title improve grip strength to no less than 3# of L grip strength to demonstrate improved strength and function (10/18/14)   Status On-going   PT LONG TERM GOAL #4   Title improve R ulnar deviation strength to at least 4/5 for improved strength (10/18/14)   Status On-going               Plan - 09/06/14 1311    Clinical Impression Statement Pt demonstrated improved Rt grip strength this date. Pt also demonstrated improved Rt hand ROM after manual therapy.  Progressing well towards goals.    Pt will benefit from skilled therapeutic intervention in order to improve on the following deficits Pain;Impaired UE functional use;Decreased strength;Decreased range of motion;Impaired flexibility   Rehab Potential Good   PT Frequency 1x / week   PT Duration 8 weeks   PT Treatment/Interventions ADLs/Self Care Home Management;Cryotherapy;Ultrasound;Moist Heat;Functional mobility training;Manual techniques;Passive range of motion;Therapeutic exercise;Patient/family education;Therapeutic activities;Electrical  Stimulation   PT Next Visit Plan continue strengthening and ROM; modalities PRN   Consulted and Agree with Plan of Care Patient        Problem List Patient Active Problem List   Diagnosis Date Noted  . Closed fracture of fifth metacarpal bone of right hand 07/20/2014  . Acute cholecystitis s/p lap/open cholecystectomy 09/10/2013 09/07/2013  . Rheumatoid arthritis(714.0) 12/27/2010  . Anemia 12/27/2010  . Hyperglycemia 12/27/2010  .  COPD (chronic obstructive pulmonary disease) 11/21/2010  . Neutropenia 11/08/2010  . B12 deficiency 11/08/2010  . Iron deficiency 11/08/2010  . Sinusitis 09/26/2010  . GERD 05/29/2007  . HYPERLIPIDEMIA 05/23/2007   Kerin Perna, PTA 09/06/2014 1:17 PM  [place], [place], [address] Phone: [phone]   Fax:  [phone]

## 2014-09-13 ENCOUNTER — Ambulatory Visit (INDEPENDENT_AMBULATORY_CARE_PROVIDER_SITE_OTHER): Payer: BC Managed Care – PPO | Admitting: Physical Therapy

## 2014-09-13 DIAGNOSIS — M6281 Muscle weakness (generalized): Secondary | ICD-10-CM | POA: Diagnosis not present

## 2014-09-13 DIAGNOSIS — M79641 Pain in right hand: Secondary | ICD-10-CM

## 2014-09-13 DIAGNOSIS — M25641 Stiffness of right hand, not elsewhere classified: Secondary | ICD-10-CM

## 2014-09-13 DIAGNOSIS — R29898 Other symptoms and signs involving the musculoskeletal system: Secondary | ICD-10-CM

## 2014-09-13 NOTE — Therapy (Signed)
[place], [place], [address] Phone: [phone]   Fax:  [phone]  Physical Therapy Treatment  Patient Details  Name: George Marshall MRN: [id number] Date of Birth: 13-Mar-1951 Referring Provider:  Silverio Decamp,*  Encounter Date: 09/13/2014      PT End of Session - 09/13/14 1002    Visit Number 4   Number of Visits 8   Date for PT Re-Evaluation 10/22/14   PT Start Time 0920   PT Stop Time 1000   PT Time Calculation (min) 40 min   Activity Tolerance Patient tolerated treatment well   Behavior During Therapy [facility] for tasks assessed/performed      Past Medical History  Diagnosis Date  . Hyperlipidemia   . GERD (gastroesophageal reflux disease)   . History of syncope 12/2004  . Neutropenia 11/08/2010  . B12 deficiency 11/08/2010  . Iron deficiency 11/08/2010  . Rheumatoid arthritis(714.0) 10/2010    RF+, CCP+  . COPD (chronic obstructive pulmonary disease)     Dr. Elsworth Soho  . Fracture of great toe, right, closed 09/2012    Nondisplaced.  Buddy taped, post-op shoe, ortho eval was done s/p initial dx in urgent care center.  . Pneumonia 10/20/2010    Initial dx 10/17/10--CXR and CT scan. F/u CT 11/06/10 showed resolving RUL and RML pneumonia and resolving reactive mediastinal LAD.    Marland Kitchen Fracture of fifth toe, right, closed 09/26/2012  . Cerumen impaction 08/10/2013  . Choledocholithiasis with acute cholangitis s/p ERCP 09/08/2013 09/12/2013    Dr Stacie Glaze GI   . Closed displaced fracture of shaft of fifth metacarpal bone of right hand 07/2014    Reduced/splinted by Sports Med MD.    Past Surgical History  Procedure Laterality Date  . Inguinal hernia repair      age 50  . Ercp N/A 09/08/2013    Procedure: ENDOSCOPIC RETROGRADE CHOLANGIOPANCREATOGRAPHY (ERCP);  Surgeon: Arta Silence, MD;  Location: [facility] ENDOSCOPY;  Service: Endoscopy;  Laterality: N/A;  . Cholecystectomy N/A 09/10/2013     Procedure: LAPAROSCOPIC CHOLECYSTECTOMY, OPEN CHOLECYSTECTOMY WITH INTRAOPERATIVE CHOLANGIOGRAM;  Surgeon: Merrie Roof, MD;  Location: [facility];  Service: General;  Laterality: N/A;    There were no vitals filed for this visit.  Visit Diagnosis:  Decreased grip strength of right hand  Stiffness of finger joint, right  Right hand pain      Subjective Assessment - 09/13/14 0921    Symptoms Has a sore on lateral R hand; reports he scraped his hand on a bolt and cut hand.   Patient Stated Goals make a fist; have hand return to normal   Currently in Pain? No/denies            [facility] PT Assessment - 09/13/14 1002    Strength   Strength Assessment Site Wrist;Hand   Right/Left Wrist Right   Right Wrist Flexion --  5-/5   Right Wrist Extension 5/5   Right Wrist Radial Deviation 5/5   Right Wrist Ulnar Deviation 4+/5   Grip (lbs) 56.3  45,59, 65.    Grip (lbs) 67.6  Lt:72,66,65                   [facility] Adult PT Treatment/Exercise - 09/13/14 0925    Hand Exercises   Digiticizer R digit 3x10  yellow   Other Hand Exercises Velcro board pincher grip with 5th+ thumb, large roller in sup/pronation x 10. Med grip ball squeezes 2x 20 and  finger ext 2x 20. Finger abdct with resistance band 3x10; 5th digit and thumb squeeze soft ball 3 x 10 with 3 sec hold   Other Hand Exercises Pronation/supination with hammer in hand x 20   Manual Therapy   Manual Therapy Joint mobilization   Joint Mobilization R 5th digit MCP, PIP and DIP joint mobilizations for increased flexion and extension.                    PT Short Term Goals - 08/30/14 1018    PT SHORT TERM GOAL #1   Title independent with HEP (09/20/14)   Status On-going   PT SHORT TERM GOAL #2   Title improve 5th digit MCP flexion and ext AROM to 75% for improved function (09/20/14)   Status On-going   PT SHORT TERM GOAL #3   Title perform complete fist with R hand to demonstrate improved ROM and strength (09/20/14)    Status On-going   PT SHORT TERM GOAL #4   Title improve R grip strength by at least 7# for improved function (09/20/14)   Status On-going           PT Long Term Goals - 08/30/14 1019    PT LONG TERM GOAL #1   Title independent with advanced HEP (10/18/14)   Status On-going   PT LONG TERM GOAL #2   Title R hand AROM WNL without pain for improved function (10/18/14)   Status On-going   PT LONG TERM GOAL #3   Title improve grip strength to no less than 3# of L grip strength to demonstrate improved strength and function (10/18/14)   Status On-going   PT LONG TERM GOAL #4   Title improve R ulnar deviation strength to at least 4/5 for improved strength (10/18/14)   Status On-going               Plan - 09/13/14 1003    Clinical Impression Statement Progressing well towards goals.  Pt may want to d/c next week and transition to home program depending on MD visit 09/14/14.   PT Next Visit Plan continue strengthening and ROM   Consulted and Agree with Plan of Care Patient        Problem List Patient Active Problem List   Diagnosis Date Noted  . Closed fracture of fifth metacarpal bone of right hand 07/20/2014  . Acute cholecystitis s/p lap/open cholecystectomy 09/10/2013 09/07/2013  . Rheumatoid arthritis(714.0) 12/27/2010  . Anemia 12/27/2010  . Hyperglycemia 12/27/2010  . COPD (chronic obstructive pulmonary disease) 11/21/2010  . Neutropenia 11/08/2010  . B12 deficiency 11/08/2010  . Iron deficiency 11/08/2010  . Sinusitis 09/26/2010  . GERD 05/29/2007  . HYPERLIPIDEMIA 05/23/2007   Laureen Abrahams, PT, DPT 09/13/2014 10:04 AM  [facility] [place], [place], [address] Phone: [phone]   Fax:  [phone]

## 2014-09-14 ENCOUNTER — Ambulatory Visit (INDEPENDENT_AMBULATORY_CARE_PROVIDER_SITE_OTHER): Payer: BC Managed Care – PPO | Admitting: Sports Medicine

## 2014-09-14 ENCOUNTER — Encounter: Payer: Self-pay | Admitting: Sports Medicine

## 2014-09-14 VITALS — BP 111/64 | HR 74 | Ht 71.0 in | Wt 149.0 lb

## 2014-09-14 DIAGNOSIS — S62306S Unspecified fracture of fifth metacarpal bone, right hand, sequela: Secondary | ICD-10-CM

## 2014-09-14 NOTE — Progress Notes (Signed)
  Subjective: Approximately 8 weeks post closed reduction of a right fifth metacarpal fracture, doing well with range of motion and strength after physical therapy however does have somewhat of a rotational deformity with scissoring of the fourth and fifth fingers.   Objective: General: Well-developed, well-nourished, and in no acute distress. Right hand: Good strength, good range of motion, no tenderness over the fracture, he does have some rotational deformity of the fifth digit with scissoring of the fourth and fifth, however his hand is completely functional and he doesn't desire any further intervention.  Assessment/plan:

## 2014-09-14 NOTE — Assessment & Plan Note (Signed)
There is some scissoring with a rotational deformity however the hand is highly functional, good range of motion and good strength. He has a single additional physical therapy session, he can return as needed.

## 2014-09-20 ENCOUNTER — Ambulatory Visit (INDEPENDENT_AMBULATORY_CARE_PROVIDER_SITE_OTHER): Payer: BC Managed Care – PPO | Admitting: Physical Therapy

## 2014-09-20 DIAGNOSIS — M79641 Pain in right hand: Secondary | ICD-10-CM

## 2014-09-20 DIAGNOSIS — M25641 Stiffness of right hand, not elsewhere classified: Secondary | ICD-10-CM

## 2014-09-20 DIAGNOSIS — M6281 Muscle weakness (generalized): Secondary | ICD-10-CM | POA: Diagnosis not present

## 2014-09-20 DIAGNOSIS — R29898 Other symptoms and signs involving the musculoskeletal system: Secondary | ICD-10-CM

## 2014-09-20 NOTE — Therapy (Signed)
[place], [place], [address] Phone: [phone]   Fax:  [phone]  Physical Therapy Treatment  Patient Details  Name: George Marshall MRN: [id number] Date of Birth: [birth year] Referring Provider:  Silverio Decamp,*  Encounter Date: 09/20/2014      PT End of Session - 09/20/14 1000    Visit Number 5   Number of Visits 8   PT Start Time 0928   PT Stop Time 0958   PT Time Calculation (min) 30 min   Activity Tolerance Patient tolerated treatment well   Behavior During Therapy [facility] for tasks assessed/performed      Past Medical History  Diagnosis Date  . Hyperlipidemia   . GERD (gastroesophageal reflux disease)   . History of syncope 12/2004  . Neutropenia 11/08/2010  . B12 deficiency 11/08/2010  . Iron deficiency 11/08/2010  . Rheumatoid arthritis(714.0) 10/2010    RF+, CCP+  . COPD (chronic obstructive pulmonary disease)     Dr. Elsworth Soho  . Fracture of great toe, right, closed 09/2012    Nondisplaced.  Buddy taped, post-op shoe, ortho eval was done s/p initial dx in urgent care center.  . Pneumonia 10/20/2010    Initial dx 10/17/10--CXR and CT scan. F/u CT 11/06/10 showed resolving RUL and RML pneumonia and resolving reactive mediastinal LAD.    Marland Kitchen Fracture of fifth toe, right, closed 09/26/2012  . Cerumen impaction 08/10/2013  . Choledocholithiasis with acute cholangitis s/p ERCP 09/08/2013 09/12/2013    Dr Stacie Glaze GI   . Closed displaced fracture of shaft of fifth metacarpal bone of right hand 07/2014    Reduced/splinted by Sports Med MD.    Past Surgical History  Procedure Laterality Date  . Inguinal hernia repair      age 64  . Ercp N/A 09/08/2013    Procedure: ENDOSCOPIC RETROGRADE CHOLANGIOPANCREATOGRAPHY (ERCP);  Surgeon: Arta Silence, MD;  Location: [facility] ENDOSCOPY;  Service: Endoscopy;  Laterality: N/A;  . Cholecystectomy N/A 09/10/2013    Procedure: LAPAROSCOPIC CHOLECYSTECTOMY,  OPEN CHOLECYSTECTOMY WITH INTRAOPERATIVE CHOLANGIOGRAM;  Surgeon: Merrie Roof, MD;  Location: [facility];  Service: General;  Laterality: N/A;    There were no vitals filed for this visit.  Visit Diagnosis:  Decreased grip strength of right hand  Stiffness of finger joint, right  Right hand pain      Subjective Assessment - 09/20/14 0928    Subjective MD appt went well; ready to d/c today.   Limitations House hold activities   Patient Stated Goals make a fist; have hand return to normal   Currently in Pain? No/denies            [facility] PT Assessment - 09/20/14 0934    Observation/Other Assessments   Focus on Therapeutic Outcomes (FOTO)  83 (17% limited)   AROM   Right Wrist Extension 61 Degrees   Right Wrist Flexion 86 Degrees   Right Wrist Radial Deviation 28 Degrees   Right Wrist Ulnar Deviation 36 Degrees   Right/Left Finger Right   Right Composite Finger Flexion --  5th digit: PIP 101, DIP 60  (70 passive), MCP 95   Strength   Strength Assessment Site Wrist   Right/Left Wrist Right   Right Wrist Flexion 5/5   Right Wrist Ulnar Deviation 5/5   Grip (lbs) 60.3  60, 61, 60   Grip (lbs) 71.3  75, 69, 70  PT Education - 09/20/14 [phone]    Education provided Yes   Education Details how to progress grip strength and recommended continued use of R hand   Person(s) Educated Patient   Methods Explanation   Comprehension Verbalized understanding          PT Short Term Goals - 09/20/14 3212    PT SHORT TERM GOAL #1   Title independent with HEP (09/20/14)   Status Achieved   PT SHORT TERM GOAL #2   Title improve 5th digit MCP flexion and ext AROM to 75% for improved function (09/20/14)   Status Achieved   PT SHORT TERM GOAL #3   Title perform complete fist with R hand to demonstrate improved ROM and strength (09/20/14)   Status Achieved   PT SHORT TERM GOAL #4   Title improve R grip strength by at least 7# for improved  function (09/20/14)   Baseline improved 4#   Status Not Met           PT Long Term Goals - 09/20/14 0944    PT LONG TERM GOAL #1   Title independent with advanced HEP (10/18/14)   Status Achieved   PT LONG TERM GOAL #2   Title R hand AROM WNL without pain for improved function (10/18/14)   Status Achieved   PT LONG TERM GOAL #3   Title improve grip strength to no less than 3# of L grip strength to demonstrate improved strength and function (10/18/14)   Status Not Met   PT LONG TERM GOAL #4   Title improve R ulnar deviation strength to at least 4/5 for improved strength (10/18/14)   Status Achieved               Plan - 09/20/14 1000    Clinical Impression Statement Pt ready for d/c.  All LTGs nearly met.  Did not meet grip strength goal, but pt progressing well with grip strength and reports plans to continue with strengthening   PT Next Visit Plan d/c PT   Consulted and Agree with Plan of Care Patient        Problem List Patient Active Problem List   Diagnosis Date Noted  . Closed fracture of fifth metacarpal bone of right hand 07/20/2014  . Acute cholecystitis s/p lap/open cholecystectomy 09/10/2013 09/07/2013  . Rheumatoid arthritis(714.0) 12/27/2010  . Anemia 12/27/2010  . Hyperglycemia 12/27/2010  . COPD (chronic obstructive pulmonary disease) 11/21/2010  . Neutropenia 11/08/2010  . B12 deficiency 11/08/2010  . Iron deficiency 11/08/2010  . Sinusitis 09/26/2010  . GERD 05/29/2007  . HYPERLIPIDEMIA 05/23/2007   Laureen Abrahams, PT, DPT 09/20/2014 10:04 AM  [facility] [place], [place], [address] Phone: [phone]   Fax:  [phone]     PHYSICAL THERAPY DISCHARGE SUMMARY  Visits from Start of Care: 5  Current functional level related to goals / functional outcomes: See above; all goals met except grip strength.     Remaining deficits: Grip strength still decreased from L.   Improved by 4# since initial eval and pt able to verbalize plans for continued exercise and use of R hand   Education / Equipment: HEP  Plan: Patient agrees to discharge.  Patient goals were partially met. Patient is being discharged due to being pleased with the current functional level.  ?????     Laureen Abrahams, PT, DPT 09/20/2014 10:05 AM   [facility]  [place], [place] [address]  [phone] (office) [phone] (fax)

## 2015-04-20 ENCOUNTER — Ambulatory Visit: Payer: BC Managed Care – PPO | Admitting: Pulmonary Disease

## 2015-04-21 ENCOUNTER — Encounter: Payer: Self-pay | Admitting: Adult Health

## 2015-04-21 ENCOUNTER — Ambulatory Visit (HOSPITAL_BASED_OUTPATIENT_CLINIC_OR_DEPARTMENT_OTHER)
Admission: RE | Admit: 2015-04-21 | Discharge: 2015-04-21 | Disposition: A | Discharge status: Home / Self Care | Payer: BC Managed Care – PPO | Source: Ambulatory Visit | Attending: Adult Health | Admitting: Adult Health

## 2015-04-21 ENCOUNTER — Ambulatory Visit (INDEPENDENT_AMBULATORY_CARE_PROVIDER_SITE_OTHER): Payer: BC Managed Care – PPO | Admitting: Adult Health

## 2015-04-21 VITALS — BP 132/82 | HR 80 | Temp 97.7°F | Ht 71.0 in | Wt 159.0 lb

## 2015-04-21 DIAGNOSIS — J441 Chronic obstructive pulmonary disease with (acute) exacerbation: Secondary | ICD-10-CM

## 2015-04-21 DIAGNOSIS — J449 Chronic obstructive pulmonary disease, unspecified: Secondary | ICD-10-CM | POA: Diagnosis not present

## 2015-04-21 DIAGNOSIS — R942 Abnormal results of pulmonary function studies: Secondary | ICD-10-CM | POA: Diagnosis not present

## 2015-04-21 MED ORDER — TIOTROPIUM BROMIDE MONOHYDRATE 18 MCG IN CAPS: 18.0000 ug | ORAL_CAPSULE | Freq: Every day | RESPIRATORY_TRACT | Status: DC

## 2015-04-21 NOTE — Patient Instructions (Signed)
Remain active as able.  Begin Spiriva 1 puff daily .  Chest xray today .  Follow up Dr. Elsworth Soho  In 3 months with spirometry

## 2015-04-24 NOTE — Progress Notes (Signed)
Subjective:    Patient ID: George Marshall, male    DOB: [birth year], 64 y.o.   MRN: [id number]  HPI 64 yo male with GOLD B COPD  He works as Museum/gallery conservator  He does have rheumatoid arthritis on Humira.  TEST  CT chest 10/2010>. RUL segmental consolidation & RLL patchy infiltrate.   2012 -Spirometry moderate airway obstruction - FEv1 56%  07/2012 FEv1 53%  05/12/2013 FEv1 slight drop from 2.02 to 1.94- 51%    04/21/15 Follow up :COPD  Returns for a yearly follow-up for his COPD. She's been doing well without any flare of cough, wheezing or shortness of breath. Says he is very active. He is a Museum/gallery conservator. He needs a letter indicating his lung function test today. Spirometry today showed an FEV1 at 54%, ratio 46, FVC 90%. This is slightly decreased from last year but similar to the previous years. We discussed adding a controller inhaler  He denies any chest pain, orthopnea, PND, leg swelling or hemoptysis.   Past Medical History  Diagnosis Date  . Hyperlipidemia   . GERD (gastroesophageal reflux disease)   . History of syncope 12/2004  . Neutropenia 11/08/2010  . B12 deficiency 11/08/2010  . Iron deficiency 11/08/2010  . Rheumatoid arthritis(714.0) 10/2010    RF+, CCP+  . COPD (chronic obstructive pulmonary disease) (HCC)     Dr. Elsworth Soho  . Fracture of great toe, right, closed 09/2012    Nondisplaced.  Buddy taped, post-op shoe, ortho eval was done s/p initial dx in urgent care center.  . Pneumonia 10/20/2010    Initial dx 10/17/10--CXR and CT scan. F/u CT 11/06/10 showed resolving RUL and RML pneumonia and resolving reactive mediastinal LAD.    Marland Kitchen Fracture of fifth toe, right, closed 09/26/2012  . Cerumen impaction 08/10/2013  . Choledocholithiasis with acute cholangitis s/p ERCP 09/08/2013 09/12/2013    Dr Stacie Glaze GI   . Closed displaced fracture of shaft of fifth metacarpal bone of right hand 07/2014    Reduced/splinted by Sports Med MD.   Current Outpatient  Prescriptions on File Prior to Visit  Medication Sig Dispense Refill  . adalimumab (HUMIRA PEN) 40 MG/0.8ML injection Inject 0.8 mLs (40 mg total) into the skin every 14 (fourteen) days.  0   No current facility-administered medications on file prior to visit.     Review of Systems Constitutional:   No  weight loss, night sweats,  Fevers, chills, fatigue, or  lassitude.  HEENT:   No headaches,  Difficulty swallowing,  Tooth/dental problems, or  Sore throat,                No sneezing, itching, ear ache, nasal congestion, post nasal drip,   CV:  No chest pain,  Orthopnea, PND, swelling in lower extremities, anasarca, dizziness, palpitations, syncope.   GI  No heartburn, indigestion, abdominal pain, nausea, vomiting, diarrhea, change in bowel habits, loss of appetite, bloody stools.   Resp:   No chest wall deformity  Skin: no rash or lesions.  GU: no dysuria, change in color of urine, no urgency or frequency.  No flank pain, no hematuria   MS:  No joint pain or swelling.  No decreased range of motion.  No back pain.  Psych:  No change in mood or affect. No depression or anxiety.  No memory loss.         Objective:   Physical Exam GEN: A/Ox3; pleasant , NAD, elderly , thin   VS reviewed  HEENT:  [place]/AT,  EACs-clear, TMs-wnl, NOSE-clear, THROAT-clear, no lesions, no postnasal drip or exudate noted.   NECK:  Supple w/ fair ROM; no JVD; normal carotid impulses w/o bruits; no thyromegaly or nodules palpated; no lymphadenopathy.  RESP  Decreased BS in bases , no accessory muscle use, no dullness to percussion  CARD:  RRR, no m/r/g  , no peripheral edema, pulses intact, no cyanosis or clubbing.  GI:   Soft & nt; nml bowel sounds; no organomegaly or masses detected.  Musco: Warm bil, no deformities or joint swelling noted.   Neuro: alert, no focal deficits noted.    Skin: Warm, no lesions or rashes         Assessment & Plan:

## 2015-04-25 NOTE — Assessment & Plan Note (Signed)
Remain active as able.  Begin Spiriva 1 puff daily .  Chest xray today .  Follow up Dr. Elsworth Soho  In 3 months with spirometry

## 2015-07-04 ENCOUNTER — Ambulatory Visit (HOSPITAL_BASED_OUTPATIENT_CLINIC_OR_DEPARTMENT_OTHER)
Admission: RE | Admit: 2015-07-04 | Discharge: 2015-07-04 | Disposition: A | Discharge status: Home / Self Care | Payer: BC Managed Care – PPO | Source: Ambulatory Visit | Attending: Family Medicine | Admitting: Family Medicine

## 2015-07-04 ENCOUNTER — Ambulatory Visit (INDEPENDENT_AMBULATORY_CARE_PROVIDER_SITE_OTHER): Payer: BC Managed Care – PPO | Admitting: Family Medicine

## 2015-07-04 ENCOUNTER — Encounter: Payer: Self-pay | Admitting: Family Medicine

## 2015-07-04 ENCOUNTER — Telehealth: Payer: Self-pay | Admitting: Family Medicine

## 2015-07-04 VITALS — BP 130/80 | HR 106 | Temp 98.4°F | Resp 20 | Wt 153.8 lb

## 2015-07-04 DIAGNOSIS — R062 Wheezing: Secondary | ICD-10-CM

## 2015-07-04 DIAGNOSIS — R918 Other nonspecific abnormal finding of lung field: Secondary | ICD-10-CM | POA: Diagnosis not present

## 2015-07-04 DIAGNOSIS — J209 Acute bronchitis, unspecified: Secondary | ICD-10-CM | POA: Diagnosis not present

## 2015-07-04 DIAGNOSIS — R059 Cough, unspecified: Secondary | ICD-10-CM | POA: Insufficient documentation

## 2015-07-04 DIAGNOSIS — R112 Nausea with vomiting, unspecified: Secondary | ICD-10-CM

## 2015-07-04 DIAGNOSIS — R9389 Abnormal findings on diagnostic imaging of other specified body structures: Secondary | ICD-10-CM

## 2015-07-04 DIAGNOSIS — R05 Cough: Secondary | ICD-10-CM | POA: Insufficient documentation

## 2015-07-04 DIAGNOSIS — R509 Fever, unspecified: Secondary | ICD-10-CM | POA: Diagnosis not present

## 2015-07-04 DIAGNOSIS — R0989 Other specified symptoms and signs involving the circulatory and respiratory systems: Secondary | ICD-10-CM | POA: Diagnosis not present

## 2015-07-04 MED ORDER — IPRATROPIUM-ALBUTEROL 0.5-2.5 (3) MG/3ML IN SOLN
3.0000 mL | Freq: Once | RESPIRATORY_TRACT | Status: AC
  Administered 2015-07-04: 3 mL via RESPIRATORY_TRACT

## 2015-07-04 MED ORDER — DOXYCYCLINE HYCLATE 100 MG PO TABS: 100.0000 mg | ORAL_TABLET | Freq: Two times a day (BID) | ORAL | Status: DC

## 2015-07-04 MED ORDER — HYDROCODONE-HOMATROPINE 5-1.5 MG/5ML PO SYRP: 5.0000 mL | ORAL_SOLUTION | Freq: Every evening | ORAL | Status: DC | PRN

## 2015-07-04 MED ORDER — PREDNISONE 50 MG PO TABS: 50.0000 mg | ORAL_TABLET | Freq: Every day | ORAL | Status: DC

## 2015-07-04 NOTE — Patient Instructions (Signed)
Acute Bronchitis Bronchitis is inflammation of the airways that extend from the windpipe into the lungs (bronchi). The inflammation often causes mucus to develop. This leads to a cough, which is the most common symptom of bronchitis.  In acute bronchitis, the condition usually develops suddenly and goes away over time, usually in a couple weeks. Smoking, allergies, and asthma can make bronchitis worse. Repeated episodes of bronchitis may cause further lung problems.  CAUSES Acute bronchitis is most often caused by the same virus that causes a cold. The virus can spread from person to person (contagious) through coughing, sneezing, and touching contaminated objects. SIGNS AND SYMPTOMS   Cough.   Fever.   Coughing up mucus.   Body aches.   Chest congestion.   Chills.   Shortness of breath.   Sore throat.  DIAGNOSIS  Acute bronchitis is usually diagnosed through a physical exam. Your health care provider will also ask you questions about your medical history. Tests, such as chest X-rays, are sometimes done to rule out other conditions.  TREATMENT  Acute bronchitis usually goes away in a couple weeks. Oftentimes, no medical treatment is necessary. Medicines are sometimes given for relief of fever or cough. Antibiotic medicines are usually not needed but may be prescribed in certain situations. In some cases, an inhaler may be recommended to help reduce shortness of breath and control the cough. A cool mist vaporizer may also be used to help thin bronchial secretions and make it easier to clear the chest.  HOME CARE INSTRUCTIONS  Get plenty of rest.   Drink enough fluids to keep your urine clear or pale yellow (unless you have a medical condition that requires fluid restriction). Increasing fluids may help thin your respiratory secretions (sputum) and reduce chest congestion, and it will prevent dehydration.   Take medicines only as directed by your health care provider.  If  you were prescribed an antibiotic medicine, finish it all even if you start to feel better.  Avoid smoking and secondhand smoke. Exposure to cigarette smoke or irritating chemicals will make bronchitis worse. If you are a smoker, consider using nicotine gum or skin patches to help control withdrawal symptoms. Quitting smoking will help your lungs heal faster.   Reduce the chances of another bout of acute bronchitis by washing your hands frequently, avoiding people with cold symptoms, and trying not to touch your hands to your mouth, nose, or eyes.   Keep all follow-up visits as directed by your health care provider.  SEEK MEDICAL CARE IF: Your symptoms do not improve after 1 week of treatment.  SEEK IMMEDIATE MEDICAL CARE IF:  You develop an increased fever or chills.   You have chest pain.   You have severe shortness of breath.  You have bloody sputum.   You develop dehydration.  You faint or repeatedly feel like you are going to pass out.  You develop repeated vomiting.  You develop a severe headache. MAKE SURE YOU:   Understand these instructions.  Will watch your condition.  Will get help right away if you are not doing well or get worse.   This information is not intended to replace advice given to you by your health care provider. Make sure you discuss any questions you have with your health care provider.   Document Released: 07/12/2004 Document Revised: 06/25/2014 Document Reviewed: 11/25/2012 Elsevier Interactive Patient Education 2016 [place], [place], consider humidifier use. - Robitussin for cough during day, Hycodan at night only - doxycycline (  VIBRA-TABS) 100 MG tablet; Take 1 tablet (100 mg total) by mouth 2 (two) times daily.  Dispense: 20 tablet; Refill: 0 - predniSONE (DELTASONE) 50 MG tablet; Take 1 tablet (50 mg total) by mouth daily with breakfast.  Dispense: 5 tablet; Refill: 0 - DG Chest 2 View; Future

## 2015-07-04 NOTE — Telephone Encounter (Signed)
Please call patient: - Patient's chest x-ray did not read as pneumonia, however there is a small questionable infiltrate by review of imaging. - There is also a small area that is either a nodule or a nipple shadow on the right side. In looking at prior x-rays, this is very possibly shadowing only, but will need to repeat x-ray with markers over the area after acute illness has subsided, to be certain not a new nodule. - I have placed future order for this to be completed in 3-4 weeks, at same location as today.

## 2015-07-04 NOTE — Progress Notes (Signed)
Subjective:    Patient ID: George Marshall   DOB: 30-Nov-1950, 65 y.o.    MRN: [id number]  HPI  Cough: Patient presents with a 10 day history of cough, nasal congestion, rhinorrhea, postnasal drip that has progressed with a productive cough and fatigue. Patient endorses an occasional sinus headache as well. He denies fever, chills nausea, vomit, diarrhea or rash. Patient is prescribed Spiriva, but he states he does not take it, he does not have COPD. Patient is on Humira, last dose 4 days ago. Patient denies any shortness of breath or wheezing. Former smoker  Past Medical History  Diagnosis Date  . Hyperlipidemia   . GERD (gastroesophageal reflux disease)   . History of syncope 12/2004  . Neutropenia 11/08/2010  . B12 deficiency 11/08/2010  . Iron deficiency 11/08/2010  . Rheumatoid arthritis(714.0) 10/2010    RF+, CCP+  . COPD (chronic obstructive pulmonary disease) ([facility])     Dr. Elsworth Soho  . Fracture of great toe, right, closed 09/2012    Nondisplaced.  Buddy taped, post-op shoe, ortho eval was done s/p initial dx in urgent care center.  . Pneumonia 10/20/2010    Initial dx 10/17/10--CXR and CT scan. F/u CT 11/06/10 showed resolving RUL and RML pneumonia and resolving reactive mediastinal LAD.    Marland Kitchen Fracture of fifth toe, right, closed 09/26/2012  . Cerumen impaction 08/10/2013  . Choledocholithiasis with acute cholangitis s/p ERCP 09/08/2013 09/12/2013    Dr Stacie Glaze GI   . Closed displaced fracture of shaft of fifth metacarpal bone of right hand 07/2014    Reduced/splinted by Sports Med MD.   No Known Allergies Past Surgical History  Procedure Laterality Date  . Inguinal hernia repair      age 65  . Ercp N/A 09/08/2013    Procedure: ENDOSCOPIC RETROGRADE CHOLANGIOPANCREATOGRAPHY (ERCP);  Surgeon: Arta Silence, MD;  Location: [facility] ENDOSCOPY;  Service: Endoscopy;  Laterality: N/A;  . Cholecystectomy N/A 09/10/2013    Procedure: LAPAROSCOPIC CHOLECYSTECTOMY, OPEN CHOLECYSTECTOMY WITH  INTRAOPERATIVE CHOLANGIOGRAM;  Surgeon: Merrie Roof, MD;  Location: [facility] ORS;  Service: General;  Laterality: N/A;   Social History  Substance Use Topics  . Smoking status: Former Smoker -- 1.50 packs/day for 30 years    Types: Cigarettes    Quit date: 06/18/2000  . Smokeless tobacco: Current User    Types: Chew  . Alcohol Use: No    Review of Systems Negative, with the exception of above mentioned in HPI     Objective:   Physical Exam BP 130/80 mmHg  Pulse 106  Temp(Src) 98.4 F (36.9 C) (Oral)  Resp 20  Wt 153 lb 12 oz (69.741 kg)  SpO2 97% Body mass index is 21.45 kg/(m^2). Gen: Afebrile. No acute distress. Nontoxic in appearance. Well developed, well nourished, Caucasian male. HENT: AT. [place]. Bilateral TM unable to be visualized secondary to cerumen impaction. MMM, no oral lesions. Bilateral nares with mild erythema, no swelling. Throat mild erythema, no exudates. Cough present, Hoarseness present Eyes:Pupils Equal Round Reactive to light, Extraocular movements intact,  Conjunctiva without redness, discharge or icterus. Neck/lymp/endocrine: Supple, bilateral anterior cervical lymphadenopathy CV: RRR (tachycardia- mild consistent with prior) Chest: Right lower lobe crackles, expiratory wheeze bibasilar lung fields. Abd: Soft. NTND. BS present Skin: No rashes, purpura or petechiae.    Assessment & Plan:  George Marshall is a 65 y.o. present for acute OV  1. Wheeze/ Cough - DuoNeb treatment in office today with improvement on lung sounds and breathing. - Patient  was encouraged to take Singulair as prescribed.  2. Acute bronchitis, unspecified organism - Concern for progression to pneumonia, will obtain chest x-ray today. - Rest, Hydrate, consider humidifier use. - Robitussin for cough. - doxycycline (VIBRA-TABS) 100 MG tablet; Take 1 tablet (100 mg total) by mouth 2 (two) times daily.  Dispense: 20 tablet; Refill: 0 - predniSONE (DELTASONE) 50 MG tablet; Take 1 tablet  (50 mg total) by mouth daily with breakfast.  Dispense: 5 tablet; Refill: 0 - DG Chest 2 View; Future - Follow-up in one week, if no improvement. If worsening patient will need to be seen immediately.

## 2015-07-05 MED ORDER — ONDANSETRON 4 MG PO TBDP: 4.0000 mg | ORAL_TABLET | Freq: Three times a day (TID) | ORAL | Status: DC | PRN

## 2015-07-05 NOTE — Telephone Encounter (Signed)
I can call in Zofran for his vomiting. Is this after medication use? If he feels it is associated with the medication we can also switch antibiotics,as well. Make certain he is taking with something on his stomach if able.  Please advise.

## 2015-07-05 NOTE — Telephone Encounter (Signed)
Spoke with patient he states he does not feel like antibiotic is causing this he had vomiting prior to taking his first dose and has only taken 1 dose since getting it. Zofran Rx sent to patient pharmacy. Patient instructed to have something in stomach prior to taking his antibiotic. Patient verbalized understanding.

## 2015-07-05 NOTE — Telephone Encounter (Signed)
Spoke with patient reviewed xray results and instructions. Patient states he is now having vomiting and wants to know what he can do for this. Please advise.

## 2015-07-28 ENCOUNTER — Encounter: Payer: Self-pay | Admitting: Pulmonary Disease

## 2015-07-28 ENCOUNTER — Ambulatory Visit (INDEPENDENT_AMBULATORY_CARE_PROVIDER_SITE_OTHER): Payer: BC Managed Care – PPO | Admitting: Pulmonary Disease

## 2015-07-28 VITALS — BP 147/72 | HR 119 | Temp 97.6°F | Ht 71.0 in | Wt 155.0 lb

## 2015-07-28 DIAGNOSIS — J449 Chronic obstructive pulmonary disease, unspecified: Secondary | ICD-10-CM | POA: Diagnosis not present

## 2015-07-28 NOTE — Patient Instructions (Signed)
Lung function is stable- 52% We discussed screening CT scan for lung cancer Letter given

## 2015-07-28 NOTE — Progress Notes (Signed)
   Subjective:    Patient ID: George Marshall, male    DOB: [birth year], 65 y.o.   MRN: [id number]  HPI  PCP - [place]   64/M, ex smoker , quit '02 for FU of Gold B COPD.  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 Follow-up    Pt is here for a 3 month follow up. Pt c/o sinus drainage. Denies any wheezing, chest congestion/tightness, SOB, wheezing, fever, nausea or vomiting.    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 RUL segmental consolidation & RLL patchy infiltrate.   2012 -Spirometry moderate airway obstruction - FEv1 56%  07/2012 FEv1 53%  05/12/2013 FEv1 51%    Review of Systems neg for any significant sore throat, dysphagia, itching, sneezing, nasal congestion or excess/ purulent secretions, fever, chills, sweats, unintended wt loss, pleuritic or exertional cp, hempoptysis, orthopnea pnd or change in chronic leg swelling. Also denies presyncope, palpitations, heartburn, abdominal pain, nausea, vomiting, diarrhea or change in bowel or urinary habits, dysuria,hematuria, rash, arthralgias, visual complaints, headache, numbness weakness or ataxia.     Objective:   Physical Exam  Gen. Pleasant, well-nourished, in no distress ENT - no lesions, no post nasal drip Neck: No JVD, no  thyromegaly, no carotid bruits Lungs: no use of accessory muscles, no dullness to percussion, decreased without rales or rhonchi  Cardiovascular: Rhythm regular, heart sounds  normal, no murmurs or gallops, no peripheral edema Musculoskeletal: No deformities, no cyanosis or clubbing , no wrist swelling       Assessment & Plan:

## 2015-07-28 NOTE — Assessment & Plan Note (Signed)
Lung function is stable- 52% We discussed screening CT scan for lung cancer Letter given No meds required, ok to stay off spiriva - ct cardio exercise  BP slight high today- FU with PCP

## 2015-07-29 ENCOUNTER — Encounter: Payer: Self-pay | Admitting: Family Medicine

## 2015-08-01 ENCOUNTER — Encounter: Payer: Self-pay | Admitting: Family Medicine

## 2015-09-02 ENCOUNTER — Encounter: Payer: Self-pay | Admitting: Family Medicine

## 2015-09-07 ENCOUNTER — Telehealth: Payer: Self-pay | Admitting: Pulmonary Disease

## 2015-09-07 ENCOUNTER — Encounter: Payer: Self-pay | Admitting: *Deleted

## 2015-09-07 NOTE — Telephone Encounter (Signed)
ok 

## 2015-09-07 NOTE — Telephone Encounter (Signed)
Called patient regarding lung cancer screening program. Unfortunately patient does not qualify for the program as it has been over 15 years since he quit smoking. Patient is also aware of this. Will sign off and notify Dr. Elsworth Soho.

## 2016-01-30 ENCOUNTER — Encounter: Payer: Self-pay | Admitting: Family Medicine

## 2016-05-03 ENCOUNTER — Ambulatory Visit: Payer: BC Managed Care – PPO | Admitting: Adult Health

## 2016-06-18 DIAGNOSIS — R030 Elevated blood-pressure reading, without diagnosis of hypertension: Secondary | ICD-10-CM

## 2016-06-18 HISTORY — DX: Elevated blood-pressure reading, without diagnosis of hypertension: R03.0

## 2016-06-26 ENCOUNTER — Ambulatory Visit (INDEPENDENT_AMBULATORY_CARE_PROVIDER_SITE_OTHER): Payer: BC Managed Care – PPO | Admitting: Family Medicine

## 2016-06-26 ENCOUNTER — Encounter: Payer: Self-pay | Admitting: Family Medicine

## 2016-06-26 VITALS — BP 161/97 | HR 103 | Temp 97.9°F | Resp 16 | Ht 71.0 in | Wt 157.5 lb

## 2016-06-26 DIAGNOSIS — I10 Essential (primary) hypertension: Secondary | ICD-10-CM

## 2016-06-26 DIAGNOSIS — J301 Allergic rhinitis due to pollen: Secondary | ICD-10-CM

## 2016-06-26 MED ORDER — FLUTICASONE PROPIONATE 50 MCG/ACT NA SUSP: 2.0000 | Freq: Every day | NASAL | 12 refills | Status: DC

## 2016-06-26 MED ORDER — FEXOFENADINE HCL 180 MG PO TABS: 180.0000 mg | ORAL_TABLET | Freq: Every day | ORAL | 11 refills | Status: DC

## 2016-06-26 NOTE — Progress Notes (Signed)
Pre visit review using our clinic review tool, if applicable. No additional management support is needed unless otherwise documented below in the visit note. 

## 2016-06-26 NOTE — Progress Notes (Signed)
OFFICE VISIT  06/26/2016   CC:  Chief Complaint  Patient presents with  . URI    x 2 nasal congestion, post nasal drip, has tried zyrtec and mucinex which has not helped   HPI:    Patient is a 66 y.o. Caucasian male who presents for respiratory complaints. Approx 2 months of nasal congestion, postnasal drip.  Only occ cough.  Occ ST.  No sneezing or ear symptoms. No sinus pressure or pain.  No upper teeth pain.  No HA.  He tried zyrtec and mucinex for 2 weeks at the beginning of symptoms and this did not help.  No fevers.  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  Has been trying to eat low Na diet, exercising 3 times per week.   He asks if he can continue to try dietary measures before starting a medication. Never checks bp at home.     Past Medical History:  Diagnosis Date  . B12 deficiency 11/08/2010  . Cerumen impaction 08/10/2013  . Choledocholithiasis with acute cholangitis s/p ERCP 09/08/2013 09/12/2013   Dr Stacie Glaze GI   . Closed displaced fracture of shaft of fifth metacarpal bone of right hand 07/2014   Reduced/splinted by Sports Med MD.  . COPD (chronic obstructive pulmonary disease) ([place])    Dr. Elsworth Soho (as of 07/2015 FEV1 stable at 52%)  . Fracture of fifth toe, right, closed 09/26/2012  . Fracture of great toe, right, closed 09/2012   Nondisplaced.  Buddy taped, post-op shoe, ortho eval was done s/p initial dx in urgent care center.  Marland Kitchen GERD (gastroesophageal reflux disease)   . History of syncope 12/2004  . Hyperlipidemia   . Iron deficiency 11/08/2010  . Neutropenia 11/08/2010  . Pneumonia 10/20/2010   Initial dx 10/17/10--CXR and CT scan. F/u CT 11/06/10 showed resolving RUL and RML pneumonia and resolving reactive mediastinal LAD.    Marland Kitchen Rheumatoid arthritis(714.0) 10/2010   RF+, CCP+; managed well with Humira as of 01/2016 (Dr. Amil Amen)    Past Surgical History:  Procedure Laterality Date  . CHOLECYSTECTOMY N/A 09/10/2013   Procedure: LAPAROSCOPIC CHOLECYSTECTOMY,  OPEN CHOLECYSTECTOMY WITH INTRAOPERATIVE CHOLANGIOGRAM;  Surgeon: Merrie Roof, MD;  Location: [facility] ORS;  Service: General;  Laterality: N/A;  . ERCP N/A 09/08/2013   Procedure: ENDOSCOPIC RETROGRADE CHOLANGIOPANCREATOGRAPHY (ERCP);  Surgeon: Arta Silence, MD;  Location: [facility] ENDOSCOPY;  Service: Endoscopy;  Laterality: N/A;  . INGUINAL HERNIA REPAIR     age 40    Outpatient Medications Prior to Visit  Medication Sig Dispense Refill  . adalimumab (HUMIRA PEN) 40 MG/0.8ML injection Inject 0.8 mLs (40 mg total) into the skin every 14 (fourteen) days.  0  . HYDROcodone-homatropine (HYCODAN) 5-1.5 MG/5ML syrup Take 5 mLs by mouth at bedtime as needed for cough. (Patient not taking: Reported on 06/26/2016) 120 mL 0  . ondansetron (ZOFRAN-ODT) 4 MG disintegrating tablet Take 1 tablet (4 mg total) by mouth every 8 (eight) hours as needed for nausea or vomiting. (Patient not taking: Reported on 06/26/2016) 20 tablet 0  . tiotropium (SPIRIVA HANDIHALER) 18 MCG inhalation capsule Place 1 capsule (18 mcg total) into inhaler and inhale daily. 30 capsule 2   No facility-administered medications prior to visit.     No Known Allergies  ROS As per HPI  PE: Blood pressure (!) 161/97, pulse (!) 103, temperature 97.9 F (36.6 C), temperature source Oral, resp. rate 16, height 5\' 11"  (1.803 m), weight 157 lb 8 oz (71.4 kg), SpO2 99 %.  VS: noted--normal. Gen: alert, NAD, NONTOXIC APPEARING. HEENT: eyes without injection, drainage, or swelling.  Ears: EACs clear, TMs with normal light reflex and landmarks.  Nose: Clear rhinorrhea, with some dried, crusty exudate adherent to mildly injected mucosa.  No purulent d/c.  No paranasal sinus TTP.  No facial swelling.  Throat and mouth without focal lesion.  No pharyngial swelling, erythema, or exudate.   Neck: supple, no LAD.   LUNGS: CTA bilat, nonlabored resps.   CV: RRR, no m/r/g. EXT: no c/c/e SKIN: no rash  LABS:  none  IMPRESSION AND PLAN:  1) Allergic  rhinitis; no sign of sinus infection. Will rx trial of flonase qd and allegra 180mg  qd.  2) HTN: suspect pt has had this for a while.   Instructions: Check your blood pressure and heart rate at home once every other day.  Write these numbers down and bring them with you to next appt in 1 month. DASH diet given to pt. Gave pt handout on how to check home bp correctly. No med rx'd today but strongly suspect we'll have to get him on one at f/u visit in 1 mo.  An After Visit Summary was printed and given to the patient.  FOLLOW UP: Return in about 4 weeks (around 07/24/2016) for f/u HTN.  Signed:  Crissie Sickles, MD           06/26/2016

## 2016-06-26 NOTE — Patient Instructions (Signed)
Check your blood pressure and heart rate at home once every other day.  Write these numbers down and bring them with you to next appt in 1 month.

## 2016-07-05 ENCOUNTER — Ambulatory Visit: Payer: BC Managed Care – PPO | Admitting: Pulmonary Disease

## 2016-07-12 ENCOUNTER — Ambulatory Visit (INDEPENDENT_AMBULATORY_CARE_PROVIDER_SITE_OTHER): Payer: BC Managed Care – PPO | Admitting: Pulmonary Disease

## 2016-07-12 ENCOUNTER — Encounter: Payer: Self-pay | Admitting: Pulmonary Disease

## 2016-07-12 VITALS — BP 158/84 | HR 98 | Ht 71.0 in | Wt 154.0 lb

## 2016-07-12 DIAGNOSIS — J329 Chronic sinusitis, unspecified: Secondary | ICD-10-CM | POA: Diagnosis not present

## 2016-07-12 DIAGNOSIS — J449 Chronic obstructive pulmonary disease, unspecified: Secondary | ICD-10-CM | POA: Diagnosis not present

## 2016-07-12 NOTE — Assessment & Plan Note (Signed)
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

## 2016-07-12 NOTE — Patient Instructions (Signed)
Breathing test is stable Letter will be given

## 2016-07-12 NOTE — Progress Notes (Signed)
   Subjective:    Patient ID: George Marshall, male    DOB: [birth year], 66 y.o.   MRN: [id number]  HPI  PCP - [place]   65/M, ex smoker , quit '02 for FU of Gold B COPD.  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 Follow-up    pt denies any breathing complaints.  needs a letter for fire dept.      Annual FU  He had his yearly physical in December He is able to use his breathing apparatus without any problems He has started an exercise regimen is able to walk on the treadmill 30 minutes about 3 times a week at the gym  Remains On humira every 2 weeks , Off Mtx  -Arthritis is well controlled  Denies dyspnea, CP  Continues to chew tobacco  Spirometry has been stable on prior visits - FEV1 is 48% today   Significant tests/ events  Admitted 5/11-13/12 for RUL pneumonia, neutropenia >> diagnosed with RA, pos factor, high CCP  Neutropenia/Anemia/B12 Deficiency was evaluated by Heme& bone marrow bx -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 RUL segmental consolidation & RLL patchy infiltrate.   2012 -Spirometry moderate airway obstruction - FEv1 56%  07/2012 FEv1 53%  05/12/2013 FEv1 51%  07/2015 FEV1 52%   Review of Systems Patient denies significant dyspnea,cough, hemoptysis,  chest pain, palpitations, pedal edema, orthopnea, paroxysmal nocturnal dyspnea, lightheadedness, nausea, vomiting, abdominal or  leg pains      Objective:   Physical Exam  Gen. Pleasant, well-nourished, in no distress ENT - no lesions, no post nasal drip Neck: No JVD, no thyromegaly, no carotid bruits Lungs: no use of accessory muscles, no dullness to percussion, clear without rales or rhonchi  Cardiovascular: Rhythm regular, heart sounds  normal, no murmurs or gallops, no peripheral  edema Musculoskeletal: No deformities, no cyanosis or clubbing        Assessment & Plan:

## 2016-07-12 NOTE — Assessment & Plan Note (Signed)
Stable

## 2016-07-16 ENCOUNTER — Encounter: Payer: Self-pay | Admitting: Family Medicine

## 2016-07-24 ENCOUNTER — Encounter: Payer: Self-pay | Admitting: Family Medicine

## 2016-07-24 ENCOUNTER — Ambulatory Visit (INDEPENDENT_AMBULATORY_CARE_PROVIDER_SITE_OTHER): Payer: BC Managed Care – PPO | Admitting: Family Medicine

## 2016-07-24 VITALS — BP 167/85 | HR 101 | Temp 98.0°F | Resp 16 | Ht 71.0 in | Wt 156.2 lb

## 2016-07-24 DIAGNOSIS — R030 Elevated blood-pressure reading, without diagnosis of hypertension: Secondary | ICD-10-CM

## 2016-07-24 NOTE — Progress Notes (Signed)
Pre visit review using our clinic review tool, if applicable. No additional management support is needed unless otherwise documented below in the visit note. 

## 2016-07-24 NOTE — Progress Notes (Signed)
OFFICE VISIT  07/24/2016   CC:  Chief Complaint  Patient presents with  . Follow-up    HTN, pt is fasting.      HPI:    Patient is a 66 y.o. Caucasian male who presents for 1 mo f/u HTN. Home bp's: checking qod in morning and getting 120s over 60s-70s, HR avg 70.  Occ random check later in the day also normal.  Unable to follow DASH diet except he doesn't add salt to his food.  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  Pt could not locate these for me.  ROS: no CP, no HA's, no vision c/o, no SOB, on palpitations, no fatigue, no dizziness.  Past Medical History:  Diagnosis Date  . B12 deficiency 11/08/2010  . Cerumen impaction 08/10/2013  . Choledocholithiasis with acute cholangitis s/p ERCP 09/08/2013 09/12/2013   Dr Stacie Glaze GI   . Closed displaced fracture of shaft of fifth metacarpal bone of right hand 07/2014   Reduced/splinted by Sports Med MD.  . COPD (chronic obstructive pulmonary disease) ([place])    Dr. Elsworth Soho (as of 07/2015 FEV1 stable at 52%)  F/u spirometry stable 06/2016 with Dr. Elsworth Soho.  Spiriva trial in the past--no help.  . Fracture of fifth toe, right, closed 09/26/2012  . Fracture of great toe, right, closed 09/2012   Nondisplaced.  Buddy taped, post-op shoe, ortho eval was done s/p initial dx in urgent care center.  Marland Kitchen GERD (gastroesophageal reflux disease)   . History of syncope 12/2004  . Hyperlipidemia   . Iron deficiency 11/08/2010  . Neutropenia 11/08/2010  . Pneumonia 10/20/2010   Initial dx 10/17/10--CXR and CT scan. F/u CT 11/06/10 showed resolving RUL and RML pneumonia and resolving reactive mediastinal LAD.    Marland Kitchen Rheumatoid arthritis(714.0) 10/2010   RF+, CCP+; managed well with Humira as of 01/2016 (Dr. Amil Amen)    Past Surgical History:  Procedure Laterality Date  . CHOLECYSTECTOMY N/A 09/10/2013   Procedure: LAPAROSCOPIC CHOLECYSTECTOMY, OPEN CHOLECYSTECTOMY WITH INTRAOPERATIVE  CHOLANGIOGRAM;  Surgeon: Merrie Roof, MD;  Location: [facility];  Service: General;  Laterality: N/A;  . ERCP N/A 09/08/2013   Procedure: ENDOSCOPIC RETROGRADE CHOLANGIOPANCREATOGRAPHY (ERCP);  Surgeon: Arta Silence, MD;  Location: [facility] ENDOSCOPY;  Service: Endoscopy;  Laterality: N/A;  . INGUINAL HERNIA REPAIR     age 87    Outpatient Medications Prior to Visit  Medication Sig Dispense Refill  . adalimumab (HUMIRA PEN) 40 MG/0.8ML injection Inject 0.8 mLs (40 mg total) into the skin every 14 (fourteen) days.  0  . fexofenadine (ALLEGRA) 180 MG tablet Take 1 tablet (180 mg total) by mouth daily. (Patient taking differently: Take 180 mg by mouth daily as needed. ) 30 tablet 11  . fluticasone (FLONASE) 50 MCG/ACT nasal spray Place 2 sprays into both nostrils daily. (Patient taking differently: Place 2 sprays into both nostrils daily as needed. ) 16 g 12   No facility-administered medications prior to visit.     No Known Allergies  ROS As per HPI  PE: Blood pressure (!) 167/85, pulse (!) 101, temperature 98 F (36.7 C), temperature source Oral, resp. rate 16, height 5\' 11"  (1.803 m), weight 156 lb 4 oz (70.9 kg), SpO2 98 %. Gen: Alert, well appearing.  Patient is oriented to person, place, time, and situation. CV: RRR, no m/r/g.   LUNGS: CTA bilat, nonlabored resps, good aeration in all lung fields. EXT: no clubbing,  cyanosis, or edema.   LABS:    Chemistry      Component Value Date/Time   NA 137 10/14/2013 1456   K 3.6 10/14/2013 1456   CL 101 10/14/2013 1456   CO2 28 10/14/2013 1456   BUN 12 10/14/2013 1456   CREATININE 0.7 10/14/2013 1456   CREATININE 0.87 03/08/2012 1332      Component Value Date/Time   CALCIUM 8.4 10/14/2013 1456   ALKPHOS 116 10/14/2013 1456   AST 21 10/14/2013 1456   ALT 21 10/14/2013 1456   BILITOT 3.1 (H) 10/14/2013 1456     Lab Results  Component Value Date   CHOL 161 05/08/2007   HDL 32.0 (L) 05/08/2007   LDLCALC 111 (H) 05/08/2007    LDLDIRECT 191.5 05/17/2006   TRIG 88 05/08/2007   CHOLHDL 5.0 CALC 05/08/2007   Lab Results  Component Value Date   TSH 0.704 03/08/2012   IMPRESSION AND PLAN:  Elevated bp: 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  We checked his bp cuff against ours today and results matched reasonably well. We'll have him continue to monitor bp at home, call if persistently >140/90. Low Na diet encouraged.  An After Visit Summary was printed and given to the patient.  FOLLOW UP: Return in about 6 months (around 01/21/2017) for routine chronic illness f/u.  Signed:  Crissie Sickles, MD           07/24/2016

## 2017-01-22 ENCOUNTER — Ambulatory Visit: Payer: BC Managed Care – PPO | Admitting: Family Medicine

## 2017-05-16 ENCOUNTER — Ambulatory Visit: Payer: BC Managed Care – PPO | Admitting: Pulmonary Disease

## 2017-05-16 ENCOUNTER — Encounter: Payer: Self-pay | Admitting: Pulmonary Disease

## 2017-05-16 DIAGNOSIS — Z23 Encounter for immunization: Secondary | ICD-10-CM | POA: Diagnosis not present

## 2017-05-16 DIAGNOSIS — J449 Chronic obstructive pulmonary disease, unspecified: Secondary | ICD-10-CM | POA: Diagnosis not present

## 2017-05-16 DIAGNOSIS — M069 Rheumatoid arthritis, unspecified: Secondary | ICD-10-CM

## 2017-05-16 NOTE — Assessment & Plan Note (Signed)
Appears stable on Humira

## 2017-05-16 NOTE — Progress Notes (Signed)
   Subjective:    Patient ID: George Marshall, male    DOB: December 29, 1950, 66 y.o.   MRN: [id number]  HPI  66/M, ex smoker , quit 2002 for FU of Gold B COPD.  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 RUL segmental consolidation &RLL patchy infiltrate.   2012 -Spirometry moderate airway obstruction - FEv1 56%  07/2015 FEV1 52% 06/2016  FEV1  48%    Review of Systems Patient denies significant dyspnea,cough, hemoptysis,  chest pain, palpitations, pedal edema, orthopnea, paroxysmal nocturnal dyspnea, lightheadedness, nausea, vomiting, abdominal or  leg pains      Objective:   Physical Exam  Gen. Pleasant, well-nourished, in no distress ENT - no thrush, no post nasal drip Neck: No JVD, no thyromegaly, no carotid bruits Lungs: no use of accessory muscles, no dullness to percussion, clear without rales or rhonchi  Cardiovascular: Rhythm regular, heart sounds  normal, no murmurs or gallops, no peripheral edema Musculoskeletal: No deformities, no cyanosis or clubbing        Assessment & Plan:

## 2017-05-16 NOTE — Assessment & Plan Note (Signed)
FLu shot COPD appears stable Call if you develop signs of bronchitis  Clearance letter will be given for [facility] physical, lung function appears preserved last was at 48%

## 2017-05-16 NOTE — Patient Instructions (Signed)
FLu shot COPD appears stable Call if you develop signs of bronchitis

## 2017-05-19 ENCOUNTER — Encounter: Payer: Self-pay | Admitting: Family Medicine

## 2017-09-10 ENCOUNTER — Ambulatory Visit: Payer: BC Managed Care – PPO | Admitting: Family Medicine

## 2017-09-10 ENCOUNTER — Encounter: Payer: Self-pay | Admitting: Family Medicine

## 2017-09-10 VITALS — BP 145/83 | HR 100 | Temp 98.1°F | Resp 18 | Ht 71.0 in | Wt 147.0 lb

## 2017-09-10 DIAGNOSIS — J0100 Acute maxillary sinusitis, unspecified: Secondary | ICD-10-CM

## 2017-09-10 MED ORDER — DOXYCYCLINE HYCLATE 100 MG PO TABS: 100.0000 mg | ORAL_TABLET | Freq: Two times a day (BID) | ORAL | 0 refills | Status: DC

## 2017-09-10 NOTE — Patient Instructions (Signed)
Rest, hydrate.  + flonase, mucinex (DM if cough) Doxycyline   prescribed, take until completed.  If cough present it can last up to 6-8 weeks.  F/U 2 weeks of not improved.    Sinusitis, Adult Sinusitis is soreness and inflammation of your sinuses. Sinuses are hollow spaces in the bones around your face. They are located:  Around your eyes.  In the middle of your forehead.  Behind your nose.  In your cheekbones.  Your sinuses and nasal passages are lined with a stringy fluid (mucus). Mucus normally drains out of your sinuses. When your nasal tissues get inflamed or swollen, the mucus can get trapped or blocked so air cannot flow through your sinuses. This lets bacteria, viruses, and funguses grow, and that leads to infection. Follow these instructions at home: Medicines  Take, use, or apply over-the-counter and prescription medicines only as told by your doctor. These may include nasal sprays.  If you were prescribed an antibiotic medicine, take it as told by your doctor. Do not stop taking the antibiotic even if you start to feel better. Hydrate and Humidify  Drink enough water to keep your pee (urine) clear or pale yellow.  Use a cool mist humidifier to keep the humidity level in your home above 50%.  Breathe in steam for 10-15 minutes, 3-4 times a day or as told by your doctor. You can do this in the bathroom while a hot shower is running.  Try not to spend time in cool or dry air. Rest  Rest as much as possible.  Sleep with your head raised (elevated).  Make sure to get enough sleep each night. General instructions  Put a warm, moist washcloth on your face 3-4 times a day or as told by your doctor. This will help with discomfort.  Wash your hands often with soap and water. If there is no soap and water, use hand sanitizer.  Do not smoke. Avoid being around people who are smoking (secondhand smoke).  Keep all follow-up visits as told by your doctor. This is  important. Contact a doctor if:  You have a fever.  Your symptoms get worse.  Your symptoms do not get better within 10 days. Get help right away if:  You have a very bad headache.  You cannot stop throwing up (vomiting).  You have pain or swelling around your face or eyes.  You have trouble seeing.  You feel confused.  Your neck is stiff.  You have trouble breathing. This information is not intended to replace advice given to you by your health care provider. Make sure you discuss any questions you have with your health care provider. Document Released: 11/21/2007 Document Revised: 01/29/2016 Document Reviewed: 03/30/2015 Elsevier Interactive Patient Education  [employer].

## 2017-09-10 NOTE — Progress Notes (Signed)
BOSTEN NEWSTROM , 1951-04-30, 67 y.o., male MRN: [id number] Patient Care Team    Relationship Specialty Notifications Start End  [name], Adrian Blackwater, MD PCP - General Family Medicine  11/15/10   Jovita Kussmaul, MD Consulting Physician General Surgery  09/12/13   Eston Esters, MD (Inactive) Consulting Physician Hematology and Oncology  09/12/13   Arta Silence, MD Consulting Physician Gastroenterology  09/12/13   Silverio Decamp, MD Consulting Physician Sports Medicine  07/21/14   Hennie Duos, MD Consulting Physician Rheumatology  07/28/14   Rigoberto Noel, MD Consulting Physician Pulmonary Disease  07/29/15     Chief Complaint  Patient presents with  . URI    cough,congestion,drainage x 5days     Subjective: Pt presents for an OV with complaints of cough, congestion and nasal drainage of least 5 days duration.  Associated symptoms include headache, sinus pressure and ear fullness. Patient is prescribed Humira for his rheumatoid arthritis.  A history of COPD but reports he does not need daily inhalers.  He denies any current fevers, chills, nausea, vomiting or shortness of breath.  Feels most of it is in his sinuses. Pt has tried NyQuil to ease their symptoms.   No flowsheet data found.  No Known Allergies Social History   Tobacco Use  . Smoking status: Former Smoker    Packs/day: 1.50    Years: 30.00    Pack years: 45.00    Types: Cigarettes    Last attempt to quit: 06/18/1998    Years since quitting: 19.2  . Smokeless tobacco: Current User    Types: Chew  Substance Use Topics  . Alcohol use: No    Alcohol/week: 0.0 oz   Past Medical History:  Diagnosis Date  . B12 deficiency 11/08/2010  . Choledocholithiasis with acute cholangitis s/p ERCP 09/08/2013 09/12/2013   Dr Stacie Glaze GI   . Closed displaced fracture of shaft of fifth metacarpal bone of right hand 07/2014   Reduced/splinted by Sports Med MD.  . COPD (chronic obstructive pulmonary disease) ([place])    Dr.  Elsworth Soho (as of 07/2015 FEV1 stable at 52%)  F/u spirometry stable 06/2016 with Dr. Elsworth Soho.  Spiriva trial in the past--no help.  . Fracture of fifth toe, right, closed 09/26/2012  . Fracture of great toe, right, closed 09/2012   Nondisplaced.  Buddy taped, post-op shoe, ortho eval was done s/p initial dx in urgent care center.  Marland Kitchen GERD (gastroesophageal reflux disease)   . History of syncope 12/2004  . Hyperlipidemia   . Iron deficiency 11/08/2010  . Neutropenia 11/08/2010  . Pneumonia 10/20/2010   Initial dx 10/17/10--CXR and CT scan. F/u CT 11/06/10 showed resolving RUL and RML pneumonia and resolving reactive mediastinal LAD.    Marland Kitchen Rheumatoid arthritis(714.0) 10/2010   RF+, CCP+; managed well with Humira as of 05/2017 (Dr. Amil Amen)  . 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 CHOLECYSTECTOMY N/A 09/10/2013   Procedure: LAPAROSCOPIC CHOLECYSTECTOMY, OPEN CHOLECYSTECTOMY WITH INTRAOPERATIVE CHOLANGIOGRAM;  Surgeon: Merrie Roof, MD;  Location: [facility];  Service: General;  Laterality: N/A;  . ERCP N/A 09/08/2013   Procedure: ENDOSCOPIC RETROGRADE CHOLANGIOPANCREATOGRAPHY (ERCP);  Surgeon: Arta Silence, MD;  Location: [facility] ENDOSCOPY;  Service: Endoscopy;  Laterality: N/A;  . INGUINAL HERNIA REPAIR     age 89   Family History  Problem Relation Age of Onset  . Breast cancer Mother 52  . Ovarian cancer Mother 28  .  Skin cancer Mother   . Breast cancer Maternal Aunt        dx in her 44s  . Breast cancer Maternal Aunt 70  . Breast cancer Maternal Aunt 61  . Prostate cancer Cousin        double first cousin  . Hypertension Unknown        parents late 32's  . Breast cancer Paternal Aunt    Allergies as of 09/10/2017   No Known Allergies     Medication List        Accurate as of 09/10/17  3:54 PM. Always use your most recent med list.          adalimumab 40 MG/0.8ML injection Commonly known as:  HUMIRA PEN Inject 0.8 mLs (40 mg total)  into the skin every 14 (fourteen) days.       All past medical history, surgical history, allergies, family history, immunizations andmedications were updated in the EMR today and reviewed under the history and medication portions of their EMR.     ROS: Negative, with the exception of above mentioned in HPI   Objective:  BP (!) 145/83 (BP Location: Left Arm, Patient Position: Sitting, Cuff Size: Normal)   Pulse 100   Temp 98.1 F (36.7 C)   Resp 18   Ht 5\' 11"  (1.803 m)   Wt 147 lb (66.7 kg)   SpO2 96%   BMI 20.50 kg/m  Body mass index is 20.5 kg/m. Gen: Afebrile. No acute distress. Nontoxic in appearance, well developed, well nourished.  HENT: AT. [place]. Bilateral TM visualized without erythema or fullness. MMM, no oral lesions. Bilateral nares with erythema, swelling and drainage. Throat without erythema or exudates.  Nasal drip present.  Tender to palpation sinus present.  Mild cough present.  No hoarseness. Eyes:Pupils Equal Round Reactive to light, Extraocular movements intact,  Conjunctiva without redness, discharge or icterus. Neck/lymp/endocrine: Supple, no lymphadenopathy CV: RRR  Chest: CTAB, no wheeze or crackles. Good air movement, normal resp effort.  Skin: no rashes, purpura or petechiae.  Neuro:  Normal gait. PERLA. EOMi. Alert. Oriented x3  No exam data present No results found. No results found for this or any previous visit (from the past 24 hour(s)).  Assessment/Plan: DOMINIC MAHANEY is a 67 y.o. male present for OV for  Maxillary sinusitis: Rest, hydrate.  + flonase, mucinex (DM if cough)  Doxy  prescribed, take until completed.  If cough present it can last up to 6-8 weeks.  F/U 2 weeks of not improved.    Reviewed expectations re: course of current medical issues.  Discussed self-management of symptoms.  Outlined signs and symptoms indicating need for more acute intervention.  Patient verbalized understanding and all questions were  answered.  Patient received an After-Visit Summary.    No orders of the defined types were placed in this encounter.    Note is dictated utilizing voice recognition software. Although note has been proof read prior to signing, occasional typographical errors still can be missed. If any questions arise, please do not hesitate to call for verification.   electronically signed by:  Howard Pouch, DO  [place]

## 2017-09-11 ENCOUNTER — Encounter: Payer: Self-pay | Admitting: Family Medicine

## 2018-05-13 ENCOUNTER — Ambulatory Visit: Payer: BC Managed Care – PPO | Admitting: Pulmonary Disease

## 2018-05-29 ENCOUNTER — Ambulatory Visit (INDEPENDENT_AMBULATORY_CARE_PROVIDER_SITE_OTHER): Payer: Medicare HMO | Admitting: Pulmonary Disease

## 2018-05-29 ENCOUNTER — Encounter: Payer: Self-pay | Admitting: Pulmonary Disease

## 2018-05-29 DIAGNOSIS — M069 Rheumatoid arthritis, unspecified: Secondary | ICD-10-CM

## 2018-05-29 DIAGNOSIS — Z23 Encounter for immunization: Secondary | ICD-10-CM

## 2018-05-29 DIAGNOSIS — J432 Centrilobular emphysema: Secondary | ICD-10-CM | POA: Diagnosis not present

## 2018-05-29 NOTE — Assessment & Plan Note (Signed)
Stable on Humira

## 2018-05-29 NOTE — Assessment & Plan Note (Signed)
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 Cleared for working  Does not want breathing medications which is fine. Stays active Chest x-ray will be checked given prior abnormal

## 2018-05-29 NOTE — Progress Notes (Signed)
   Subjective:    Patient ID: George Marshall, male    DOB: 1950-10-23, 67 y.o.   MRN: [id number]  HPI  67 yo ex smoker , quit 2002 for FU of Gold B COPD.  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 Follow-up    36yr fu for COPD. States his breathing has been the same since he was last here.    Annual follow-up, breathing appears stable, no cough no interim flareups No pedal edema or orthopnea or chest pain Mother passed away last year  He cleared his physical, still needs a letter for work. Chest x-ray 06/2015 showed right midlung?  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 RUL segmental consolidation &RLL patchy infiltrate.   2012 -Spirometry moderate airway obstruction - FEv1 56%  07/2015 FEV1 52% 06/2016 FEV1  48%    Review of Systems Patient denies significant dyspnea,cough, hemoptysis,  chest pain, palpitations, pedal edema, orthopnea, paroxysmal nocturnal dyspnea, lightheadedness, nausea, vomiting, abdominal or  leg pains      Objective:   Physical Exam  Gen. Pleasant, well-nourished, in no distress ENT - no thrush, no post nasal drip Neck: No JVD, no thyromegaly, no carotid bruits Lungs: no use of accessory muscles, no dullness to percussion, clear without rales or rhonchi  Cardiovascular: Rhythm regular, heart sounds  normal, no murmurs or gallops, no peripheral edema Musculoskeletal: No deformities, no cyanosis or clubbing        Assessment & Plan:

## 2018-05-29 NOTE — Patient Instructions (Signed)
Chest x-ray at [facility] office Letter for work COPD appears stable

## 2018-06-02 ENCOUNTER — Ambulatory Visit (HOSPITAL_BASED_OUTPATIENT_CLINIC_OR_DEPARTMENT_OTHER)
Admission: RE | Admit: 2018-06-02 | Discharge: 2018-06-02 | Disposition: A | Discharge status: Home / Self Care | Payer: Medicare HMO | Source: Ambulatory Visit | Attending: Pulmonary Disease | Admitting: Pulmonary Disease

## 2018-06-02 DIAGNOSIS — J432 Centrilobular emphysema: Secondary | ICD-10-CM | POA: Insufficient documentation

## 2018-06-02 DIAGNOSIS — J439 Emphysema, unspecified: Secondary | ICD-10-CM | POA: Diagnosis not present

## 2018-06-04 ENCOUNTER — Telehealth: Payer: Self-pay | Admitting: Pulmonary Disease

## 2018-06-04 NOTE — Telephone Encounter (Signed)
Notes recorded by Rigoberto Noel, MD on 06/02/2018 at 3:39 PM EST Clear lungs  Called and spoke with pt letting him know the results of the cxr. Pt expressed understanding. Nothing further needed.

## 2018-06-19 DIAGNOSIS — M7989 Other specified soft tissue disorders: Secondary | ICD-10-CM | POA: Diagnosis not present

## 2018-06-19 DIAGNOSIS — M0589 Other rheumatoid arthritis with rheumatoid factor of multiple sites: Secondary | ICD-10-CM | POA: Diagnosis not present

## 2018-06-19 DIAGNOSIS — Z6820 Body mass index (BMI) 20.0-20.9, adult: Secondary | ICD-10-CM | POA: Diagnosis not present

## 2018-06-26 ENCOUNTER — Encounter: Payer: Self-pay | Admitting: Family Medicine

## 2018-07-07 ENCOUNTER — Encounter: Payer: Self-pay | Admitting: Family Medicine

## 2018-10-07 ENCOUNTER — Other Ambulatory Visit: Payer: Self-pay

## 2018-10-07 ENCOUNTER — Ambulatory Visit (INDEPENDENT_AMBULATORY_CARE_PROVIDER_SITE_OTHER): Payer: Medicare HMO | Admitting: Family Medicine

## 2018-10-07 ENCOUNTER — Encounter: Payer: Self-pay | Admitting: Family Medicine

## 2018-10-07 DIAGNOSIS — K529 Noninfective gastroenteritis and colitis, unspecified: Secondary | ICD-10-CM

## 2018-10-07 MED ORDER — PROMETHAZINE HCL 12.5 MG PO TABS: ORAL_TABLET | ORAL | 0 refills | Status: DC

## 2018-10-07 NOTE — Progress Notes (Signed)
Virtual Visit via Video Note  I connected with pt  on 10/07/18 at 11:20 AM EDT by a video enabled telemedicine application and verified that I am speaking with the correct person using two identifiers.  Location patient: home Location provider:work or home office Persons participating in the virtual visit: patient, provider  I discussed the limitations of evaluation and management by telemedicine and the availability of in person appointments. The patient expressed understanding and agreed to proceed.   HPI: [name] with rheumatoid arthritis (on humira) who I am seeing today for fever to 101, vomited x 3 this morning, some diarrhea last night before bed.  Started feeling tired about 36h ago, but no other sx's until onset of fever and diarrhea last night.  No blood or pus noted in stool.  No appetite.  No nasal sx's, no sT, no cough or SOB.  No abd pain. No myalgias or arthralgias. No known recent sick contacts. Next dose of humira will be due tomorrow night.   ROS: See pertinent positives and negatives per HPI.  Past Medical History:  Diagnosis Date  . B12 deficiency 11/08/2010  . Choledocholithiasis with acute cholangitis s/p ERCP 09/08/2013 09/12/2013   Dr Stacie Glaze GI   . Closed displaced fracture of shaft of fifth metacarpal bone of right hand 07/2014   Reduced/splinted by Sports Med MD.  . COPD (chronic obstructive pulmonary disease) ([place])    Dr. Elsworth Soho (as of 07/2015 FEV1 stable at 52%)  F/u spirometry stable 06/2016 with Dr. Elsworth Soho.  Spiriva trial in the past--no help.  Pt declines inhaler meds, functions fine w/out COPD meds as of 06/2018.  Marland Kitchen Fracture of fifth toe, right, closed 09/26/2012  . Fracture of great toe, right, closed 09/2012   Nondisplaced.  Buddy taped, post-op shoe, ortho eval was done s/p initial dx in urgent care center.  Marland Kitchen GERD (gastroesophageal reflux disease)   . History of syncope 12/2004  . Hyperlipidemia   . Iron deficiency 11/08/2010  . Neutropenia 11/08/2010   . Pneumonia 10/20/2010   Initial dx 10/17/10--CXR and CT scan. F/u CT 11/06/10 showed resolving RUL and RML pneumonia and resolving reactive mediastinal LAD.    Marland Kitchen Rheumatoid arthritis(714.0) 10/2010   RF+, CCP+; managed well with Humira as of 06/2018 (Dr. Amil Amen)  . 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 CHOLECYSTECTOMY N/A 09/10/2013   Procedure: LAPAROSCOPIC CHOLECYSTECTOMY, OPEN CHOLECYSTECTOMY WITH INTRAOPERATIVE CHOLANGIOGRAM;  Surgeon: Merrie Roof, MD;  Location: [facility];  Service: General;  Laterality: N/A;  . ERCP N/A 09/08/2013   Procedure: ENDOSCOPIC RETROGRADE CHOLANGIOPANCREATOGRAPHY (ERCP);  Surgeon: Arta Silence, MD;  Location: [facility] ENDOSCOPY;  Service: Endoscopy;  Laterality: N/A;  . INGUINAL HERNIA REPAIR     age 3    Family History  Problem Relation Age of Onset  . Breast cancer Mother 57  . Ovarian cancer Mother 46  . Skin cancer Mother   . Breast cancer Maternal Aunt        dx in her 3s  . Breast cancer Maternal Aunt 70  . Breast cancer Maternal Aunt 61  . Prostate cancer Cousin        double first cousin  . Hypertension Unknown        parents late 39's  . Breast cancer Paternal Aunt      Current Outpatient Medications:  .  adalimumab (HUMIRA PEN) 40 MG/0.8ML injection, Inject 0.8 mLs (40 mg total) into the skin  every 14 (fourteen) days., Disp: , Rfl: 0 .  promethazine (PHENERGAN) 12.5 MG tablet, 1-2 tabs po q6h prn nausea, Disp: 20 tablet, Rfl: 0  EXAM:  VITALS per patient if applicable: There were no vitals taken for this visit.   GENERAL: alert, oriented, appears well and in no acute distress.  He is smiling and joking with me.  HEENT: atraumatic, conjunttiva clear, no obvious abnormalities on inspection of external nose and ears  NECK: normal movements of the head and neck  LUNGS: on inspection no signs of respiratory distress, breathing rate appears normal, no obvious gross SOB,  gasping or wheezing  CV: no obvious cyanosis  MS: moves all visible extremities without noticeable abnormality  PSYCH/NEURO: pleasant and cooperative, no obvious depression or anxiety, speech and thought processing grossly intact  ASSESSMENT AND PLAN:  Discussed the following assessment and plan:  Acute viral gastroenteritis:  Symptomatic care, good attention to fluids emphasized. Gradually advance diet as tolerated. Phenergan 12.5mg , 1-2 q6h prn. Use imodium if having >3-4 diarrhea stools per day. Do not take next humira dose until feeling back to normal.   I discussed the assessment and treatment plan with the patient. The patient was provided an opportunity to ask questions and all were answered. The patient agreed with the plan and demonstrated an understanding of the instructions.   The patient was advised to call back or seek an in-person evaluation if the symptoms worsen or if the condition fails to improve as anticipated.  F/u: if not improving in 48 h or so  Signed:  Crissie Sickles, MD           10/07/2018

## 2018-12-25 DIAGNOSIS — M0589 Other rheumatoid arthritis with rheumatoid factor of multiple sites: Secondary | ICD-10-CM | POA: Diagnosis not present

## 2018-12-25 DIAGNOSIS — Z6820 Body mass index (BMI) 20.0-20.9, adult: Secondary | ICD-10-CM | POA: Diagnosis not present

## 2019-01-07 ENCOUNTER — Emergency Department (HOSPITAL_BASED_OUTPATIENT_CLINIC_OR_DEPARTMENT_OTHER)
Admission: EM | Admit: 2019-01-07 | Discharge: 2019-01-07 | Disposition: A | Discharge status: Home / Self Care | Payer: Medicare HMO | Attending: Emergency Medicine | Admitting: Emergency Medicine

## 2019-01-07 ENCOUNTER — Other Ambulatory Visit: Payer: Self-pay

## 2019-01-07 ENCOUNTER — Encounter (HOSPITAL_BASED_OUTPATIENT_CLINIC_OR_DEPARTMENT_OTHER): Payer: Self-pay | Admitting: Emergency Medicine

## 2019-01-07 DIAGNOSIS — K219 Gastro-esophageal reflux disease without esophagitis: Secondary | ICD-10-CM | POA: Diagnosis not present

## 2019-01-07 DIAGNOSIS — Z79899 Other long term (current) drug therapy: Secondary | ICD-10-CM | POA: Insufficient documentation

## 2019-01-07 DIAGNOSIS — E785 Hyperlipidemia, unspecified: Secondary | ICD-10-CM | POA: Insufficient documentation

## 2019-01-07 DIAGNOSIS — E876 Hypokalemia: Secondary | ICD-10-CM | POA: Insufficient documentation

## 2019-01-07 DIAGNOSIS — J449 Chronic obstructive pulmonary disease, unspecified: Secondary | ICD-10-CM | POA: Diagnosis not present

## 2019-01-07 DIAGNOSIS — R1013 Epigastric pain: Secondary | ICD-10-CM | POA: Insufficient documentation

## 2019-01-07 LAB — URINALYSIS, ROUTINE W REFLEX MICROSCOPIC
Bilirubin Urine: NEGATIVE
Glucose, UA: NEGATIVE mg/dL
Ketones, ur: NEGATIVE mg/dL
Leukocytes,Ua: NEGATIVE
Nitrite: NEGATIVE
Protein, ur: NEGATIVE mg/dL
Specific Gravity, Urine: 1.015 (ref 1.005–1.030)
pH: 7 (ref 5.0–8.0)

## 2019-01-07 LAB — COMPREHENSIVE METABOLIC PANEL
ALT: 23 U/L (ref 0–44)
AST: 31 U/L (ref 15–41)
Albumin: 4.2 g/dL (ref 3.5–5.0)
Alkaline Phosphatase: 75 U/L (ref 38–126)
Anion gap: 10 (ref 5–15)
BUN: 7 mg/dL — ABNORMAL LOW (ref 8–23)
CO2: 26 mmol/L (ref 22–32)
Calcium: 8.4 mg/dL — ABNORMAL LOW (ref 8.9–10.3)
Chloride: 100 mmol/L (ref 98–111)
Creatinine, Ser: 0.87 mg/dL (ref 0.61–1.24)
GFR calc Af Amer: >60 mL/min (ref >60)
GFR calc non Af Amer: >60 mL/min (ref >60)
Glucose, Bld: 106 mg/dL — ABNORMAL HIGH (ref 70–99)
Potassium: 3.3 mmol/L — ABNORMAL LOW (ref 3.5–5.1)
Sodium: 136 mmol/L (ref 135–145)
Total Bilirubin: 2 mg/dL — ABNORMAL HIGH (ref 0.3–1.2)
Total Protein: 7.2 g/dL (ref 6.5–8.1)

## 2019-01-07 LAB — CBC WITH DIFFERENTIAL/PLATELET
Abs Immature Granulocytes: 0.03 10*3/uL (ref 0.00–0.07)
Basophils Absolute: 0 10*3/uL (ref 0.0–0.1)
Basophils Relative: 0 %
Eosinophils Absolute: 0 10*3/uL (ref 0.0–0.5)
Eosinophils Relative: 0 %
HCT: 46.4 % (ref 39.0–52.0)
Hemoglobin: 16.4 g/dL (ref 13.0–17.0)
Immature Granulocytes: 0 %
Lymphocytes Relative: 12 %
Lymphs Abs: 0.9 10*3/uL (ref 0.7–4.0)
MCH: 30.6 pg (ref 26.0–34.0)
MCHC: 35.3 g/dL (ref 30.0–36.0)
MCV: 86.6 fL (ref 80.0–100.0)
Monocytes Absolute: 0.3 10*3/uL (ref 0.1–1.0)
Monocytes Relative: 4 %
Neutro Abs: 6.7 10*3/uL (ref 1.7–7.7)
Neutrophils Relative %: 84 %
Platelets: 176 10*3/uL (ref 150–400)
RBC: 5.36 MIL/uL (ref 4.22–5.81)
RDW: 12.7 % (ref 11.5–15.5)
WBC: 8 10*3/uL (ref 4.0–10.5)
nRBC: 0 % (ref 0.0–0.2)

## 2019-01-07 LAB — URINALYSIS, MICROSCOPIC (REFLEX)
Squamous Epithelial / HPF: NONE SEEN (ref 0–5)
WBC, UA: NONE SEEN WBC/hpf (ref 0–5)

## 2019-01-07 LAB — LIPASE, BLOOD: Lipase: 28 U/L (ref 11–51)

## 2019-01-07 MED ORDER — POTASSIUM CHLORIDE CRYS ER 20 MEQ PO TBCR
20.0000 meq | EXTENDED_RELEASE_TABLET | Freq: Once | ORAL | Status: AC
  Administered 2019-01-07: 20 meq via ORAL
  Filled 2019-01-07: qty 1

## 2019-01-07 MED ORDER — OMEPRAZOLE 20 MG PO CPDR: 20.0000 mg | DELAYED_RELEASE_CAPSULE | Freq: Every day | ORAL | 0 refills | Status: DC

## 2019-01-07 MED FILL — OMEPRAZOLE 20 MG CPDR: 20 | 30 days supply | Qty: 30 | Fill #0

## 2019-01-07 NOTE — Discharge Instructions (Signed)
You were seen in the ED today for pain to your abdomen Your labwork is very reassuring today Your potassium was mildly low today but we have given you medicine in the ED for it  The pain you are experiencing may be due to GERD. It appears you are not on any acid reflux medication. I have written you for a months worth. Please follow up with your PCP.   Return to the ED for any worsening symptoms including fever, vomiting blood, worsening pain, blood in stools

## 2019-01-07 NOTE — ED Triage Notes (Addendum)
Epigastric pain.  Pt sts he has been getting these pain since having his gall bladder out a few years ago and usually it comes and goes, but today it came and will not go.  Sts he took Tramadol at 11am and it did ease up but it came back.

## 2019-01-07 NOTE — ED Provider Notes (Signed)
[facility] EMERGENCY DEPARTMENT Provider Note   CSN: [id number] Arrival date & time: 01/07/19  1229    History   Chief Complaint Chief Complaint  Patient presents with  . Abdominal Pain    HPI George Marshall is a 68 y.o. male with PMHx COPD, GERD, RA, and choledocholithiasis with acute cholangitis s/pr ERCP 2015 who presents to the ED today complaining of sudden onset, constant, gradually improving, achy, epigastric abdominal pain x 5 hours. Pt reports that since he got his gallbladder taken out he has this similar pain maybe once every year. He states the pain typically subsides on its own but it did not this morning, causing him concern. He took Tramadol around 11 AM which eased the pain slightly but it returned again. Currently he states his pain has subsided while being in the ED. No suspicious food intake. No recent abx use. No alcohol use. Denies fever, chills, nausea, vomiting, hematemesis, diarrhea, constipation, hematochezia, melena, urinary sx, or any other associated symptoms.       Past Medical History:  Diagnosis Date  . B12 deficiency 11/08/2010  . Choledocholithiasis with acute cholangitis s/p ERCP 09/08/2013 09/12/2013   Dr Stacie Glaze GI   . Closed displaced fracture of shaft of fifth metacarpal bone of right hand 07/2014   Reduced/splinted by Sports Med MD.  . COPD (chronic obstructive pulmonary disease) ([place])    Dr. Elsworth Soho (as of 07/2015 FEV1 stable at 52%)  F/u spirometry stable 06/2016 with Dr. Elsworth Soho.  Spiriva trial in the past--no help.  Pt declines inhaler meds, functions fine w/out COPD meds as of 06/2018.  Marland Kitchen Fracture of fifth toe, right, closed 09/26/2012  . Fracture of great toe, right, closed 09/2012   Nondisplaced.  Buddy taped, post-op shoe, ortho eval was done s/p initial dx in urgent care center.  Marland Kitchen GERD (gastroesophageal reflux disease)   . History of syncope 12/2004  . Hyperlipidemia   . Iron deficiency 11/08/2010  . Neutropenia 11/08/2010  .  Pneumonia 10/20/2010   Initial dx 10/17/10--CXR and CT scan. F/u CT 11/06/10 showed resolving RUL and RML pneumonia and resolving reactive mediastinal LAD.    Marland Kitchen Rheumatoid arthritis(714.0) 10/2010   RF+, CCP+; managed well with Humira as of 06/2018 (Dr. Amil Amen)  . White coat syndrome without hypertension 2018    Patient Active Problem List   Diagnosis Date Noted  . Closed fracture of fifth metacarpal bone of right hand 07/20/2014  . Acute cholecystitis s/p lap/open cholecystectomy 09/10/2013 09/07/2013  . Rheumatoid arthritis ([place]) 12/27/2010  . Anemia 12/27/2010  . Hyperglycemia 12/27/2010  . COPD (chronic obstructive pulmonary disease) ([place]) 11/21/2010  . Neutropenia ([place]) 11/08/2010  . B12 deficiency 11/08/2010  . Iron deficiency 11/08/2010  . Sinusitis 09/26/2010  . GERD 05/29/2007  . HYPERLIPIDEMIA 05/23/2007    Past Surgical History:  Procedure Laterality Date  . CHOLECYSTECTOMY N/A 09/10/2013   Procedure: LAPAROSCOPIC CHOLECYSTECTOMY, OPEN CHOLECYSTECTOMY WITH INTRAOPERATIVE CHOLANGIOGRAM;  Surgeon: Merrie Roof, MD;  Location: [facility] ORS;  Service: General;  Laterality: N/A;  . ERCP N/A 09/08/2013   Procedure: ENDOSCOPIC RETROGRADE CHOLANGIOPANCREATOGRAPHY (ERCP);  Surgeon: Arta Silence, MD;  Location: [facility] END[facility]COPY;  Service: Endoscopy;  Laterality: N/A;  . INGUINAL HERNIA REPAIR     age 44        Home Medications    Prior to Admission medications   Medication Sig Start Date End Date Taking? Authorizing Provider  adalimumab (HUMIRA PEN) 40 MG/0.8ML injection Inject 0.8 mLs (40 mg total) into the  skin every 14 (fourteen) days. 09/14/13   Nat Christen, PA-C  omeprazole (PRILOSEC) 20 MG capsule Take 1 capsule (20 mg total) by mouth daily. 01/07/19   Eustaquio Maize, PA-C  promethazine (PHENERGAN) 12.5 MG tablet 1-2 tabs po q6h prn nausea 10/07/18   [name], Adrian Blackwater, MD    Family History Family History  Problem Relation Age of Onset  . Breast cancer Mother 79  . Ovarian  cancer Mother 81  . Skin cancer Mother   . Breast cancer Maternal Aunt        dx in her 78s  . Breast cancer Maternal Aunt 70  . Breast cancer Maternal Aunt 61  . Prostate cancer Cousin        double first cousin  . Hypertension Other        parents late 28's  . Breast cancer Paternal Aunt     Social History Social History   Tobacco Use  . Smoking status: Former Smoker    Packs/day: 1.50    Years: 30.00    Pack years: 45.00    Types: Cigarettes    Quit date: 06/18/1998    Years since quitting: 20.5  . Smokeless tobacco: Current User    Types: Chew  Substance Use Topics  . Alcohol use: No    Alcohol/week: 0.0 standard drinks  . Drug use: No     Allergies   Patient has no known allergies.   Review of Systems Review of Systems  Constitutional: Negative for chills and fever.  HENT: Negative for congestion.   Eyes: Negative for visual disturbance.  Respiratory: Negative for cough and shortness of breath.   Cardiovascular: Negative for chest pain.  Gastrointestinal: Positive for abdominal pain. Negative for blood in stool, constipation, diarrhea, nausea and vomiting.  Genitourinary: Negative for dysuria and frequency.  Musculoskeletal: Negative for myalgias.  Skin: Negative for rash.  Neurological: Negative for headaches.     Physical Exam Updated Vital Signs BP (!) 191/86 (BP Location: Right Arm)   Pulse 63   Temp 97.9 F (36.6 C)   Resp 16   Ht 5\' 11"  (1.803 m)   Wt 67.5 kg   SpO2 100%   BMI 20.75 kg/m   Physical Exam Vitals signs and nursing note reviewed.  Constitutional:      Appearance: He is not ill-appearing.  HENT:     Head: Normocephalic and atraumatic.  Eyes:     Conjunctiva/sclera: Conjunctivae normal.  Neck:     Musculoskeletal: Neck supple.  Cardiovascular:     Rate and Rhythm: Normal rate and regular rhythm.  Pulmonary:     Effort: Pulmonary effort is normal.     Breath sounds: Normal breath sounds.  Abdominal:     General: Bowel  sounds are normal.     Palpations: Abdomen is soft.     Tenderness: There is no abdominal tenderness. There is no right CVA tenderness, left CVA tenderness, guarding or rebound.     Comments: Soft, no tenderness with palpation diffusely although pt endorses "discomfort" to epigastrium region, +BS throughout, no r/g/r, neg murphy's, neg mcburney's, no CVA TTP   Skin:    General: Skin is warm and dry.  Neurological:     Mental Status: He is alert.      ED Treatments / Results  Labs (all labs ordered are listed, but only abnormal results are displayed) Labs Reviewed  COMPREHENSIVE METABOLIC PANEL - Abnormal; Notable for the following components:      Result Value  Potassium 3.3 (*)    Glucose, Bld 106 (*)    BUN 7 (*)    Calcium 8.4 (*)    Total Bilirubin 2.0 (*)    All other components within normal limits  URINALYSIS, ROUTINE W REFLEX MICROSCOPIC - Abnormal; Notable for the following components:   Hgb urine dipstick SMALL (*)    All other components within normal limits  URINALYSIS, MICROSCOPIC (REFLEX) - Abnormal; Notable for the following components:   Bacteria, UA RARE (*)    All other components within normal limits  LIPASE, BLOOD  CBC WITH DIFFERENTIAL/PLATELET    EKG EKG Interpretation  Date/Time:  Wednesday January 07 2019 14:49:18 EDT Ventricular Rate:  55 PR Interval:    QRS Duration: 92 QT Interval:  454 QTC Calculation: 435 R Axis:   74 Text Interpretation:  Sinus rhythm Confirmed by Gerlene Fee [phone]) on 01/07/2019 2:52:13 PM   Radiology No results found.  Procedures Procedures (including critical care time)  Medications Ordered in ED Medications  potassium chloride SA (K-DUR) CR tablet 20 mEq (20 mEq Oral Given 01/07/19 1425)     Initial Impression / Assessment and Plan / ED Course  I have reviewed the triage vital signs and the nursing notes.  Pertinent labs & imaging results that were available during my care of the patient were reviewed by  me and considered in my medical decision making (see chart for details).  Clinical Course as of Jan 06 2138  Wed Jan 07, 2019  1413 BP(!): 191/86 [MV]  1413 BP(!): 164/77 [MV]  3295 Will replete in ED  Potassium(!): 3.3 [MV]    Clinical Course User Index [MV] Eustaquio Maize, PA-C   67 year old male presenting to the ED today with epigastric pain. Hx of same s/p cholecystectomy in 2015. Pain has currently subsided. No other complaints including N/V/D. Pt without tenderness on exam. Low suspicion for acute abdomen today.  Will obtain basic bloodwork today. Do not feel he needs IVF at this time given no complaints of volume depletion. Will reevaluate once labs and imaging return.   Pt mildly hypokalemia at 3.3; repleted in the ED today. Remainder of bloodwork reassuring today. EKG without ischemic changes. Pt has remained pain free while in the ED today. He reports hx of GERD but is not on a PPI or H2 blocker at this time. Will give him months worth at this time as this could be causing pt's pain. Will have him follow up with his PCP regarding this. He is in agreement with plan at this time and stable for discharge home.         Final Clinical Impressions(s) / ED Diagnoses   Final diagnoses:  Epigastric pain  Hypokalemia    ED Discharge Orders         Ordered    omeprazole (PRILOSEC) 20 MG capsule  Daily     01/07/19 1508           Eustaquio Maize, PA-C 01/07/19 2140    Maudie Flakes, MD 01/08/19 [phone]

## 2019-01-07 NOTE — ED Notes (Signed)
ED Provider at bedside. 

## 2019-05-21 ENCOUNTER — Encounter: Payer: Self-pay | Admitting: Pulmonary Disease

## 2019-05-21 ENCOUNTER — Ambulatory Visit (INDEPENDENT_AMBULATORY_CARE_PROVIDER_SITE_OTHER): Payer: Medicare HMO | Admitting: Pulmonary Disease

## 2019-05-21 ENCOUNTER — Other Ambulatory Visit: Payer: Self-pay

## 2019-05-21 DIAGNOSIS — M05742 Rheumatoid arthritis with rheumatoid factor of left hand without organ or systems involvement: Secondary | ICD-10-CM

## 2019-05-21 DIAGNOSIS — M05741 Rheumatoid arthritis with rheumatoid factor of right hand without organ or systems involvement: Secondary | ICD-10-CM | POA: Diagnosis not present

## 2019-05-21 DIAGNOSIS — J432 Centrilobular emphysema: Secondary | ICD-10-CM | POA: Diagnosis not present

## 2019-05-21 NOTE — Assessment & Plan Note (Signed)
Lung function appears stable clinically.  He has not needed bronchodilators so far and will continue to stay off medications until needed No interim exacerbations. He would be at high risk should he get a Covid illness and definitely I would advise him to get the Covid vaccine. He has not obtained his flu shot yet I encouraged him to get this

## 2019-05-21 NOTE — Progress Notes (Signed)
I connected with  [name] on 05/21/19 by phone and verified that I am speaking with the correct person using two identifiers.   I discussed the limitations of evaluation and management by telemedicine. The patient expressed understanding and agreed to proceed.     68 yo ex smoker , [id number] for FU of Gold B COPD.  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  He is able to use the mask during training as needed.  No sick contacts.  He is social distancing and masking appropriately I did not have a spirometry on his yearly physical last year. Chest x-ray from 05/2018 was reviewed which appears clear No plans of retirement yet  He has been keeping up with his Humira shots  Significant tests/ events reviewed  CT chest 10/2010>. RUL segmental consolidation &RLL patchy infiltrate.   2012 -Spirometry moderate airway obstruction - FEv1 56%  07/2015 FEV1 52% 1/2018FEV1 48%    Impression and plan per problem list  Rakesh V. Elsworth Soho MD

## 2019-05-21 NOTE — Patient Instructions (Signed)
Please take your flu shot You must take the Covid vaccine when it comes out Let me know if you need a letter for work

## 2019-05-21 NOTE — Assessment & Plan Note (Signed)
Appears stable, no evidence of lung involvement

## 2019-05-26 ENCOUNTER — Other Ambulatory Visit: Payer: Self-pay

## 2019-05-27 ENCOUNTER — Ambulatory Visit (INDEPENDENT_AMBULATORY_CARE_PROVIDER_SITE_OTHER): Payer: Medicare HMO | Admitting: Family Medicine

## 2019-05-27 ENCOUNTER — Encounter: Payer: Self-pay | Admitting: Family Medicine

## 2019-05-27 ENCOUNTER — Other Ambulatory Visit: Payer: Self-pay

## 2019-05-27 VITALS — BP 156/88 | HR 90 | Temp 98.6°F | Resp 16 | Ht 71.0 in | Wt 143.0 lb

## 2019-05-27 DIAGNOSIS — H6123 Impacted cerumen, bilateral: Secondary | ICD-10-CM | POA: Diagnosis not present

## 2019-05-27 DIAGNOSIS — Z23 Encounter for immunization: Secondary | ICD-10-CM | POA: Diagnosis not present

## 2019-05-27 DIAGNOSIS — R030 Elevated blood-pressure reading, without diagnosis of hypertension: Secondary | ICD-10-CM | POA: Diagnosis not present

## 2019-05-27 NOTE — Addendum Note (Signed)
Addended by: Ralph Dowdy on: 05/27/2019 01:18 PM   Modules accepted: Orders

## 2019-05-27 NOTE — Progress Notes (Signed)
OFFICE VISIT  05/27/2019   CC:  Chief Complaint  Patient presents with  . Left ear clogged   HPI:    Patient is a 68 y.o. Caucasian male who presents for left ear feeling stopped up. Onset 6 mo ago or so, has gotten to the point of barely being able to hear out of it. Nothing draining.  No persistent pain.   Past Medical History:  Diagnosis Date  . B12 deficiency 11/08/2010  . Choledocholithiasis with acute cholangitis s/p ERCP 09/08/2013 09/12/2013   Dr Stacie Glaze GI   . Closed displaced fracture of shaft of fifth metacarpal bone of right hand 07/2014   Reduced/splinted by Sports Med MD.  . COPD (chronic obstructive pulmonary disease) ([place])    Dr. Elsworth Soho (as of 07/2015 FEV1 stable at 52%)  F/u spirometry stable 06/2016 with Dr. Elsworth Soho.  Spiriva trial in the past--no help.  Pt declines inhaler meds, functions fine w/out COPD meds as of 06/2018.  Marland Kitchen Fracture of fifth toe, right, closed 09/26/2012  . Fracture of great toe, right, closed 09/2012   Nondisplaced.  Buddy taped, post-op shoe, ortho eval was done s/p initial dx in urgent care center.  Marland Kitchen GERD (gastroesophageal reflux disease)   . History of syncope 12/2004  . Hyperlipidemia   . Iron deficiency 11/08/2010  . Neutropenia 11/08/2010  . Pneumonia 10/20/2010   Initial dx 10/17/10--CXR and CT scan. F/u CT 11/06/10 showed resolving RUL and RML pneumonia and resolving reactive mediastinal LAD.    Marland Kitchen Rheumatoid arthritis(714.0) 10/2010   RF+, CCP+; managed well with Humira as of 06/2018 (Dr. Amil Amen)  . 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 CHOLECYSTECTOMY N/A 09/10/2013   Procedure: LAPAROSCOPIC CHOLECYSTECTOMY, OPEN CHOLECYSTECTOMY WITH INTRAOPERATIVE CHOLANGIOGRAM;  Surgeon: Merrie Roof, MD;  Location: [facility];  Service: General;  Laterality: N/A;  . ERCP N/A 09/08/2013   Procedure: ENDOSCOPIC RETROGRADE CHOLANGIOPANCREATOGRAPHY (ERCP);  Surgeon: Arta Silence, MD;   Location: [facility] ENDOSCOPY;  Service: Endoscopy;  Laterality: N/A;  . INGUINAL HERNIA REPAIR     age 84    Outpatient Medications Prior to Visit  Medication Sig Dispense Refill  . adalimumab (HUMIRA PEN) 40 MG/0.8ML injection Inject 0.8 mLs (40 mg total) into the skin every 14 (fourteen) days.  0   No facility-administered medications prior to visit.     No Known Allergies  ROS As per HPI  PE: Blood pressure (!) 156/88, pulse 90, temperature 98.6 F (37 C), temperature source Temporal, resp. rate 16, height 5\' 11"  (1.803 m), weight 143 lb (64.9 kg), SpO2 99 %. Gen: Alert, well appearing.  Patient is oriented to person, place, time, and situation. AFFECT: pleasant, lucid thought and speech. EARS: both EACs completely 100% blocked by cerumen. Irrigated by nurse today:  LABS:    Chemistry      Component Value Date/Time   NA 136 01/07/2019 1336   K 3.3 (L) 01/07/2019 1336   CL 100 01/07/2019 1336   CO2 26 01/07/2019 1336   BUN 7 (L) 01/07/2019 1336   CREATININE 0.87 01/07/2019 1336   CREATININE 0.87 03/08/2012 1332      Component Value Date/Time   CALCIUM 8.4 (L) 01/07/2019 1336   ALKPHOS 75 01/07/2019 1336   AST 31 01/07/2019 1336   ALT 23 01/07/2019 1336   BILITOT 2.0 (H) 01/07/2019 1336      IMPRESSION AND PLAN:  Bilateral cerumen impaction: irrigated by nurse today->cleared large amount  of cerumen and pt felt significant improvement, but some cerumen was still present, deep, blocking view of TM on each side. Curette attempted but too uncomfortable for pt. Watchful waiting approach.  Elevated bp w/out dx of HTN: home bp near goal of 130/80 consistently-->white coat HTN. Continue to monitor at home a couple days a week, call if consistently >140/90.  An After Visit Summary was printed and given to the patient.  FOLLOW UP: Return if symptoms worsen or fail to improve.  Signed:  Crissie Sickles, MD           05/27/2019

## 2019-06-30 DIAGNOSIS — Z6820 Body mass index (BMI) 20.0-20.9, adult: Secondary | ICD-10-CM | POA: Diagnosis not present

## 2019-06-30 DIAGNOSIS — M0589 Other rheumatoid arthritis with rheumatoid factor of multiple sites: Secondary | ICD-10-CM | POA: Diagnosis not present

## 2019-07-03 DIAGNOSIS — M0589 Other rheumatoid arthritis with rheumatoid factor of multiple sites: Secondary | ICD-10-CM | POA: Diagnosis not present

## 2019-07-20 DIAGNOSIS — E538 Deficiency of other specified B group vitamins: Secondary | ICD-10-CM

## 2019-07-20 HISTORY — DX: Deficiency of other specified B group vitamins: E53.8

## 2019-08-13 ENCOUNTER — Telehealth: Payer: Self-pay

## 2019-08-13 NOTE — Telephone Encounter (Signed)
Patient has been experiencing tingling in his fingers/toes on both sides for 1 week. Patient thinks it might be related to his blood pressure. Patient is requesting an in person visit. No appointments are available until 08/17/19. Please advise.

## 2019-08-13 NOTE — Telephone Encounter (Signed)
Per Dr Anitra Lauth, okay to wait until 03/01. Please schedule office visit. Thank you

## 2019-08-13 NOTE — Telephone Encounter (Signed)
Please advise if okay to wait till 08/17/19 or would you like to see him sooner?

## 2019-08-13 NOTE — Telephone Encounter (Signed)
Ok for appt 3/1

## 2019-08-18 NOTE — Telephone Encounter (Signed)
L/M for patient to call back to schedule an appt

## 2019-08-19 NOTE — Telephone Encounter (Signed)
LM for pt to return call if appt still needed for tingling/blood pressure.

## 2019-08-20 NOTE — Telephone Encounter (Signed)
Patient has been contacted several times since 2/25 with no return call for an appt. If pt calls back, please schedule.

## 2019-08-21 ENCOUNTER — Other Ambulatory Visit: Payer: Self-pay

## 2019-08-21 ENCOUNTER — Encounter: Payer: Self-pay | Admitting: Family Medicine

## 2019-08-21 ENCOUNTER — Ambulatory Visit (INDEPENDENT_AMBULATORY_CARE_PROVIDER_SITE_OTHER): Payer: Medicare HMO | Admitting: Family Medicine

## 2019-08-21 VITALS — BP 132/64 | HR 88 | Temp 98.0°F | Resp 16 | Ht 71.0 in | Wt 140.6 lb

## 2019-08-21 DIAGNOSIS — E538 Deficiency of other specified B group vitamins: Secondary | ICD-10-CM

## 2019-08-21 DIAGNOSIS — R202 Paresthesia of skin: Secondary | ICD-10-CM

## 2019-08-21 NOTE — Patient Instructions (Signed)
Buy otc vitamin B12 (also called cyanocobalamine) 1000 microgram tabs and take 1 per day.  Check your blood pressure and heart rate once daily (or at least 3 days per week) and write the numbers down for review with me in office in 1 month.  Goal blood pressure is <130 on top and <80 on bottom. If consistently >150 on top or >100 on bottom then call/return for recheck BEFORE 1 month.

## 2019-08-21 NOTE — Progress Notes (Signed)
OFFICE VISIT  08/21/2019   CC:  Chief Complaint  Patient presents with  . Tingling in fingers/toes    x1-2 months     HPI:    Patient is a 69 y.o. Caucasian male who presents for tingling in fingers and toes. Onset insidious a few months ago or so, getting more noticeable. Tips of fingers and toes feel tingly and decreased sensation.  No pain. Occ tingling/numb sensation on scalp and back of neck.  No swelling anywhere.   Energy level pretty good/stable.  No color changes in hands or feet.    Hx of B12 def, most recent lab 2012, says he never really took B12 supplement as directed.  ROS: no fevers, no CP, no SOB, no wheezing, no cough, no dizziness, no HAs, no rashes, no melena/hematochezia.  No polyuria or polydipsia.  No myalgias or arthralgias.  No joint swelling.   Past Medical History:  Diagnosis Date  . B12 deficiency 11/08/2010  . Choledocholithiasis with acute cholangitis s/p ERCP 09/08/2013 09/12/2013   Dr Stacie Glaze GI   . Closed displaced fracture of shaft of fifth metacarpal bone of right hand 07/2014   Reduced/splinted by Sports Med MD.  . COPD (chronic obstructive pulmonary disease) ([place])    Dr. Elsworth Soho (as of 07/2015 FEV1 stable at 52%)  F/u spirometry stable 06/2016 with Dr. Elsworth Soho.  Spiriva trial in the past--no help.  Pt declines inhaler meds, functions fine w/out COPD meds as of 06/2018.  Marland Kitchen Fracture of fifth toe, right, closed 09/26/2012  . Fracture of great toe, right, closed 09/2012   Nondisplaced.  Buddy taped, post-op shoe, ortho eval was done s/p initial dx in urgent care center.  Marland Kitchen GERD (gastroesophageal reflux disease)   . History of syncope 12/2004  . Hyperlipidemia   . Iron deficiency 11/08/2010  . Neutropenia 11/08/2010  . Pneumonia 10/20/2010   Initial dx 10/17/10--CXR and CT scan. F/u CT 11/06/10 showed resolving RUL and RML pneumonia and resolving reactive mediastinal LAD.    Marland Kitchen Rheumatoid arthritis(714.0) 10/2010   RF+, CCP+; managed well with Humira as of  06/2018 (Dr. Amil Amen)  . 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 CHOLECYSTECTOMY N/A 09/10/2013   Procedure: LAPAROSCOPIC CHOLECYSTECTOMY, OPEN CHOLECYSTECTOMY WITH INTRAOPERATIVE CHOLANGIOGRAM;  Surgeon: Merrie Roof, MD;  Location: [facility];  Service: General;  Laterality: N/A;  . ERCP N/A 09/08/2013   Procedure: ENDOSCOPIC RETROGRADE CHOLANGIOPANCREATOGRAPHY (ERCP);  Surgeon: Arta Silence, MD;  Location: [facility] ENDOSCOPY;  Service: Endoscopy;  Laterality: N/A;  . INGUINAL HERNIA REPAIR     age 46    Outpatient Medications Prior to Visit  Medication Sig Dispense Refill  . adalimumab (HUMIRA PEN) 40 MG/0.8ML injection Inject 0.8 mLs (40 mg total) into the skin every 14 (fourteen) days.  0  . HUMIRA PEN 40 MG/0.4ML PNKT      No facility-administered medications prior to visit.    No Known Allergies  ROS As per HPI  PE: 132/64 manual bp recheck Blood pressure 132/64, pulse 88, temperature 98 F (36.7 C), temperature source Temporal, resp. rate 16, height 5\' 11"  (1.803 m), weight 140 lb 9.6 oz (63.8 kg), SpO2 100 %. Gen: Alert, well appearing.  Patient is oriented to person, place, time, and situation. AFFECT: pleasant, lucid thought and speech. [id number]: no injection, icteris, swelling, or exudate.  EOMI, PERRLA. Mouth: lips without lesion/swelling.  Oral mucosa pink and moist. Oropharynx without erythema, exudate, or swelling.  CV:  RRR, no m/r/g.   LUNGS: CTA bilat, nonlabored resps, good aeration in all lung fields. EXT: no clubbing or cyanosis.  no edema.  Tinel's and phalens testing neg bilat.  No hand or feet muscle wasting.  Hand and feet strength normal.  LABS:  Lab Results  Component Value Date   TSH 0.704 03/08/2012   Lab Results  Component Value Date   WBC 8.0 01/07/2019   HGB 16.4 01/07/2019   HCT 46.4 01/07/2019   MCV 86.6 01/07/2019   PLT 176 01/07/2019   Lab Results  Component Value Date    CREATININE 0.87 01/07/2019   BUN 7 (L) 01/07/2019   NA 136 01/07/2019   K 3.3 (L) 01/07/2019   CL 100 01/07/2019   CO2 26 01/07/2019   Lab Results  Component Value Date   ALT 23 01/07/2019   AST 31 01/07/2019   ALKPHOS 75 01/07/2019   BILITOT 2.0 (H) 01/07/2019   Lab Results  Component Value Date   CHOL 161 05/08/2007   Lab Results  Component Value Date   HDL 32.0 (L) 05/08/2007   Lab Results  Component Value Date   LDLCALC 111 (H) 05/08/2007   Lab Results  Component Value Date   TRIG 88 05/08/2007   Lab Results  Component Value Date   CHOLHDL 5.0 CALC 05/08/2007   Lab Results  Component Value Date   PSA 0.68 05/08/2007   PSA 0.91 05/17/2006   Lab Results  Component Value Date   HGBA1C 4.8 12/27/2010   Lab Results  Component Value Date   VITAMINB12 170 (L) 10/27/2010   IMPRESSION AND PLAN:  1) Paresthesias tips of toes and fingers, hx of vit B12 def. Check CBC, CMET, HBA1c, TSH, methylmalonic acid, intrinsic factor ab. Buy otc vitamin B12 (also called cyanocobalamine) 1000 microgram tabs and take 1 per day.  2) Elevated bp w/out dx HTN:  Recheck manually in office today normal. Instructions: Check your blood pressure and heart rate once daily (or at least 3 days per week) and write the numbers down for review with me in office in 1 month.  Goal blood pressure is <130 on top and <80 on bottom. If consistently >150 on top or >100 on bottom then call/return for recheck BEFORE 1 month.  An After Visit Summary was printed and given to the patient.  FOLLOW UP: Return in about 4 weeks (around 09/18/2019) for f/u bp's.  Signed:  Crissie Sickles, MD           08/21/2019

## 2019-08-25 ENCOUNTER — Encounter: Payer: Self-pay | Admitting: Family Medicine

## 2019-08-25 LAB — COMPREHENSIVE METABOLIC PANEL
AG Ratio: 1.3 (calc) (ref 1.0–2.5)
ALT: 13 U/L (ref 9–46)
AST: 14 U/L (ref 10–35)
Albumin: 4 g/dL (ref 3.6–5.1)
Alkaline phosphatase (APISO): 77 U/L (ref 35–144)
BUN: 12 mg/dL (ref 7–25)
CO2: 30 mmol/L (ref 20–32)
Calcium: 9.1 mg/dL (ref 8.6–10.3)
Chloride: 101 mmol/L (ref 98–110)
Creat: 0.98 mg/dL (ref 0.70–1.25)
Globulin: 3 g/dL (calc) (ref 1.9–3.7)
Glucose, Bld: 97 mg/dL (ref 65–99)
Potassium: 3.5 mmol/L (ref 3.5–5.3)
Sodium: 140 mmol/L (ref 135–146)
Total Bilirubin: 1.6 mg/dL — ABNORMAL HIGH (ref 0.2–1.2)
Total Protein: 7 g/dL (ref 6.1–8.1)

## 2019-08-25 LAB — TEST AUTHORIZATION

## 2019-08-25 LAB — FOLATE: Folate: 4.1 ng/mL — ABNORMAL LOW

## 2019-08-25 LAB — VITAMIN B12: Vitamin B-12: 283 pg/mL (ref 200–1100)

## 2019-08-25 LAB — CBC WITH DIFFERENTIAL/PLATELET
Absolute Monocytes: 348 cells/uL (ref 200–950)
Basophils Absolute: 39 cells/uL (ref 0–200)
Basophils Relative: 0.8 %
Eosinophils Absolute: 108 cells/uL (ref 15–500)
Eosinophils Relative: 2.2 %
HCT: 47.8 % (ref 38.5–50.0)
Hemoglobin: 17.1 g/dL (ref 13.2–17.1)
Lymphs Abs: 1274 cells/uL (ref 850–3900)
MCH: 30.9 pg (ref 27.0–33.0)
MCHC: 35.8 g/dL (ref 32.0–36.0)
MCV: 86.3 fL (ref 80.0–100.0)
MPV: 10.2 fL (ref 7.5–12.5)
Monocytes Relative: 7.1 %
Neutro Abs: 3131 cells/uL (ref 1500–7800)
Neutrophils Relative %: 63.9 %
Platelets: 175 10*3/uL (ref 140–400)
RBC: 5.54 10*6/uL (ref 4.20–5.80)
RDW: 12.7 % (ref 11.0–15.0)
Total Lymphocyte: 26 %
WBC: 4.9 10*3/uL (ref 3.8–10.8)

## 2019-08-25 LAB — METHYLMALONIC ACID, SERUM: Methylmalonic Acid, Quant: 268 nmol/L (ref 87–318)

## 2019-08-25 LAB — HEMOGLOBIN A1C
Hgb A1c MFr Bld: 4.2 % of total Hgb (ref <5.7)
Mean Plasma Glucose: 74 (calc)
eAG (mmol/L): 4.1 (calc)

## 2019-08-25 LAB — INTRINSIC FACTOR ANTIBODIES: Intrinsic Factor: NEGATIVE

## 2019-08-25 LAB — TSH: TSH: 1.89 mIU/L (ref 0.40–4.50)

## 2019-09-16 ENCOUNTER — Other Ambulatory Visit: Payer: Self-pay

## 2019-09-17 ENCOUNTER — Ambulatory Visit (INDEPENDENT_AMBULATORY_CARE_PROVIDER_SITE_OTHER): Payer: Medicare HMO | Admitting: Family Medicine

## 2019-09-17 ENCOUNTER — Encounter: Payer: Self-pay | Admitting: Family Medicine

## 2019-09-17 VITALS — BP 160/87 | HR 89 | Temp 98.0°F | Resp 16 | Ht 71.0 in | Wt 142.0 lb

## 2019-09-17 DIAGNOSIS — R030 Elevated blood-pressure reading, without diagnosis of hypertension: Secondary | ICD-10-CM

## 2019-09-17 DIAGNOSIS — R202 Paresthesia of skin: Secondary | ICD-10-CM | POA: Diagnosis not present

## 2019-09-17 DIAGNOSIS — E538 Deficiency of other specified B group vitamins: Secondary | ICD-10-CM

## 2019-09-17 NOTE — Progress Notes (Signed)
OFFICE VISIT  09/17/2019   CC:  Chief Complaint  Patient presents with  . Follow-up    elevated BP, pt is fasting   HPI:    Patient is a 69 y.o. Caucasian male who presents for 1 mo f/u elevated bp.  A/P as of last visit: "1) Paresthesias tips of toes and fingers, hx of vit B12 def. Check CBC, CMET, HBA1c, TSH, methylmalonic acid, intrinsic factor ab. Buy otc vitamin B12 (also called cyanocobalamine) 1000 microgram tabs and take 1 per day.  2) Elevated bp w/out dx HTN:  Recheck manually in office today normal. Instructions: Check your blood pressure and heart rate once daily (or at least 3 days per week) and write the numbers down for review with me in office in 1 month.  Goal blood pressure is <130 on top and <80 on bottom. If consistently >150 on top or >100 on bottom then call/return for recheck BEFORE 1 month."  Interim hx: All labs last visit normal except folate was slightly low. I recommended he start 0.8 mg folate daily.  Feeling good, says tingling in toes and fingers has resolved. Home bp's reviewed: all good->avg 130/80, hr avg 70-80 range.  Past Medical History:  Diagnosis Date  . B12 deficiency 11/08/2010  . Choledocholithiasis with acute cholangitis s/p ERCP 09/08/2013 09/12/2013   Dr Stacie Glaze GI   . Closed displaced fracture of shaft of fifth metacarpal bone of right hand 07/2014   Reduced/splinted by Sports Med MD.  . COPD (chronic obstructive pulmonary disease) ([place])    Dr. Elsworth Soho (as of 07/2015 FEV1 stable at 52%)  F/u spirometry stable 06/2016 with Dr. Elsworth Soho.  Spiriva trial in the past--no help.  Pt declines inhaler meds, functions fine w/out COPD meds as of 06/2018.  Marland Kitchen Folate deficiency 07/2019   paresthesias  . Fracture of fifth toe, right, closed 09/26/2012  . Fracture of great toe, right, closed 09/2012   Nondisplaced.  Buddy taped, post-op shoe, ortho eval was done s/p initial dx in urgent care center.  Marland Kitchen GERD (gastroesophageal reflux disease)   . History  of syncope 12/2004  . Hyperlipidemia   . Iron deficiency 11/08/2010  . Neutropenia 11/08/2010  . Pneumonia 10/20/2010   Initial dx 10/17/10--CXR and CT scan. F/u CT 11/06/10 showed resolving RUL and RML pneumonia and resolving reactive mediastinal LAD.    Marland Kitchen Rheumatoid arthritis(714.0) 10/2010   RF+, CCP+; managed well with Humira as of 06/2018 (Dr. Amil Amen)  . 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 CHOLECYSTECTOMY N/A 09/10/2013   Procedure: LAPAROSCOPIC CHOLECYSTECTOMY, OPEN CHOLECYSTECTOMY WITH INTRAOPERATIVE CHOLANGIOGRAM;  Surgeon: Merrie Roof, MD;  Location: [facility];  Service: General;  Laterality: N/A;  . ERCP N/A 09/08/2013   Procedure: ENDOSCOPIC RETROGRADE CHOLANGIOPANCREATOGRAPHY (ERCP);  Surgeon: Arta Silence, MD;  Location: [facility] ENDOSCOPY;  Service: Endoscopy;  Laterality: N/A;  . INGUINAL HERNIA REPAIR     age 54    Outpatient Medications Prior to Visit  Medication Sig Dispense Refill  . adalimumab (HUMIRA PEN) 40 MG/0.8ML injection Inject 0.8 mLs (40 mg total) into the skin every 14 (fourteen) days.  0   No facility-administered medications prior to visit.    No Known Allergies  ROS As per HPI  PE: Blood pressure (!) 160/87, pulse 89, temperature 98 F (36.7 C), temperature source Temporal, resp. rate 16, height 5\' 11"  (1.803 m), weight 142 lb (64.4 kg), SpO2 99 %. Body mass index is  19.8 kg/m.  Gen: Alert, well appearing.  Patient is oriented to person, place, time, and situation. AFFECT: pleasant, lucid thought and speech. CV: RRR, no m/r/g.   LUNGS: CTA bilat, nonlabored resps, good aeration in all lung fields. EXT: no clubbing or cyanosis.  no edema.    LABS:  Lab Results  Component Value Date   TSH 1.89 08/21/2019   Lab Results  Component Value Date   WBC 4.9 08/21/2019   HGB 17.1 08/21/2019   HCT 47.8 08/21/2019   MCV 86.3 08/21/2019   PLT 175 08/21/2019   Lab Results  Component Value  Date   CREATININE 0.98 08/21/2019   BUN 12 08/21/2019   NA 140 08/21/2019   K 3.5 08/21/2019   CL 101 08/21/2019   CO2 30 08/21/2019   Lab Results  Component Value Date   ALT 13 08/21/2019   AST 14 08/21/2019   ALKPHOS 75 01/07/2019   BILITOT 1.6 (H) 08/21/2019   Lab Results  Component Value Date   CHOL 161 05/08/2007   Lab Results  Component Value Date   HDL 32.0 (L) 05/08/2007   Lab Results  Component Value Date   LDLCALC 111 (H) 05/08/2007   Lab Results  Component Value Date   TRIG 88 05/08/2007   Lab Results  Component Value Date   CHOLHDL 5.0 CALC 05/08/2007   Lab Results  Component Value Date   PSA 0.68 05/08/2007   PSA 0.91 05/17/2006   Lab Results  Component Value Date   VITAMINB12 283 08/21/2019   Lab Results  Component Value Date   HGBA1C 4.2 08/21/2019   Lab Results  Component Value Date   FOLATE 4.1 (L) 08/21/2019    IMPRESSION AND PLAN:  1) White coat HTN: confirmed again with normal home bp's. Reassured.  2) Paresthesias, suspected to be from folate deficiency. Resolved since getting on folate supplement 1 mo ago.  An After Visit Summary was printed and given to the patient.  FOLLOW UP: Return in about 6 months (around 03/18/2020) for annual CPE (fasting).  Signed:  Crissie Sickles, MD           09/17/2019

## 2019-12-31 DIAGNOSIS — Z681 Body mass index (BMI) 19 or less, adult: Secondary | ICD-10-CM | POA: Diagnosis not present

## 2019-12-31 DIAGNOSIS — M0589 Other rheumatoid arthritis with rheumatoid factor of multiple sites: Secondary | ICD-10-CM | POA: Diagnosis not present

## 2020-03-22 ENCOUNTER — Encounter: Payer: Self-pay | Admitting: Family Medicine

## 2020-03-22 ENCOUNTER — Ambulatory Visit (INDEPENDENT_AMBULATORY_CARE_PROVIDER_SITE_OTHER): Payer: Medicare HMO | Admitting: Family Medicine

## 2020-03-22 ENCOUNTER — Other Ambulatory Visit: Payer: Self-pay

## 2020-03-22 DIAGNOSIS — E538 Deficiency of other specified B group vitamins: Secondary | ICD-10-CM

## 2020-03-22 DIAGNOSIS — Z23 Encounter for immunization: Secondary | ICD-10-CM | POA: Diagnosis not present

## 2020-03-22 DIAGNOSIS — Z0000 Encounter for general adult medical examination without abnormal findings: Secondary | ICD-10-CM

## 2020-03-22 DIAGNOSIS — J069 Acute upper respiratory infection, unspecified: Secondary | ICD-10-CM | POA: Diagnosis not present

## 2020-03-22 MED ORDER — VITAMIN B-12 100 MCG PO TABS: 100.0000 ug | ORAL_TABLET | Freq: Every day | ORAL | 0 refills | Status: AC

## 2020-03-22 NOTE — Addendum Note (Signed)
Addended by: Deveron Furlong D on: 03/22/2020 09:44 AM   Modules accepted: Orders

## 2020-03-22 NOTE — Progress Notes (Signed)
Office Note 03/22/2020  CC:  Chief Complaint  Patient presents with  . Follow-up    RCI, pt is fasting    HPI:  George Marshall is a 69 y.o. White male who is here for f/u hx of vit B12 def, and hx of folate deficiency, discuss health maintenance. He has RF + RA, followed by Dr. Amil Amen and treated with humira. He has whitecoat syndrome w/out dx of HTN.   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 Gets this next in beginning of 05/2020--it will include PSA, chol, sugar, blood counts. Dr. Amil Amen monitors his cbc's and CMETs periodically as well.  Taking his folate 0.8 mg qd and b12 1000 mcg qd. No tingling/paresthesias anywhere.  Some nasal drainage and pnd coughing last few days, getting better last 24h or so. Nyquil last night, helps him sleep.  No pain in face or upper teeth.  No fever.  No HA. No wheezing or SOB.  No ear pain.  BP monitoring at home consistently <130/80.  Nothing close what it is here in office "ever".    Past Medical History:  Diagnosis Date  . B12 deficiency 11/08/2010  . Choledocholithiasis with acute cholangitis s/p ERCP 09/08/2013 09/12/2013   Dr Stacie Glaze GI   . Closed displaced fracture of shaft of fifth metacarpal bone of right hand 07/2014   Reduced/splinted by Sports Med MD.  . COPD (chronic obstructive pulmonary disease) ([place])    Dr. Elsworth Soho (as of 07/2015 FEV1 stable at 52%)  F/u spirometry stable 06/2016 with Dr. Elsworth Soho.  Spiriva trial in the past--no help.  Pt declines inhaler meds, functions fine w/out COPD meds as of 06/2018.  Marland Kitchen Folate deficiency 07/2019   paresthesias->folic acid supplement started-->paresthesias resolved.  . Fracture of fifth toe, right, closed 09/26/2012  . Fracture of great toe, right, closed 09/2012   Nondisplaced.  Buddy taped, post-op shoe, ortho eval was done s/p initial dx in urgent care center.  Marland Kitchen GERD (gastroesophageal reflux disease)   . History of syncope 12/2004  . Hyperlipidemia   .  Iron deficiency 11/08/2010  . Neutropenia 11/08/2010  . Pneumonia 10/20/2010   Initial dx 10/17/10--CXR and CT scan. F/u CT 11/06/10 showed resolving RUL and RML pneumonia and resolving reactive mediastinal LAD.    Marland Kitchen Rheumatoid arthritis(714.0) 10/2010   RF+, CCP+; managed well with Humira as of 06/2018 (Dr. Amil Amen)  . White coat syndrome without hypertension 2018   Reconfirmed 08/2019 with normal home bps    Past Surgical History:  Procedure Laterality Date  . CHOLECYSTECTOMY N/A 09/10/2013   Procedure: LAPAROSCOPIC CHOLECYSTECTOMY, OPEN CHOLECYSTECTOMY WITH INTRAOPERATIVE CHOLANGIOGRAM;  Surgeon: Merrie Roof, MD;  Location: [facility];  Service: General;  Laterality: N/A;  . ERCP N/A 09/08/2013   Procedure: ENDOSCOPIC RETROGRADE CHOLANGIOPANCREATOGRAPHY (ERCP);  Surgeon: Arta Silence, MD;  Location: [facility] ENDOSCOPY;  Service: Endoscopy;  Laterality: N/A;  . INGUINAL HERNIA REPAIR     age 34    Family History  Problem Relation Age of Onset  . Breast cancer Mother 51  . Ovarian cancer Mother 37  . Skin cancer Mother   . Breast cancer Maternal Aunt        dx in her 62s  . Breast cancer Maternal Aunt 70  . Breast cancer Maternal Aunt 61  . Prostate cancer Cousin        double first cousin  . Hypertension Other        parents late 51's  . Breast cancer  Paternal Aunt     Social History   Socioeconomic History  . Marital status: Divorced    Spouse name: Not on file  . Number of children: Not on file  . Years of education: Not on file  . Highest education level: Not on file  Occupational History  . Not on file  Tobacco Use  . Smoking status: Former Smoker    Packs/day: 1.50    Years: 30.00    Pack years: 45.00    Types: Cigarettes    Quit date: 06/18/1998    Years since quitting: 21.7  . Smokeless tobacco: Current User    Types: Chew  Substance and Sexual Activity  . Alcohol use: No    Alcohol/week: 0.0 standard drinks  . Drug use: No  . Sexual activity: Not on file  Other  Topics Concern  . Not on file  Social History Narrative   Divorced, 2 step children.   Has live-in girlfriend.   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   [place], orig from [place], [place].   Tob: 30 pack-yr hx.  Quit 2000.   No alcohol.  No drugs.   Volunteer with [employer].   Gardening/yard work.   Social Determinants of Health   Financial Resource Strain:   . Difficulty of Paying Living Expenses: Not on file  Food Insecurity:   . Worried About Charity fundraiser in the Last Year: Not on file  . Ran Out of Food in the Last Year: Not on file  Transportation Needs:   . Lack of Transportation (Medical): Not on file  . Lack of Transportation (Non-Medical): Not on file  Physical Activity:   . Days of Exercise per Week: Not on file  . Minutes of Exercise per Session: Not on file  Stress:   . Feeling of Stress : Not on file  Social Connections:   . Frequency of Communication with Friends and Family: Not on file  . Frequency of Social Gatherings with Friends and Family: Not on file  . Attends Religious Services: Not on file  . Active Member of Clubs or Organizations: Not on file  . Attends Archivist Meetings: Not on file  . Marital Status: Not on file  Intimate Partner Violence:   . Fear of Current or Ex-Partner: Not on file  . Emotionally Abused: Not on file  . Physically Abused: Not on file  . Sexually Abused: Not on file    Outpatient Medications Prior to Visit  Medication Sig Dispense Refill  . adalimumab (HUMIRA PEN) 40 MG/0.8ML injection Inject 0.8 mLs (40 mg total) into the skin every 14 (fourteen) days.  0  . Folic Acid (FOLATE PO) Take 0.4 mg by mouth in the morning and at bedtime.    . cyanocobalamin 100 MCG tablet Take 100 mcg by mouth daily.     No facility-administered medications prior to visit.    No Known Allergies  ROS Review of Systems  Constitutional: Negative for appetite change, chills, fatigue and fever.   HENT: Negative for congestion, dental problem, ear pain and sore throat.   Eyes: Negative for discharge, redness and visual disturbance.  Respiratory: Negative for cough, chest tightness, shortness of breath and wheezing.   Cardiovascular: Negative for chest pain, palpitations and leg swelling.  Gastrointestinal: Negative for abdominal pain, blood in stool, diarrhea, nausea and vomiting.  Genitourinary: Negative for difficulty urinating, dysuria, flank pain, frequency, hematuria and urgency.  Musculoskeletal: Negative for arthralgias,  back pain, joint swelling, myalgias and neck stiffness.  Skin: Negative for pallor and rash.  Neurological: Negative for dizziness, speech difficulty, weakness and headaches.  Hematological: Negative for adenopathy. Does not bruise/bleed easily.  Psychiatric/Behavioral: Negative for confusion and sleep disturbance. The patient is not nervous/anxious.     PE; Vitals with BMI 03/22/2020 09/17/2019 08/21/2019  Height 5\' 11"  5\' 11"  -  Weight 143 lbs 10 oz 142 lbs -  BMI 91.47 82.95 -  Systolic 621 308 657  Diastolic 88 87 64  Pulse 80 89 -     Gen: Alert, well appearing.  Patient is oriented to person, place, time, and situation. AFFECT: pleasant, lucid thought and speech. ENT: Ears: EACs full of cerumen bilat. Cannot visualize TMs. Eyes: no injection, icteris, swelling, or exudate.  EOMI, PERRLA. Nose: no drainage or turbinate edema/swelling.  No injection or focal lesion.  Mouth: lips without lesion/swelling.  Oral mucosa pink and moist.  Dentition intact and without obvious caries or gingival swelling.  Oropharynx without erythema, exudate, or swelling.  Neck - No masses or thyromegaly or limitation in range of motion CV: RRR, no m/r/g.   LUNGS: CTA bilat, nonlabored resps, good aeration in all lung fields. EXT: no clubbing or cyanosis.  no edema.   Pertinent labs:   Lab Results  Component Value Date   VITAMINB12 283 08/21/2019   Lab Results   Component Value Date   FOLATE 4.1 (L) 08/21/2019   Lab Results  Component Value Date   TSH 1.89 08/21/2019   Lab Results  Component Value Date   WBC 4.9 08/21/2019   HGB 17.1 08/21/2019   HCT 47.8 08/21/2019   MCV 86.3 08/21/2019   PLT 175 08/21/2019   Lab Results  Component Value Date   CREATININE 0.98 08/21/2019   BUN 12 08/21/2019   NA 140 08/21/2019   K 3.5 08/21/2019   CL 101 08/21/2019   CO2 30 08/21/2019   Lab Results  Component Value Date   ALT 13 08/21/2019   AST 14 08/21/2019   ALKPHOS 75 01/07/2019   BILITOT 1.6 (H) 08/21/2019   Lab Results  Component Value Date   CHOL 161 05/08/2007   Lab Results  Component Value Date   HDL 32.0 (L) 05/08/2007   Lab Results  Component Value Date   LDLCALC 111 (H) 05/08/2007   Lab Results  Component Value Date   TRIG 88 05/08/2007   Lab Results  Component Value Date   CHOLHDL 5.0 CALC 05/08/2007   Lab Results  Component Value Date   PSA 0.68 05/08/2007   PSA 0.91 05/17/2006   Lab Results  Component Value Date   HGBA1C 4.2 08/21/2019   ASSESSMENT AND PLAN:   1) Folate deficiency: all paresthesias gone since getting on 0.8 mg folate qd. Recheck folate level today.  2) Vitamin B12 def: taking 1000 mcg b12 daily. Recheck b12 level today.  3) Preventative health: Colon ca screening: has never had colonoscopy--pt defers this discussion for now. Pneumovax 23->declined.  Tdap->declines.  Flu->given today.  Shingrix->declined. FLP, CMET, CBC, PSA, etc all to be done via FD 05/2020, also gets some labs regularly followed by his rheum MD.  4) Viral URI: discussed symptom mgmt. Signs/symptoms to call or return for were reviewed and pt expressed understanding.  He'll bring copy of labs he gets 05/2020 to our office for review.  An After Visit Summary was printed and given to the patient.  FOLLOW UP:  Return in about 6 months (around  09/20/2020) for routine chronic illness f/u (discuss HM stuff--gets labs  via FD and Rheum).  Signed:  Crissie Sickles, MD           03/22/2020

## 2020-03-23 LAB — VITAMIN B12: Vitamin B-12: 757 pg/mL (ref 200–1100)

## 2020-03-23 LAB — FOLATE: Folate: 14.1 ng/mL

## 2020-05-24 ENCOUNTER — Ambulatory Visit (INDEPENDENT_AMBULATORY_CARE_PROVIDER_SITE_OTHER): Payer: Medicare HMO | Admitting: Pulmonary Disease

## 2020-05-24 ENCOUNTER — Encounter: Payer: Self-pay | Admitting: Pulmonary Disease

## 2020-05-24 ENCOUNTER — Other Ambulatory Visit: Payer: Self-pay

## 2020-05-24 DIAGNOSIS — M05741 Rheumatoid arthritis with rheumatoid factor of right hand without organ or systems involvement: Secondary | ICD-10-CM

## 2020-05-24 DIAGNOSIS — J432 Centrilobular emphysema: Secondary | ICD-10-CM

## 2020-05-24 DIAGNOSIS — M05742 Rheumatoid arthritis with rheumatoid factor of left hand without organ or systems involvement: Secondary | ICD-10-CM | POA: Diagnosis not present

## 2020-05-24 NOTE — Patient Instructions (Signed)
You have moderate COPD, this is stable Call as needed

## 2020-05-24 NOTE — Progress Notes (Signed)
   Subjective:    Patient ID: George Marshall, male    DOB: 1950-08-10, 69 y.o.   MRN: [id number]  HPI  63 yoex smoker , [id number] for FU of Gold B COPD.  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 No interim issues.  He has received Covid vaccines and flu shot is up-to-date, he gets a booster tomorrow. Breathing is stable, no cough or wheezing.  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  Arthritis is well controlled on Humira  He had his physical last week and cleared it  Significant tests/ events reviewed  CT chest 10/2010>. RUL segmental consolidation &RLL patchy infiltrate.   2012 -Spirometry moderate airway obstruction - FEv1 56%  07/2015 FEV1 52% 1/2018FEV1 48%    Review of Systems Patient denies significant dyspnea,cough, hemoptysis,  chest pain, palpitations, pedal edema, orthopnea, paroxysmal nocturnal dyspnea, lightheadedness, nausea, vomiting, abdominal or  leg pains      Objective:   Physical Exam  Gen. Pleasant, well-nourished, in no distress ENT - no thrush, no pallor/icterus,no post nasal drip Neck: No JVD, no thyromegaly, no carotid bruits Lungs: no use of accessory muscles, no dullness to percussion, decreased BL without rales or rhonchi  Cardiovascular: Rhythm regular, heart sounds  normal, no murmurs or gallops, no peripheral edema Musculoskeletal: No deformities, no cyanosis or clubbing        Assessment & Plan:

## 2020-05-24 NOTE — Assessment & Plan Note (Signed)
Moderate degree, with stable over the past few years.  He has not had flares. We have avoided bronchodilators due to the stable nature of his disease.  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

## 2020-05-24 NOTE — Assessment & Plan Note (Signed)
No evidence of rheumatoid lung, chest x-ray 2019 reviewed, clear

## 2020-06-30 DIAGNOSIS — Z6820 Body mass index (BMI) 20.0-20.9, adult: Secondary | ICD-10-CM | POA: Diagnosis not present

## 2020-06-30 DIAGNOSIS — M0589 Other rheumatoid arthritis with rheumatoid factor of multiple sites: Secondary | ICD-10-CM | POA: Diagnosis not present

## 2020-06-30 DIAGNOSIS — Z79899 Other long term (current) drug therapy: Secondary | ICD-10-CM | POA: Diagnosis not present

## 2020-07-06 DIAGNOSIS — L820 Inflamed seborrheic keratosis: Secondary | ICD-10-CM | POA: Diagnosis not present

## 2020-07-06 DIAGNOSIS — D485 Neoplasm of uncertain behavior of skin: Secondary | ICD-10-CM | POA: Diagnosis not present

## 2020-07-06 DIAGNOSIS — L821 Other seborrheic keratosis: Secondary | ICD-10-CM | POA: Diagnosis not present

## 2020-07-19 DIAGNOSIS — D485 Neoplasm of uncertain behavior of skin: Secondary | ICD-10-CM | POA: Diagnosis not present

## 2020-07-19 DIAGNOSIS — L989 Disorder of the skin and subcutaneous tissue, unspecified: Secondary | ICD-10-CM | POA: Diagnosis not present

## 2020-08-24 ENCOUNTER — Other Ambulatory Visit: Payer: Self-pay

## 2020-08-24 ENCOUNTER — Ambulatory Visit (INDEPENDENT_AMBULATORY_CARE_PROVIDER_SITE_OTHER): Payer: Medicare HMO

## 2020-08-24 VITALS — BP 150/98 | HR 89 | Temp 97.2°F | Resp 16 | Ht 71.0 in | Wt 145.1 lb

## 2020-08-24 DIAGNOSIS — Z0000 Encounter for general adult medical examination without abnormal findings: Secondary | ICD-10-CM

## 2020-08-24 NOTE — Progress Notes (Signed)
Subjective:   George Marshall is a 70 y.o. male who presents for an Initial Medicare Annual Wellness Visit.  Review of Systems     Cardiac Risk Factors include: advanced age (>20men, >80 women);male gender;dyslipidemia     Objective:    Today's Vitals   08/24/20 0940  BP: (!) 150/98  Pulse: 89  Resp: 16  Temp: (!) 97.2 F (36.2 C)  TempSrc: Temporal  SpO2: 97%  Weight: 145 lb 1.6 oz (65.8 kg)  Height: 5\' 11"  (1.803 m)  Patient states his B/P is always elevated in the doctor's office but is normal at home.  Body mass index is 20.24 kg/m.  Advanced Directives 08/24/2020 01/07/2019 07/28/2015 08/23/2014 09/08/2013 09/07/2013  Does Patient Have a Medical Advance Directive? Yes Yes No No - Patient does not have advance directive;Patient would not like information  Type of Scientist, forensic Power of [place];Living will - - - - -  Does patient want to make changes to medical advance directive? - No - Patient declined - - - -  Copy of [place] in Chart? No - copy requested - - - - -  Would patient like information on creating a medical advance directive? - - - No - patient declined information - -  Pre-existing out of facility DNR order (yellow form or pink MOST form) - - - - No -    Current Medications (verified) Outpatient Encounter Medications as of 08/24/2020  Medication Sig  . adalimumab (HUMIRA PEN) 40 MG/0.8ML injection Inject 0.8 mLs (40 mg total) into the skin every 14 (fourteen) days.  . Folic Acid (FOLATE PO) Take 0.4 mg by mouth in the morning and at bedtime.  . vitamin B-12 (CYANOCOBALAMIN) 100 MCG tablet Take 1 tablet (100 mcg total) by mouth daily.   No facility-administered encounter medications on file as of 08/24/2020.    Allergies (verified) Patient has no known allergies.   History: Past Medical History:  Diagnosis Date  . B12 deficiency 11/08/2010  . Choledocholithiasis with acute cholangitis s/p ERCP 09/08/2013 09/12/2013   Dr  Stacie Gl[facility]   . Closed displaced fracture of shaft of fifth metacarpal bone of right hand 07/2014   Reduced/splinted by Sports Med MD.  . COPD (chronic obstructive pulmonary disease) ([place])    Dr. Elsworth Soho (as of 07/2015 FEV1 stable at 52%)  F/u spirometry stable 06/2016 with Dr. Elsworth Soho.  Spiriva trial in the past--no help.  Pt declines inhaler meds, functions fine w/out COPD meds as of 06/2018.  Marland Kitchen Folate deficiency 07/2019   paresthesias->folic acid supplement started-->paresthesias resolved.  . Fracture of fifth toe, right, closed 09/26/2012  . Fracture of great toe, right, closed 09/2012   Nondisplaced.  Buddy taped, post-op shoe, ortho eval was done s/p initial dx in urgent care center.  Marland Kitchen GERD (gastroesophageal reflux disease)   . History of syncope 12/2004  . Hyperlipidemia   . Iron deficiency 11/08/2010  . Neutropenia 11/08/2010  . Pneumonia 10/20/2010   Initial dx 10/17/10--CXR and CT scan. F/u CT 11/06/10 showed resolving RUL and RML pneumonia and resolving reactive mediastinal LAD.    Marland Kitchen Rheumatoid arthritis(714.0) 10/2010   RF+, CCP+; managed well with Humira as of 06/2018 (Dr. Amil Amen)  . White coat syndrome without hypertension 2018   Reconfirmed 08/2019 with normal home bps   Past Surgical History:  Procedure Laterality Date  . CHOLECYSTECTOMY N/A 09/10/2013   Procedure: LAPAROSCOPIC CHOLECYSTECTOMY, OPEN CHOLECYSTECTOMY WITH INTRAOPERATIVE CHOLANGIOGRAM;  Surgeon: Merrie Roof, MD;  Location: [facility];  Service: General;  Laterality: N/A;  . ERCP N/A 09/08/2013   Procedure: ENDOSCOPIC RETROGRADE CHOLANGIOPANCREATOGRAPHY (ERCP);  Surgeon: Arta Silence, MD;  Location: [facility] ENDOSCOPY;  Service: Endoscopy;  Laterality: N/A;  . INGUINAL HERNIA REPAIR     age 36   Family History  Problem Relation Age of Onset  . Breast cancer Mother 31  . Ovarian cancer Mother 69  . Skin cancer Mother   . Breast cancer Maternal Aunt        dx in her 66s  . Breast cancer Maternal Aunt 70  . Breast cancer  Maternal Aunt 61  . Prostate cancer Cousin        double first cousin  . Hypertension Other        parents late 60's  . Breast cancer Paternal Aunt    Social History   Socioeconomic History  . Marital status: Divorced    Spouse name: Not on file  . Number of children: Not on file  . Years of education: Not on file  . Highest education level: Not on file  Occupational History  . Occupation: retired  Tobacco Use  . Smoking status: Former Smoker    Packs/day: 1.50    Years: 30.00    Pack years: 45.00    Types: Cigarettes    Quit date: 06/18/1998    Years since quitting: 22.2  . Smokeless tobacco: Current User    Types: Chew  Substance and Sexual Activity  . Alcohol use: No    Alcohol/week: 0.0 standard drinks  . Drug use: No  . Sexual activity: Not on file  Other Topics Concern  . Not on file  Social History Narrative   Divorced, 2 step children.   Has live-in girlfriend.   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   [place] native, orig from [place], [place].   Tob: 30 pack-yr hx.  Quit 2000.   No alcohol.  No drugs.   Volunteer with [employer].   Gardening/yard work.   Social Determinants of Health   Financial Resource Strain: Low Risk   . Difficulty of Paying Living Expenses: Not hard at all  Food Insecurity: No Food Insecurity  . Worried About Charity fundraiser in the Last Year: Never true  . Ran Out of Food in the Last Year: Never true  Transportation Needs: No Transportation Needs  . Lack of Transportation (Medical): No  . Lack of Transportation (Non-Medical): No  Physical Activity: Sufficiently Active  . Days of Exercise per Week: 3 days  . Minutes of Exercise per Session: 60 min  Stress: No Stress Concern Present  . Feeling of Stress : Not at all  Social Connections: Moderately Isolated  . Frequency of Communication with Friends and Family: More than three times a week  . Frequency of Social Gatherings with Friends and Family: More  than three times a week  . Attends Religious Services: Never  . Active Member of Clubs or Organizations: Yes  . Attends Archivist Meetings: 1 to 4 times per year  . Marital Status: Divorced    Tobacco Counseling Ready to quit: Not Answered Counseling given: Not Answered   Clinical Intake:  Pre-visit preparation completed: Yes  Pain : No/denies pain     Nutritional Status: BMI of 19-24  Normal Nutritional Risks: None Diabetes: No  How often do you need to have someone help you when you read instructions, pamphlets, or other written materials from your  doctor or pharmacy?: 1 - Never  Diabetic?No  Interpreter Needed?: No  Information entered by :: Caroleen Hamman LPN   Activities of Daily Living In your present state of health, do you have any difficulty performing the following activities: 08/24/2020  Hearing? N  Vision? N  Difficulty concentrating or making decisions? N  Walking or climbing stairs? N  Dressing or bathing? N  Doing errands, shopping? N  Preparing Food and eating ? N  Using the Toilet? N  In the past six months, have you accidently leaked urine? N  Do you have problems with loss of bowel control? N  Managing your Medications? N  Managing your Finances? N  Housekeeping or managing your Housekeeping? N  Some recent data might be hidden    Patient Care Team: Tammi Sou, MD as PCP - General (Family Medicine) Jovita Kussmaul, MD as Consulting Physician (General Surgery) Eston Esters, MD (Inactive) as Consulting Physician (Hematology and Oncology) Arta Silence, MD as Consulting Physician (Gastroenterology) Silverio Decamp, MD as Consulting Physician (Sports Medicine) Hennie Duos, MD as Consulting Physician (Rheumatology) Rigoberto Noel, MD as Consulting Physician (Pulmonary Disease)  Indicate any recent Medical Services you may have received from other than [facility] providers in the past year (date may be approximate).      Assessment:   This is a routine wellness examination for [place].  Hearing/Vision screen  Hearing Screening   125Hz  250Hz  500Hz  1000Hz  2000Hz  3000Hz  4000Hz  6000Hz  8000Hz   Right ear:           Left ear:           Comments: No issues  Vision Screening Comments: Reading glasses Last eye exam-2-3 years ago  Dietary issues and exercise activities discussed: Current Exercise Habits: Home exercise routine, Type of exercise: strength training/weights;treadmill, Time (Minutes): 60, Frequency (Times/Week): 3, Weekly Exercise (Minutes/Week): 180, Exercise limited by: None identified  Goals    . Patient Stated     Maintain healthy active lifestyle      Depression Screen PHQ 2/9 Scores 08/24/2020 05/27/2019  PHQ - 2 Score 0 0    Fall Risk Fall Risk  08/24/2020  Falls in the past year? 0  Number falls in past yr: 0  Injury with Fall? 0  Follow up Falls prevention discussed    FALL RISK PREVENTION PERTAINING TO THE HOME:  Any stairs in or around the home? Yes  If so, are there any without handrails? No  Home free of loose throw rugs in walkways, pet beds, electrical cords, etc? Yes  Adequate lighting in your home to reduce risk of falls? Yes   ASSISTIVE DEVICES UTILIZED TO PREVENT FALLS:  Life alert? No  Use of a cane, walker or w/c? No  Grab bars in the bathroom? No  Shower chair or bench in shower? No  Elevated toilet seat or a handicapped toilet? No   TIMED UP AND GO:  Was the test performed? Yes .  Length of time to ambulate 10 feet: 9 sec.   Gait steady and fast without use of assistive device  Cognitive Function:Normal cognitive status assessed by direct observation by this Nurse Health Advisor. No abnormalities found.          Immunizations Immunization History  Administered Date(s) Administered  . Fluad Quad(high Dose 65+) 05/27/2019, 03/22/2020  . Influenza Split 04/11/2016  . Influenza, High Dose Seasonal PF 05/29/2018  . Influenza,inj,Quad PF,6+ Mos  04/02/2013, 05/16/2017  . Influenza-Unspecified 04/14/2014, 04/07/2015  . Moderna Sars-Covid-2  Vaccination 07/20/2019, 08/17/2019, 05/25/2020    TDAP status: Due, Education has been provided regarding the importance of this vaccine. Advised may receive this vaccine at local pharmacy or Health Dept. Aware to provide a copy of the vaccination record if obtained from local pharmacy or Health Dept. Verbalized acceptance and understanding.  Flu Vaccine status: Up to date  Pneumococcal vaccine status: Declined,  Education has been provided regarding the importance of this vaccine but patient still declined. Advised may receive this vaccine at local pharmacy or Health Dept. Aware to provide a copy of the vaccination record if obtained from local pharmacy or Health Dept. Verbalized acceptance and understanding.   Covid-19 vaccine status: Completed vaccines  Qualifies for Shingles Vaccine? Yes   Zostavax completed No   Shingrix Completed?: No.    Education has been provided regarding the importance of this vaccine. Patient has been advised to call insurance company to determine out of pocket expense if they have not yet received this vaccine. Advised may also receive vaccine at local pharmacy or Health Dept. Verbalized acceptance and understanding.  Screening Tests Health Maintenance  Topic Date Due  . TETANUS/TDAP  Never done  . COLONOSCOPY (Pts 45-58yrs Insurance coverage will need to be confirmed)  Never done  . PNA vac Low Risk Adult (1 of 2 - PCV13) 08/24/2021 (Originally 03/31/2018)  . INFLUENZA VACCINE  Completed  . COVID-19 Vaccine  Completed  . Hepatitis C Screening  Completed  . HPV VACCINES  Aged Out    Health Maintenance  Health Maintenance Due  Topic Date Due  . TETANUS/TDAP  Never done  . COLONOSCOPY (Pts 45-61yrs Insurance coverage will need to be confirmed)  Never done    Colorectal cancer screening: Due-Patient declined.  Lung Cancer Screening: (Low Dose CT Chest  recommended if Age 33-80 years, 30 pack-year currently smoking OR have quit w/in 15years.) does not qualify.     Additional Screening:  Hepatitis C Screening:  Completed 11/28/2010  Vision Screening: Recommended annual ophthalmology exams for early detection of glaucoma and other disorders of the eye. Is the patient up to date with their annual eye exam?  No  Who is the Buford Gayler or what is the name of the office in which the patient attends annual eye exams? unsure If pt is not established with a Joselle Deeds, would they like to be referred to a Eleanore Junio to establish care? No .   Dental Screening: Recommended annual dental exams for proper oral hygiene  Community Resource Referral / Chronic Care Management: CRR required this visit?  No   CCM required this visit?  No      Plan:     I have personally reviewed and noted the following in the patient's chart:   . Medical and social history . Use of alcohol, tobacco or illicit drugs  . Current medications and supplements . Functional ability and status . Nutritional status . Physical activity . Advanced directives . List of other physicians . Hospitalizations, surgeries, and ER visits in previous 12 months . Vitals . Screenings to include cognitive, depression, and falls . Referrals and appointments  In addition, I have reviewed and discussed with patient certain preventive protocols, quality metrics, and best practice recommendations. A written personalized care plan for preventive services as well as general preventive health recommendations were provided to patient.     Marta Antu, LPN   09/22/5679  Nurse Health Advisor  Nurse Notes: None

## 2020-08-24 NOTE — Patient Instructions (Signed)
George Marshall , Thank you for taking time to come for your Medicare Wellness Visit. I appreciate your ongoing commitment to your health goals. Please review the following plan we discussed and let me know if I can assist you in the future.   Screening recommendations/referrals: Colonoscopy: Due-Declined today. Please call the office to schedule if you change your mind. Recommended yearly ophthalmology/optometry visit for glaucoma screening and checkup Recommended yearly dental visit for hygiene and checkup  Vaccinations: Influenza vaccine: Up to date Pneumococcal vaccine: Declined Tdap vaccine: Discuss with pharmacy Shingles vaccine: Discuss with pharmacy Covid-19: Completed   Advanced directives: Please bring a copy for your chart.  Conditions/risks identified: See problem list  Next appointment: Follow up in one year for your annual wellness visit.   Preventive Care 28 Years and Older, Male Preventive care refers to lifestyle choices and visits with your health care provider that can promote health and wellness. What does preventive care include?  A yearly physical exam. This is also called an annual well check.  Dental exams once or twice a year.  Routine eye exams. Ask your health care provider how often you should have your eyes checked.  Personal lifestyle choices, including:  Daily care of your teeth and gums.  Regular physical activity.  Eating a healthy diet.  Avoiding tobacco and drug use.  Limiting alcohol use.  Practicing safe sex.  Taking low doses of aspirin every day.  Taking vitamin and mineral supplements as recommended by your health care provider. What happens during an annual well check? The services and screenings done by your health care provider during your annual well check will depend on your age, overall health, lifestyle risk factors, and family history of disease. Counseling  Your health care provider may ask you questions about  your:  Alcohol use.  Tobacco use.  Drug use.  Emotional well-being.  Home and relationship well-being.  Sexual activity.  Eating habits.  History of falls.  Memory and ability to understand (cognition).  Work and work Statistician. Screening  You may have the following tests or measurements:  Height, weight, and BMI.  Blood pressure.  Lipid and cholesterol levels. These may be checked every 5 years, or more frequently if you are over 68 years old.  Skin check.  Lung cancer screening. You may have this screening every year starting at age 68 if you have a 30-pack-year history of smoking and currently smoke or have quit within the past 15 years.  Fecal occult blood test (FOBT) of the stool. You may have this test every year starting at age 68.  Flexible sigmoidoscopy or colonoscopy. You may have a sigmoidoscopy every 5 years or a colonoscopy every 10 years starting at age 68.  Prostate cancer screening. Recommendations will vary depending on your family history and other risks.  Hepatitis C blood test.  Hepatitis B blood test.  Sexually transmitted disease (STD) testing.  Diabetes screening. This is done by checking your blood sugar (glucose) after you have not eaten for a while (fasting). You may have this done every 1-3 years.  Abdominal aortic aneurysm (AAA) screening. You may need this if you are a current or former smoker.  Osteoporosis. You may be screened starting at age 68 if you are at high risk. Talk with your health care provider about your test results, treatment options, and if necessary, the need for more tests. Vaccines  Your health care provider may recommend certain vaccines, such as:  Influenza vaccine. This is recommended every  year.  Tetanus, diphtheria, and acellular pertussis (Tdap, Td) vaccine. You may need a Td booster every 10 years.  Zoster vaccine. You may need this after age 68.  Pneumococcal 13-valent conjugate (PCV13) vaccine.  One dose is recommended after age 68.  Pneumococcal polysaccharide (PPSV23) vaccine. One dose is recommended after age 68. Talk to your health care provider about which screenings and vaccines you need and how often you need them. This information is not intended to replace advice given to you by your health care provider. Make sure you discuss any questions you have with your health care provider. Document Released: 07/01/2015 Document Revised: 02/22/2016 Document Reviewed: 04/05/2015 Elsevier Interactive Patient Education  2017 [place] Prevention in the Home Falls can cause injuries. They can happen to people of all ages. There are many things you can do to make your home safe and to help prevent falls. What can I do on the outside of my home?  Regularly fix the edges of walkways and driveways and fix any cracks.  Remove anything that might make you trip as you walk through a door, such as a raised step or threshold.  Trim any bushes or trees on the path to your home.  Use bright outdoor lighting.  Clear any walking paths of anything that might make someone trip, such as rocks or tools.  Regularly check to see if handrails are loose or broken. Make sure that both sides of any steps have handrails.  Any raised decks and porches should have guardrails on the edges.  Have any leaves, snow, or ice cleared regularly.  Use sand or salt on walking paths during winter.  Clean up any spills in your garage right away. This includes oil or grease spills. What can I do in the bathroom?  Use night lights.  Install grab bars by the toilet and in the tub and shower. Do not use towel bars as grab bars.  Use non-skid mats or decals in the tub or shower.  If you need to sit down in the shower, use a plastic, non-slip stool.  Keep the floor dry. Clean up any water that spills on the floor as soon as it happens.  Remove soap buildup in the tub or shower regularly.  Attach bath  mats securely with double-sided non-slip rug tape.  Do not have throw rugs and other things on the floor that can make you trip. What can I do in the bedroom?  Use night lights.  Make sure that you have a light by your bed that is easy to reach.  Do not use any sheets or blankets that are too big for your bed. They should not hang down onto the floor.  Have a firm chair that has side arms. You can use this for support while you get dressed.  Do not have throw rugs and other things on the floor that can make you trip. What can I do in the kitchen?  Clean up any spills right away.  Avoid walking on wet floors.  Keep items that you use a lot in easy-to-reach places.  If you need to reach something above you, use a strong step stool that has a grab bar.  Keep electrical cords out of the way.  Do not use floor polish or wax that makes floors slippery. If you must use wax, use non-skid floor wax.  Do not have throw rugs and other things on the floor that can make you trip. What can  I do with my stairs?  Do not leave any items on the stairs.  Make sure that there are handrails on both sides of the stairs and use them. Fix handrails that are broken or loose. Make sure that handrails are as long as the stairways.  Check any carpeting to make sure that it is firmly attached to the stairs. Fix any carpet that is loose or worn.  Avoid having throw rugs at the top or bottom of the stairs. If you do have throw rugs, attach them to the floor with carpet tape.  Make sure that you have a light switch at the top of the stairs and the bottom of the stairs. If you do not have them, ask someone to add them for you. What else can I do to help prevent falls?  Wear shoes that:  Do not have high heels.  Have rubber bottoms.  Are comfortable and fit you well.  Are closed at the toe. Do not wear sandals.  If you use a stepladder:  Make sure that it is fully opened. Do not climb a closed  stepladder.  Make sure that both sides of the stepladder are locked into place.  Ask someone to hold it for you, if possible.  Clearly mark and make sure that you can see:  Any grab bars or handrails.  First and last steps.  Where the edge of each step is.  Use tools that help you move around (mobility aids) if they are needed. These include:  Canes.  Walkers.  Scooters.  Crutches.  Turn on the lights when you go into a dark area. Replace any light bulbs as soon as they burn out.  Set up your furniture so you have a clear path. Avoid moving your furniture around.  If any of your floors are uneven, fix them.  If there are any pets around you, be aware of where they are.  Review your medicines with your doctor. Some medicines can make you feel dizzy. This can increase your chance of falling. Ask your doctor what other things that you can do to help prevent falls. This information is not intended to replace advice given to you by your health care provider. Make sure you discuss any questions you have with your health care provider. Document Released: 03/31/2009 Document Revised: 11/10/2015 Document Reviewed: 07/09/2014 Elsevier Interactive Patient Education  2017 [employer].

## 2020-09-16 HISTORY — PX: OTHER SURGICAL HISTORY: SHX169

## 2020-09-19 NOTE — Progress Notes (Signed)
OFFICE VISIT  09/20/2020  CC:  Chief Complaint  Patient presents with  . Follow-up    RCI, fasting    HPI:    Patient is a 69 y.o. Caucasian male who presents for annual health maintenance exam and for 6 mo f/u hx of vit B12 def and hx of folate deficiency. He has RF + RA, followed by Dr. Amil Amen and treated with humira. He has whitecoat syndrome w/out dx of HTN.  A/P as of last visit: "1) Folate deficiency: all paresthesias gone since getting on 0.8 mg folate qd. Recheck folate level today.  2) Vitamin B12 def: taking 1000 mcg b12 daily. Recheck b12 level today.  3) Preventative health: Colon ca screening: has never had colonoscopy--pt defers this discussion for now. Pneumovax 23->declined.  Tdap->declines.  Flu->given today.  Shingrix->declined. FLP, CMET, CBC, PSA, etc all to be done via FD 05/2020, also gets some labs regularly followed by his rheum MD.  4) Viral URI: discussed symptom mgmt. Signs/symptoms to call or return for were reviewed and pt expressed understanding.  He'll bring copy of labs he gets 05/2020 to our office for review."  INTERIM HX: Feeling well other than some allergic rhinitis sx's.  No formal exercise. Chews tobacco but quit smoking cigs back in 2000.  Checks bp at home 2-3 times a month, says sometimes around 130 but admits sometimes in 607P-710G systolic, diastolics not recalled well.   He is on humira inj q2 wks via rheum for his RA.  Last labs were 06/30/20 and we don't have these results---presumed normal by pt.  Taking vit b12 and folate supplements orally as rx'd.   Past Medical History:  Diagnosis Date  . B12 deficiency 11/08/2010  . Choledocholithiasis with acute cholangitis s/p ERCP 09/08/2013 09/12/2013   Dr Stacie Glaze GI   . Closed displaced fracture of shaft of fifth metacarpal bone of right hand 07/2014   Reduced/splinted by Sports Med MD.  . COPD (chronic obstructive pulmonary disease) ([place])    Dr. Elsworth Soho (as of 07/2015  FEV1 stable at 52%)  F/u spirometry stable 06/2016 with Dr. Elsworth Soho.  Spiriva trial in the past--no help.  Pt declines inhaler meds, functions fine w/out COPD meds as of 06/2018.  Marland Kitchen Folate deficiency 07/2019   paresthesias->folic acid supplement started-->paresthesias resolved.  . Fracture of fifth toe, right, closed 09/26/2012  . Fracture of great toe, right, closed 09/2012   Nondisplaced.  Buddy taped, post-op shoe, ortho eval was done s/p initial dx in urgent care center.  Marland Kitchen GERD (gastroesophageal reflux disease)   . History of syncope 12/2004  . Hyperlipidemia   . Iron deficiency 11/08/2010  . Neutropenia 11/08/2010  . Pneumonia 10/20/2010   Initial dx 10/17/10--CXR and CT scan. F/u CT 11/06/10 showed resolving RUL and RML pneumonia and resolving reactive mediastinal LAD.    Marland Kitchen Rheumatoid arthritis(714.0) 10/2010   RF+, CCP+; managed well with Humira as of 06/2018 (Dr. Amil Amen)  . White coat syndrome without hypertension 2018   Reconfirmed 08/2019 with normal home bps    Past Surgical History:  Procedure Laterality Date  . CHOLECYSTECTOMY N/A 09/10/2013   Procedure: LAPAROSCOPIC CHOLECYSTECTOMY, OPEN CHOLECYSTECTOMY WITH INTRAOPERATIVE CHOLANGIOGRAM;  Surgeon: Merrie Roof, MD;  Location: [facility];  Service: General;  Laterality: N/A;  . ERCP N/A 09/08/2013   Procedure: ENDOSCOPIC RETROGRADE CHOLANGIOPANCREATOGRAPHY (ERCP);  Surgeon: Arta Silence, MD;  Location[facility];  Service: Endoscopy;  Laterality: N/A;  . INGUINAL HERNIA REPAIR     age 43  Social History   Socioeconomic History  . Marital status: Divorced    Spouse name: Not on file  . Number of children: Not on file  . Years of education: Not on file  . Highest education level: Not on file  Occupational History  . Occupation: retired  Tobacco Use  . Smoking status: Former Smoker    Packs/day: 1.50    Years: 30.00    Pack years: 45.00    Types: Cigarettes    Quit date: 06/18/1998    Years since quitting: 22.2  . Smokeless  tobacco: Current User    Types: Chew  Substance and Sexual Activity  . Alcohol use: No    Alcohol/week: 0.0 standard drinks  . Drug use: No  . Sexual activity: Not on file  Other Topics Concern  . Not on file  Social History Narrative   Divorced, 2 step children.   Has live-in girlfriend.   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   [place] native, orig from [place], [place].   Tob: 30 pack-yr hx.  Quit 2000.   No alcohol.  No drugs.   Volunteer with [employer].   Gardening/yard work.   Social Determinants of Health   Financial Resource Strain: Low Risk   . Difficulty of Paying Living Expenses: Not hard at all  Food Insecurity: No Food Insecurity  . Worried About Charity fundraiser in the Last Year: Never true  . Ran Out of Food in the Last Year: Never true  Transportation Needs: No Transportation Needs  . Lack of Transportation (Medical): No  . Lack of Transportation (Non-Medical): No  Physical Activity: Sufficiently Active  . Days of Exercise per Week: 3 days  . Minutes of Exercise per Session: 60 min  Stress: No Stress Concern Present  . Feeling of Stress : Not at all  Social Connections: Moderately Isolated  . Frequency of Communication with Friends and Family: More than three times a week  . Frequency of Social Gatherings with Friends and Family: More than three times a week  . Attends Religious Services: Never  . Active Member of Clubs or Organizations: Yes  . Attends Archivist Meetings: 1 to 4 times per year  . Marital Status: Divorced   Family History  Problem Relation Age of Onset  . Breast cancer Mother 70  . Ovarian cancer Mother 48  . Skin cancer Mother   . Breast cancer Maternal Aunt        dx in her 40s  . Breast cancer Maternal Aunt 70  . Breast cancer Maternal Aunt 61  . Prostate cancer Cousin        double first cousin  . Hypertension Other        parents late 25's  . Breast cancer Paternal Aunt     Outpatient  Medications Prior to Visit  Medication Sig Dispense Refill  . adalimumab (HUMIRA PEN) 40 MG/0.8ML injection Inject 0.8 mLs (40 mg total) into the skin every 14 (fourteen) days.  0  . Folic Acid (FOLATE PO) Take 0.4 mg by mouth in the morning and at bedtime.    . vitamin B-12 (CYANOCOBALAMIN) 100 MCG tablet Take 1 tablet (100 mcg total) by mouth daily. 30 tablet 0   No facility-administered medications prior to visit.    No Known Allergies  ROS Review of Systems  Constitutional: Negative for appetite change, chills, fatigue and fever.  HENT: Negative for congestion, dental problem, ear pain and sore  throat.   Eyes: Negative for discharge, redness and visual disturbance.  Respiratory: Negative for cough, chest tightness, shortness of breath and wheezing.   Cardiovascular: Negative for chest pain, palpitations and leg swelling.  Gastrointestinal: Negative for abdominal pain, blood in stool, diarrhea, nausea and vomiting.  Genitourinary: Negative for difficulty urinating, dysuria, flank pain, frequency, hematuria and urgency.  Musculoskeletal: Negative for arthralgias, back pain, joint swelling, myalgias and neck stiffness.  Skin: Negative for pallor and rash.  Neurological: Negative for dizziness, speech difficulty, weakness and headaches.  Hematological: Negative for adenopathy. Does not bruise/bleed easily.  Psychiatric/Behavioral: Negative for confusion and sleep disturbance. The patient is not nervous/anxious.      PE: Vitals with BMI 09/20/2020 08/24/2020 05/24/2020  Height 5\' 11"  5\' 11"  5\' 11"   Weight 144 lbs 6 oz 145 lbs 2 oz 146 lbs 2 oz  BMI 20.15 29.52 84.13  Systolic 244 010 272  Diastolic 82 98 90  Pulse 98 89 96    Gen: Alert, well appearing.  Patient is oriented to person, place, time, and situation. AFFECT: pleasant, lucid thought and speech. ENT: Ears: EACs clear, normal epithelium.  TMs with good light reflex and landmarks bilaterally.  Eyes: no injection, icteris,  swelling, or exudate.  EOMI, PERRLA. Nose: no drainage or turbinate edema/swelling.  No injection or focal lesion.  Mouth: lips without lesion/swelling.  Oral mucosa pink and moist.  Dentition intact and without obvious caries or gingival swelling.  Oropharynx without erythema, exudate, or swelling.  Neck: supple/nontender.  No LAD, mass, or TM.  Carotid pulses 2+ bilaterally, without bruits. CV: RRR, no m/r/g.   LUNGS: CTA bilat, nonlabored resps, good aeration in all lung fields. ABD: soft, NT, ND, BS normal.  No hepatospenomegaly or mass.  No bruits. EXT: no clubbing, cyanosis, or edema.  Musculoskeletal: no joint swelling, erythema, warmth, or tenderness.  ROM of all joints intact. Skin - no sores or suspicious lesions or rashes or color changes  LABS:  Lab Results  Component Value Date   TSH 1.89 08/21/2019   Lab Results  Component Value Date   WBC 4.9 08/21/2019   HGB 17.1 08/21/2019   HCT 47.8 08/21/2019   MCV 86.3 08/21/2019   PLT 175 08/21/2019   Lab Results  Component Value Date   VITAMINB12 757 03/22/2020   Folate level 08/21/19= 14.1 (wnl).  Lab Results  Component Value Date   CREATININE 0.98 08/21/2019   BUN 12 08/21/2019   NA 140 08/21/2019   K 3.5 08/21/2019   CL 101 08/21/2019   CO2 30 08/21/2019   Lab Results  Component Value Date   ALT 13 08/21/2019   AST 14 08/21/2019   ALKPHOS 75 01/07/2019   BILITOT 1.6 (H) 08/21/2019   Lab Results  Component Value Date   CHOL 161 05/08/2007   Lab Results  Component Value Date   HDL 32.0 (L) 05/08/2007   Lab Results  Component Value Date   LDLCALC 111 (H) 05/08/2007   Lab Results  Component Value Date   TRIG 88 05/08/2007   Lab Results  Component Value Date   CHOLHDL 5.0 CALC 05/08/2007   Lab Results  Component Value Date   PSA 0.68 05/08/2007   PSA 0.91 05/17/2006   Lab Results  Component Value Date   HGBA1C 4.2 08/21/2019   IMPRESSION AND PLAN:  1) Vit B12 and folate deficiencies: these  came up to normal with supplementation at last check. Cont 100 mcg vit b12 po qd and folate  0.4mg  bid. Check levels today.  2) Elev bp, hx of white coat HTN--->sounds like he likely has some true HTN. He is a little resistant to accepting this right now and wants to monitor bp more regularly at home to confirm before considering medication. Instructions: Check your blood pressure and heart rate daily for 2 weeks. Average the numbers and if average > 130 on top or >80 on bottom then call or return to discuss. Check lytes/cr today.  3) Health maintenance exam/Preventative health care:  Vaccines: he has declined pneumovax, Tdap, and shingrix in the past-->continues to decline today. Colon cancer screening: has never had a colonoscopy.  He is still thinking about this and plans on pursuing this year but not ready yet. Prostate cancer screening: PSA today. Hep C screening neg 2012. AAA screening->pt qualifies->ordered today. Cholesterol screening today.  An After Visit Summary was printed and given to the patient.  FOLLOW UP: Return in about 6 months (around 03/22/2021) for routine chronic illness f/u.  Signed:  Crissie Sickles, MD           09/20/2020

## 2020-09-20 ENCOUNTER — Other Ambulatory Visit: Payer: Self-pay

## 2020-09-20 ENCOUNTER — Ambulatory Visit (INDEPENDENT_AMBULATORY_CARE_PROVIDER_SITE_OTHER): Payer: Medicare HMO | Admitting: Family Medicine

## 2020-09-20 ENCOUNTER — Encounter: Payer: Self-pay | Admitting: Family Medicine

## 2020-09-20 VITALS — BP 160/82 | HR 98 | Temp 97.4°F | Resp 16 | Ht 71.0 in | Wt 144.4 lb

## 2020-09-20 DIAGNOSIS — Z136 Encounter for screening for cardiovascular disorders: Secondary | ICD-10-CM

## 2020-09-20 DIAGNOSIS — Z125 Encounter for screening for malignant neoplasm of prostate: Secondary | ICD-10-CM | POA: Diagnosis not present

## 2020-09-20 DIAGNOSIS — E538 Deficiency of other specified B group vitamins: Secondary | ICD-10-CM

## 2020-09-20 DIAGNOSIS — R030 Elevated blood-pressure reading, without diagnosis of hypertension: Secondary | ICD-10-CM | POA: Diagnosis not present

## 2020-09-20 DIAGNOSIS — Z13220 Encounter for screening for lipoid disorders: Secondary | ICD-10-CM

## 2020-09-20 DIAGNOSIS — O161 Unspecified maternal hypertension, first trimester: Secondary | ICD-10-CM

## 2020-09-20 DIAGNOSIS — Z0001 Encounter for general adult medical examination with abnormal findings: Secondary | ICD-10-CM | POA: Diagnosis not present

## 2020-09-20 NOTE — Patient Instructions (Addendum)
Check your blood pressure and heart rate daily for 2 weeks. Average the numbers and if average > 130 on top or >80 on bottom then call or return to discuss.

## 2020-09-25 LAB — CBC WITH DIFFERENTIAL/PLATELET
Absolute Monocytes: 350 cells/uL (ref 200–950)
Basophils Absolute: 40 cells/uL (ref 0–200)
Basophils Relative: 0.8 %
Eosinophils Absolute: 80 cells/uL (ref 15–500)
Eosinophils Relative: 1.6 %
HCT: 46 % (ref 38.5–50.0)
Hemoglobin: 16 g/dL (ref 13.2–17.1)
Lymphs Abs: 1195 cells/uL (ref 850–3900)
MCH: 30.5 pg (ref 27.0–33.0)
MCHC: 34.8 g/dL (ref 32.0–36.0)
MCV: 87.8 fL (ref 80.0–100.0)
MPV: 9.9 fL (ref 7.5–12.5)
Monocytes Relative: 7 %
Neutro Abs: 3335 cells/uL (ref 1500–7800)
Neutrophils Relative %: 66.7 %
Platelets: 171 10*3/uL (ref 140–400)
RBC: 5.24 10*6/uL (ref 4.20–5.80)
RDW: 13 % (ref 11.0–15.0)
Total Lymphocyte: 23.9 %
WBC: 5 10*3/uL (ref 3.8–10.8)

## 2020-09-25 LAB — COMPREHENSIVE METABOLIC PANEL
AG Ratio: 1.4 (calc) (ref 1.0–2.5)
ALT: 14 U/L (ref 9–46)
AST: 18 U/L (ref 10–35)
Albumin: 3.9 g/dL (ref 3.6–5.1)
Alkaline phosphatase (APISO): 79 U/L (ref 35–144)
BUN: 10 mg/dL (ref 7–25)
CO2: 28 mmol/L (ref 20–32)
Calcium: 8.9 mg/dL (ref 8.6–10.3)
Chloride: 103 mmol/L (ref 98–110)
Creat: 1 mg/dL (ref 0.70–1.25)
Globulin: 2.7 g/dL (calc) (ref 1.9–3.7)
Glucose, Bld: 105 mg/dL — ABNORMAL HIGH (ref 65–99)
Potassium: 3.8 mmol/L (ref 3.5–5.3)
Sodium: 143 mmol/L (ref 135–146)
Total Bilirubin: 1.4 mg/dL — ABNORMAL HIGH (ref 0.2–1.2)
Total Protein: 6.6 g/dL (ref 6.1–8.1)

## 2020-09-25 LAB — LIPID PANEL
Cholesterol: 199 mg/dL (ref <200)
HDL: 46 mg/dL (ref >=40)
LDL Cholesterol (Calc): 133 mg/dL (calc) — ABNORMAL HIGH
Non-HDL Cholesterol (Calc): 153 mg/dL (calc) — ABNORMAL HIGH (ref <130)
Total CHOL/HDL Ratio: 4.3 (calc) (ref <5.0)
Triglycerides: 97 mg/dL (ref <150)

## 2020-09-25 LAB — VITAMIN B12: Vitamin B-12: 712 pg/mL (ref 200–1100)

## 2020-09-25 LAB — FOLATE: Folate: 18.4 ng/mL

## 2020-09-25 LAB — PSA: PSA: 0.76 ng/mL (ref <=4.0)

## 2020-09-26 ENCOUNTER — Telehealth: Payer: Self-pay

## 2020-09-26 ENCOUNTER — Encounter: Payer: Self-pay | Admitting: Family Medicine

## 2020-09-26 DIAGNOSIS — E7800 Pure hypercholesterolemia, unspecified: Secondary | ICD-10-CM

## 2020-09-26 MED ORDER — ATORVASTATIN CALCIUM 20 MG PO TABS: 20.0000 mg | ORAL_TABLET | Freq: Every day | ORAL | 2 refills | Status: DC

## 2020-09-26 NOTE — Telephone Encounter (Signed)
-----   Message from Tammi Sou, MD sent at 09/26/2020  8:27 AM EDT ----- All labs normal except cholesterol significantly elevated. I recommend pt start cholesterol-lowering therapy in order to try to decrease their risk of developing cardiovascular disease or stroke.  If pt agreeable pls eRx atorvastatin 20mg , 1 tab po qd, #30 rFx 2 Recheck FLP--lab visit only--in 2-3 months, dx is pure hypercholesterolemia.--thx

## 2020-09-26 NOTE — Telephone Encounter (Signed)
Spoke with pt regarding labs and instructions. Rx sent and sched for labs

## 2020-10-04 ENCOUNTER — Other Ambulatory Visit: Payer: Self-pay

## 2020-10-04 ENCOUNTER — Ambulatory Visit (HOSPITAL_BASED_OUTPATIENT_CLINIC_OR_DEPARTMENT_OTHER)
Admission: RE | Admit: 2020-10-04 | Discharge: 2020-10-04 | Disposition: A | Discharge status: Home / Self Care | Payer: Medicare HMO | Source: Ambulatory Visit | Attending: Family Medicine | Admitting: Family Medicine

## 2020-10-04 ENCOUNTER — Encounter: Payer: Self-pay | Admitting: Family Medicine

## 2020-10-04 DIAGNOSIS — Z136 Encounter for screening for cardiovascular disorders: Secondary | ICD-10-CM | POA: Diagnosis not present

## 2020-10-04 DIAGNOSIS — Z87891 Personal history of nicotine dependence: Secondary | ICD-10-CM | POA: Diagnosis not present

## 2020-10-26 ENCOUNTER — Other Ambulatory Visit: Payer: Self-pay

## 2020-10-26 ENCOUNTER — Emergency Department
Admission: EM | Admit: 2020-10-26 | Discharge: 2020-10-26 | Disposition: A | Discharge status: Home / Self Care | Payer: Medicare HMO | Source: Home / Self Care

## 2020-10-26 DIAGNOSIS — L03319 Cellulitis of trunk, unspecified: Secondary | ICD-10-CM

## 2020-10-26 DIAGNOSIS — S2096XA Insect bite (nonvenomous) of unspecified parts of thorax, initial encounter: Secondary | ICD-10-CM | POA: Diagnosis not present

## 2020-10-26 DIAGNOSIS — W57XXXA Bitten or stung by nonvenomous insect and other nonvenomous arthropods, initial encounter: Secondary | ICD-10-CM | POA: Diagnosis not present

## 2020-10-26 MED ORDER — DOXYCYCLINE HYCLATE 100 MG PO CAPS: 100.0000 mg | ORAL_CAPSULE | Freq: Two times a day (BID) | ORAL | 0 refills | Status: DC

## 2020-10-26 NOTE — ED Triage Notes (Signed)
Pt presents to Urgent Care with c/o itching and redness to L chest/axilla where he removed a tick (probable deer tick) 3-4 days ago. Redness and streaking noted.

## 2020-10-26 NOTE — Discharge Instructions (Addendum)
TALE THE ANTIBIOTIC 2 X A DAY TAKE WITH FOOD GET 2 DOSES IN TODAY

## 2020-10-26 NOTE — ED Provider Notes (Signed)
[facility]    CSN: [id number] Arrival date & time: 10/26/20  0820      History   Chief Complaint Chief Complaint  Patient presents with  . Insect Bite    Tick    HPI George Marshall is a 70 y.o. male.   HPI   Patient is here for tick bite.  He found a tick under his left axilla on his flank about 2 to 3 days ago.  He moved.  He is certain he removed the entire tick.  The only reason he is here is that he has an enlarging red itchy rash that is forming around the site of the bite.  He is worried about "check fever".  He has had no fever chills, no body aches or joint pain.  Past Medical History:  Diagnosis Date  . B12 deficiency 11/08/2010  . Choledocholithiasis with acute cholangitis s/p ERCP 09/08/2013 09/12/2013   Dr Stacie Glaze GI   . Closed displaced fracture of shaft of fifth metacarpal bone of right hand 07/2014   Reduced/splinted by Sports Med MD.  . COPD (chronic obstructive pulmonary disease) ([place])    Dr. Elsworth Soho (as of 07/2015 FEV1 stable at 52%)  F/u spirometry stable 06/2016 with Dr. Elsworth Soho.  Spiriva trial in the past--no help.  Pt declines inhaler meds, functions fine w/out COPD meds as of 06/2018.  Marland Kitchen Folate deficiency 07/2019   paresthesias->folic acid supplement started-->paresthesias resolved.  . Fracture of fifth toe, right, closed 09/26/2012  . Fracture of great toe, right, closed 09/2012   Nondisplaced.  Buddy taped, post-op shoe, ortho eval was done s/p initial dx in urgent care center.  Marland Kitchen GERD (gastroesophageal reflux disease)   . History of syncope 12/2004  . Hyperlipidemia    recommended statin 09/2020  . Pneumonia 10/20/2010   Initial dx 10/17/10--CXR and CT scan. F/u CT 11/06/10 showed resolving RUL and RML pneumonia and resolving reactive mediastinal LAD.    Marland Kitchen Rheumatoid arthritis(714.0) 10/2010   RF+, CCP+; managed well with Humira as of 06/2018 (Dr. Amil Amen)  . White coat syndrome without hypertension 2018   Reconfirmed 08/2019 with normal home bps     Patient Active Problem List   Diagnosis Date Noted  . Closed fracture of fifth metacarpal bone of right hand 07/20/2014  . Acute cholecystitis s/p lap/open cholecystectomy 09/10/2013 09/07/2013  . Rheumatoid arthritis ([place]) 12/27/2010  . Anemia 12/27/2010  . Hyperglycemia 12/27/2010  . COPD (chronic obstructive pulmonary disease) ([place]) 11/21/2010  . Neutropenia ([place]) 11/08/2010  . B12 deficiency 11/08/2010  . Iron deficiency 11/08/2010  . Sinusitis 09/26/2010  . GERD 05/29/2007  . HYPERLIPIDEMIA 05/23/2007    Past Surgical History:  Procedure Laterality Date  . AORTIC ULTRASOUND  09/2020   Medicare screening-->NEG for aneurism.  (+prox aortic ectasia).  . CHOLECYSTECTOMY N/A 09/10/2013   Procedure: LAPAROSCOPIC CHOLECYSTECTOMY, OPEN CHOLECYSTECTOMY WITH INTRAOPERATIVE CHOLANGIOGRAM;  Surgeon: Merrie Roof, MD;  Location: [facility];  Service: General;  Laterality: N/A;  . ERCP N/A 09/08/2013   Procedure: ENDOSCOPIC RETROGRADE CHOLANGIOPANCREATOGRAPHY (ERCP);  Surgeon: Arta Silence, MD;  Location: [facility] ENDOSCOPY;  Service: Endoscopy;  Laterality: N/A;  . INGUINAL HERNIA REPAIR     age 65       Home Medications    Prior to Admission medications   Medication Sig Start Date End Date Taking? Authorizing Provider  doxycycline (VIBRAMYCIN) 100 MG capsule Take 1 capsule (100 mg total) by mouth 2 (two) times daily. TAKE WITH FOOD 10/26/20  Yes Meda Coffee,  Jennette Banker, MD  adalimumab (HUMIRA PEN) 40 MG/0.8ML injection Inject 0.8 mLs (40 mg total) into the skin every 14 (fourteen) days. 09/14/13   Nat Christen, PA-C  atorvastatin (LIPITOR) 20 MG tablet Take 1 tablet (20 mg total) by mouth at bedtime. 09/26/20   [name], Adrian Blackwater, MD  Folic Acid (FOLATE PO) Take 0.4 mg by mouth in the morning and at bedtime.    [provider]  vitamin B-12 (CYANOCOBALAMIN) 100 MCG tablet Take 1 tablet (100 mcg total) by mouth daily. 03/22/20   [name], Adrian Blackwater, MD    Family History Family History   Problem Relation Age of Onset  . Breast cancer Mother 71  . Ovarian cancer Mother 26  . Skin cancer Mother   . Pneumonia Father   . Breast cancer Maternal Aunt        dx in her 47s  . Breast cancer Maternal Aunt 70  . Breast cancer Maternal Aunt 61  . Prostate cancer Cousin        double first cousin  . Hypertension Other        parents late 59's  . Breast cancer Paternal Aunt     Social History Social History   Tobacco Use  . Smoking status: Former Smoker    Packs/day: 1.50    Years: 30.00    Pack years: 45.00    Types: Cigarettes    Quit date: 06/18/1998    Years since quitting: 22.3  . Smokeless tobacco: Current User    Types: Chew  Vaping Use  . Vaping Use: Never used  Substance Use Topics  . Alcohol use: No  . Drug use: No     Allergies   Patient has no known allergies.   Review of Systems Review of Systems  See HPI Physical Exam Triage Vital Signs ED Triage Vitals  Enc Vitals Group     BP 10/26/20 0838 (!) 175/98     Pulse Rate 10/26/20 0838 94     Resp 10/26/20 0838 18     Temp 10/26/20 0838 98.1 F (36.7 C)     Temp Source 10/26/20 0838 Oral     SpO2 10/26/20 0838 100 %     Weight 10/26/20 0835 145 lb (65.8 kg)     Height 10/26/20 0835 5\' 11"  (1.803 m)     Head Circumference --      Peak Flow --      Pain Score 10/26/20 0835 0     Pain Loc --      Pain Edu? --      Excl. in [place]? --    No data found.  Updated Vital Signs BP (!) 164/100 (BP Location: Left Arm)   Pulse 94   Temp 98.1 F (36.7 C) (Oral)   Resp 18   Ht 5\' 11"  (1.803 m)   Wt 65.8 kg   SpO2 100%   BMI 20.22 kg/m        Physical Exam Constitutional:      General: He is not in acute distress.    Appearance: He is well-developed.  HENT:     Head: Normocephalic and atraumatic.  Eyes:     Conjunctiva/sclera: Conjunctivae normal.     Pupils: Pupils are equal, round, and reactive to light.  Cardiovascular:     Rate and Rhythm: Normal rate.  Pulmonary:     Effort:  Pulmonary effort is normal. No respiratory distress.  Abdominal:     General: There is no distension.  Palpations: Abdomen is soft.  Musculoskeletal:        General: Normal range of motion.     Cervical back: Normal range of motion.  Skin:    General: Skin is warm and dry.     Comments: 2 mm across.  There is surrounding induration and erythema that measures 3 cm across.  No bull's-eye appearance  Neurological:     Mental Status: He is alert.  Psychiatric:        Behavior: Behavior normal.      UC Treatments / Results  Labs (all labs ordered are listed, but only abnormal results are displayed) Labs Reviewed - No data to display  EKG   Radiology No results found.  Procedures Procedures (including critical care time)  Medications Ordered in UC Medications - No data to display  Initial Impression / Assessment and Plan / UC Course  I have reviewed the triage vital signs and the nursing notes.  Pertinent labs & imaging results that were available during my care of the patient were reviewed by me and considered in my medical decision making (see chart for details).    *Leg bite with early cellulitis.  Uncertain if there is any risk of tickborne illness.  We will treat with doxycycline.  Return as needed Final Clinical Impressions(s) / UC Diagnoses   Final diagnoses:  Cellulitis of trunk, unspecified site of trunk  Insect bite of thoracic wall, unspecified whether front or back, initial encounter     Discharge Instructions     TALE THE ANTIBIOTIC 2 X A DAY TAKE WITH FOOD GET 2 DOSES IN TODAY   ED Prescriptions    Medication Sig Dispense Auth. Provider   doxycycline (VIBRAMYCIN) 100 MG capsule Take 1 capsule (100 mg total) by mouth 2 (two) times daily. TAKE WITH FOOD 20 capsule Raylene Everts, MD     PDMP not reviewed this encounter.   Raylene Everts, MD 10/26/20 [phone]

## 2020-11-08 DIAGNOSIS — U071 COVID-19: Secondary | ICD-10-CM | POA: Diagnosis not present

## 2020-11-08 DIAGNOSIS — Z20822 Contact with and (suspected) exposure to covid-19: Secondary | ICD-10-CM | POA: Diagnosis not present

## 2020-12-13 ENCOUNTER — Ambulatory Visit: Payer: Medicare HMO

## 2020-12-29 DIAGNOSIS — M0589 Other rheumatoid arthritis with rheumatoid factor of multiple sites: Secondary | ICD-10-CM | POA: Diagnosis not present

## 2020-12-29 DIAGNOSIS — Z79899 Other long term (current) drug therapy: Secondary | ICD-10-CM | POA: Diagnosis not present

## 2020-12-29 DIAGNOSIS — Z681 Body mass index (BMI) 19 or less, adult: Secondary | ICD-10-CM | POA: Diagnosis not present

## 2021-01-10 ENCOUNTER — Other Ambulatory Visit: Payer: Self-pay

## 2021-01-10 ENCOUNTER — Ambulatory Visit (INDEPENDENT_AMBULATORY_CARE_PROVIDER_SITE_OTHER): Payer: Medicare HMO

## 2021-01-10 DIAGNOSIS — E7800 Pure hypercholesterolemia, unspecified: Secondary | ICD-10-CM | POA: Diagnosis not present

## 2021-01-10 LAB — LIPID PANEL
Cholesterol: 213 mg/dL — ABNORMAL HIGH (ref 0–200)
HDL: 46.2 mg/dL (ref >39.00)
LDL Cholesterol: 144 mg/dL — ABNORMAL HIGH (ref 0–99)
NonHDL: 166.79
Total CHOL/HDL Ratio: 5
Triglycerides: 114 mg/dL (ref 0.0–149.0)
VLDL: 22.8 mg/dL (ref 0.0–40.0)

## 2021-02-19 ENCOUNTER — Other Ambulatory Visit: Payer: Self-pay | Admitting: Family Medicine

## 2021-03-23 ENCOUNTER — Encounter: Payer: Self-pay | Admitting: Family Medicine

## 2021-03-23 ENCOUNTER — Other Ambulatory Visit: Payer: Self-pay

## 2021-03-23 ENCOUNTER — Ambulatory Visit (INDEPENDENT_AMBULATORY_CARE_PROVIDER_SITE_OTHER): Payer: Medicare HMO | Admitting: Family Medicine

## 2021-03-23 VITALS — BP 155/92 | HR 99 | Temp 97.5°F | Wt 141.4 lb

## 2021-03-23 DIAGNOSIS — R030 Elevated blood-pressure reading, without diagnosis of hypertension: Secondary | ICD-10-CM | POA: Diagnosis not present

## 2021-03-23 DIAGNOSIS — Z23 Encounter for immunization: Secondary | ICD-10-CM

## 2021-03-23 DIAGNOSIS — E7800 Pure hypercholesterolemia, unspecified: Secondary | ICD-10-CM | POA: Diagnosis not present

## 2021-03-23 LAB — HEPATIC FUNCTION PANEL
ALT: 21 U/L (ref 0–53)
AST: 23 U/L (ref 0–37)
Albumin: 4.1 g/dL (ref 3.5–5.2)
Alkaline Phosphatase: 79 U/L (ref 39–117)
Bilirubin, Direct: 0.3 mg/dL (ref 0.0–0.3)
Total Bilirubin: 1.5 mg/dL — ABNORMAL HIGH (ref 0.2–1.2)
Total Protein: 6.7 g/dL (ref 6.0–8.3)

## 2021-03-23 LAB — LIPID PANEL
Cholesterol: 143 mg/dL (ref 0–200)
HDL: 54.9 mg/dL (ref >39.00)
LDL Cholesterol: 72 mg/dL (ref 0–99)
NonHDL: 87.93
Total CHOL/HDL Ratio: 3
Triglycerides: 78 mg/dL (ref 0.0–149.0)
VLDL: 15.6 mg/dL (ref 0.0–40.0)

## 2021-03-23 MED ORDER — ATORVASTATIN CALCIUM 20 MG PO TABS: 20.0000 mg | ORAL_TABLET | Freq: Every day | ORAL | 3 refills | Status: DC

## 2021-03-23 NOTE — Progress Notes (Signed)
OFFICE VISIT  03/23/2021  CC:  Chief Complaint  Patient presents with   Follow-up    RCI, 6 mo. Pt is fasting   HPI:    Patient is a 70 y.o. male who presents for 6 mo f/u  HLD, and elevated bp w/out dx of HTN. A/P as of last visit: "1) Vit B12 and folate deficiencies: these came up to normal with supplementation at last check. Cont 100 mcg vit b12 po qd and folate 0.4mg  bid. Check levels today.   2) Elev bp, hx of white coat HTN--->sounds like he likely has some true HTN. He is a little resistant to accepting this right now and wants to monitor bp more regularly at home to confirm before considering medication. Instructions: Check your blood pressure and heart rate daily for 2 weeks. Average the numbers and if average > 130 on top or >80 on bottom then call or return to discuss. Check lytes/cr today.   3) Health maintenance exam/Preventative health care:  Vaccines: he has declined pneumovax, Tdap, and shingrix in the past-->continues to decline today. Colon cancer screening: has never had a colonoscopy.  He is still thinking about this and plans on pursuing this year but not ready yet. Prostate cancer screening: PSA today. Hep C screening neg 2012. AAA screening->pt qualifies->ordered today. Cholesterol screening today."   INTERIM HX: Feeling wonderful, even his joints. He has longstanding dx of RA and gets Humira tx and ongoing f/u with Rheum MD, Dr. Amil Amen. He had rheum f/u 12/2020 and says he got labs at that time and all normal per his report. He had covid 10/2020, mild URI only.  HLD: last couple of lipid checks he admitted he had not been taking atorvastatin as rx'd, most recent was 3 mo ago.  Has taken atorva now for 2 months.  No side effects.  Home bp monitoring: consistently <130/80    Past Medical History:  Diagnosis Date   B12 deficiency 11/08/2010   Choledocholithiasis with acute cholangitis s/p ERCP 09/08/2013 09/12/2013   Dr Stacie Glaze [facility]    Closed  displaced fracture of shaft of fifth metacarpal bone of right hand 07/2014   Reduced/splinted by Sports Med MD.   COPD (chronic obstructive pulmonary disease) ([place])    Dr. Elsworth Soho (as of 07/2015 FEV1 stable at 52%)  F/u spirometry stable 06/2016 with Dr. Elsworth Soho.  Spiriva trial in the past--no help.  Pt declines inhaler meds, functions fine w/out COPD meds as of 06/2018.   Folate deficiency 07/2019   paresthesias->folic acid supplement started-->paresthesias resolved.   Fracture of fifth toe, right, closed 09/26/2012   Fracture of great toe, right, closed 09/2012   Nondisplaced.  Buddy taped, post-op shoe, ortho eval was done s/p initial dx in urgent care center.   GERD (gastroesophageal reflux disease)    History of syncope 12/2004   Hyperlipidemia    recommended statin 09/2020   Pneumonia 10/20/2010   Initial dx 10/17/10--CXR and CT scan. F/u CT 11/06/10 showed resolving RUL and RML pneumonia and resolving reactive mediastinal LAD.     Rheumatoid arthritis(714.0) 10/2010   RF+, CCP+; managed well with Humira as of 06/2018 (Dr. Amil Amen)   White coat syndrome without hypertension 2018   Reconfirmed 08/2019 with normal home bps    Past Surgical History:  Procedure Laterality Date   AORTIC ULTRASOUND  09/2020   Medicare screening-->NEG for aneurism.  (+prox aortic ectasia).   CHOLECYSTECTOMY N/A 09/10/2013   Procedure: LAPAROSCOPIC CHOLECYSTECTOMY, OPEN CHOLECYSTECTOMY WITH INTRAOPERATIVE CHOLANGIOGRAM;  Surgeon:  Luella Cook III, MD;  Location: [facility];  Service: General;  Laterality: N/A;   ERCP N/A 09/08/2013   Procedure: ENDOSCOPIC RETROGRADE CHOLANGIOPANCREATOGRAPHY (ERCP);  Surgeon: Arta Silence, MD;  Location: [facility] END[facility]COPY;  Service: Endoscopy;  Laterality: N/A;   INGUINAL HERNIA REPAIR     age 23    Outpatient Medications Prior to Visit  Medication Sig Dispense Refill   adalimumab (HUMIRA PEN) 40 MG/0.8ML injection Inject 0.8 mLs (40 mg total) into the skin every 14 (fourteen) days.  0   Folic  Acid (FOLATE PO) Take 0.4 mg by mouth in the morning and at bedtime.     vitamin B-12 (CYANOCOBALAMIN) 100 MCG tablet Take 1 tablet (100 mcg total) by mouth daily. 30 tablet 0   atorvastatin (LIPITOR) 20 MG tablet Take 1 tablet (20 mg total) by mouth at bedtime. 30 tablet 2   doxycycline (VIBRAMYCIN) 100 MG capsule Take 1 capsule (100 mg total) by mouth 2 (two) times daily. TAKE WITH FOOD (Patient not taking: Reported on 03/23/2021) 20 capsule 0   No facility-administered medications prior to visit.    No Known Allergies  ROS As per HPI  PE: Vitals with BMI 03/23/2021 10/26/2020 10/26/2020  Height - - -  Weight 141 lbs 6 oz - -  BMI - - -  Systolic [phone]  Diastolic 92 357 98  Pulse 99 - 94   Gen: Alert, well appearing.  Patient is oriented to person, place, time, and situation. AFFECT: pleasant, lucid thought and speech. CV: RRR, no m/r/g.   LUNGS: CTA bilat, nonlabored resps, good aeration in all lung fields. EXT: no clubbing or cyanosis.  no edema.   LABS:  Lab Results  Component Value Date   TSH 1.89 08/21/2019   Lab Results  Component Value Date   WBC 5.0 09/20/2020   HGB 16.0 09/20/2020   HCT 46.0 09/20/2020   MCV 87.8 09/20/2020   PLT 171 09/20/2020   Lab Results  Component Value Date   CREATININE 1.00 09/20/2020   BUN 10 09/20/2020   NA 143 09/20/2020   K 3.8 09/20/2020   CL 103 09/20/2020   CO2 28 09/20/2020   Lab Results  Component Value Date   ALT 14 09/20/2020   AST 18 09/20/2020   ALKPHOS 75 01/07/2019   BILITOT 1.4 (H) 09/20/2020   Lab Results  Component Value Date   CHOL 213 (H) 01/10/2021   Lab Results  Component Value Date   HDL 46.20 01/10/2021   Lab Results  Component Value Date   LDLCALC 144 (H) 01/10/2021   Lab Results  Component Value Date   TRIG 114.0 01/10/2021   Lab Results  Component Value Date   CHOLHDL 5 01/10/2021   Lab Results  Component Value Date   PSA 0.76 09/20/2020   PSA 0.68 05/08/2007   PSA 0.91  05/17/2006   Lab Results  Component Value Date   HGBA1C 4.2 08/21/2019   Lab Results  Component Value Date   VITAMINB12 712 09/20/2020   Lab Results  Component Value Date   FOLATE 18.4 09/20/2020    IMPRESSION AND PLAN:  1) HLD; tolerating atorva 20 qd for the last 2 mo. LDL goal <100. FLP and hepatic panel today.  2) Elev bp w/out dx htn->white coat syndrome. Cont periodic home bp monitoring.  3) Hx vit B12 def.  On oral b12 and folate supplement. Next B12 and folate level check planned approx 09/2021.  4) RA, on humira, no  sx's. Monitoring labs 12/2020 with rheum normal per pt report.  An After Visit Summary was printed and given to the patient.  FOLLOW UP: Return in about 6 months (around 09/21/2021) for annual CPE (fasting).   Signed:  Crissie Sickles, MD           03/23/2021

## 2021-03-24 ENCOUNTER — Encounter: Payer: Self-pay | Admitting: Family Medicine

## 2021-08-22 ENCOUNTER — Telehealth: Payer: Self-pay

## 2021-08-22 NOTE — Telephone Encounter (Signed)
Spoke with pt to schedule AWV in office. Patient declined to schedule wellness visit at this time.   

## 2021-08-30 ENCOUNTER — Ambulatory Visit (INDEPENDENT_AMBULATORY_CARE_PROVIDER_SITE_OTHER): Payer: Medicare HMO

## 2021-08-30 ENCOUNTER — Other Ambulatory Visit: Payer: Self-pay

## 2021-08-30 DIAGNOSIS — Z0000 Encounter for general adult medical examination without abnormal findings: Secondary | ICD-10-CM

## 2021-08-30 NOTE — Patient Instructions (Signed)
George Marshall , ?Thank you for taking time to come for your Medicare Wellness Visit. I appreciate your ongoing commitment to your health goals. Please review the following plan we discussed and let me know if I can assist you in the future.  ? ?Screening recommendations/referrals: ?Colonoscopy: Pt declined at this time  ?Recommended yearly ophthalmology/optometry visit for glaucoma screening and checkup ?Recommended yearly dental visit for hygiene and checkup ? ?Vaccinations: ?Influenza vaccine: Done 03/23/21 repeat every year  ?Pneumococcal vaccine: Declined at this time  ?Tdap vaccine: declined  ?Shingles vaccine: Shingrix discussed. Please contact your pharmacy for coverage information.    ?Covid-19: Completed 2/1, 3/1, 05/25/20 ? ?Advanced directives: Please bring a copy of your health care power of attorney and living will to the office at your convenience. ? ?Conditions/risks identified: None at this time  ? ?Next appointment: Follow up in one year for your annual wellness visit.  ? ?Preventive Care 67 Years and Older, Male ?Preventive care refers to lifestyle choices and visits with your health care provider that can promote health and wellness. ?What does preventive care include? ?A yearly physical exam. This is also called an annual well check. ?Dental exams once or twice a year. ?Routine eye exams. Ask your health care provider how often you should have your eyes checked. ?Personal lifestyle choices, including: ?Daily care of your teeth and gums. ?Regular physical activity. ?Eating a healthy diet. ?Avoiding tobacco and drug use. ?Limiting alcohol use. ?Practicing safe sex. ?Taking low doses of aspirin every day. ?Taking vitamin and mineral supplements as recommended by your health care provider. ?What happens during an annual well check? ?The services and screenings done by your health care provider during your annual well check will depend on your age, overall health, lifestyle risk factors, and family  history of disease. ?Counseling  ?Your health care provider may ask you questions about your: ?Alcohol use. ?Tobacco use. ?Drug use. ?Emotional well-being. ?Home and relationship well-being. ?Sexual activity. ?Eating habits. ?History of falls. ?Memory and ability to understand (cognition). ?Work and work Statistician. ?Screening  ?You may have the following tests or measurements: ?Height, weight, and BMI. ?Blood pressure. ?Lipid and cholesterol levels. These may be checked every 5 years, or more frequently if you are over 16 years old. ?Skin check. ?Lung cancer screening. You may have this screening every year starting at age 21 if you have a 30-pack-year history of smoking and currently smoke or have quit within the past 15 years. ?Fecal occult blood test (FOBT) of the stool. You may have this test every year starting at age 58. ?Flexible sigmoidoscopy or colonoscopy. You may have a sigmoidoscopy every 5 years or a colonoscopy every 10 years starting at age 101. ?Prostate cancer screening. Recommendations will vary depending on your family history and other risks. ?Hepatitis C blood test. ?Hepatitis B blood test. ?Sexually transmitted disease (STD) testing. ?Diabetes screening. This is done by checking your blood sugar (glucose) after you have not eaten for a while (fasting). You may have this done every 1-3 years. ?Abdominal aortic aneurysm (AAA) screening. You may need this if you are a current or former smoker. ?Osteoporosis. You may be screened starting at age 22 if you are at high risk. ?Talk with your health care provider about your test results, treatment options, and if necessary, the need for more tests. ?Vaccines  ?Your health care provider may recommend certain vaccines, such as: ?Influenza vaccine. This is recommended every year. ?Tetanus, diphtheria, and acellular pertussis (Tdap, Td) vaccine. You may need  a Td booster every 10 years. ?Zoster vaccine. You may need this after age 70. ?Pneumococcal  13-valent conjugate (PCV13) vaccine. One dose is recommended after age 51. ?Pneumococcal polysaccharide (PPSV23) vaccine. One dose is recommended after age 70. ?Talk to your health care provider about which screenings and vaccines you need and how often you need them. ?This information is not intended to replace advice given to you by your health care provider. Make sure you discuss any questions you have with your health care provider. ?Document Released: 07/01/2015 Document Revised: 02/22/2016 Document Reviewed: 04/05/2015 ?Elsevier Interactive Patient Education ? 2017 [place]. ? ?Fall Prevention in the Home ?Falls can cause injuries. They can happen to people of all ages. There are many things you can do to make your home safe and to help prevent falls. ?What can I do on the outside of my home? ?Regularly fix the edges of walkways and driveways and fix any cracks. ?Remove anything that might make you trip as you walk through a door, such as a raised step or threshold. ?Trim any bushes or trees on the path to your home. ?Use bright outdoor lighting. ?Clear any walking paths of anything that might make someone trip, such as rocks or tools. ?Regularly check to see if handrails are loose or broken. Make sure that both sides of any steps have handrails. ?Any raised decks and porches should have guardrails on the edges. ?Have any leaves, snow, or ice cleared regularly. ?Use sand or salt on walking paths during winter. ?Clean up any spills in your garage right away. This includes oil or grease spills. ?What can I do in the bathroom? ?Use night lights. ?Install grab bars by the toilet and in the tub and shower. Do not use towel bars as grab bars. ?Use non-skid mats or decals in the tub or shower. ?If you need to sit down in the shower, use a plastic, non-slip stool. ?Keep the floor dry. Clean up any water that spills on the floor as soon as it happens. ?Remove soap buildup in the tub or shower regularly. ?Attach  bath mats securely with double-sided non-slip rug tape. ?Do not have throw rugs and other things on the floor that can make you trip. ?What can I do in the bedroom? ?Use night lights. ?Make sure that you have a light by your bed that is easy to reach. ?Do not use any sheets or blankets that are too big for your bed. They should not hang down onto the floor. ?Have a firm chair that has side arms. You can use this for support while you get dressed. ?Do not have throw rugs and other things on the floor that can make you trip. ?What can I do in the kitchen? ?Clean up any spills right away. ?Avoid walking on wet floors. ?Keep items that you use a lot in easy-to-reach places. ?If you need to reach something above you, use a strong step stool that has a grab bar. ?Keep electrical cords out of the way. ?Do not use floor polish or wax that makes floors slippery. If you must use wax, use non-skid floor wax. ?Do not have throw rugs and other things on the floor that can make you trip. ?What can I do with my stairs? ?Do not leave any items on the stairs. ?Make sure that there are handrails on both sides of the stairs and use them. Fix handrails that are broken or loose. Make sure that handrails are as long as the stairways. ?Check  any carpeting to make sure that it is firmly attached to the stairs. Fix any carpet that is loose or worn. ?Avoid having throw rugs at the top or bottom of the stairs. If you do have throw rugs, attach them to the floor with carpet tape. ?Make sure that you have a light switch at the top of the stairs and the bottom of the stairs. If you do not have them, ask someone to add them for you. ?What else can I do to help prevent falls? ?Wear shoes that: ?Do not have high heels. ?Have rubber bottoms. ?Are comfortable and fit you well. ?Are closed at the toe. Do not wear sandals. ?If you use a stepladder: ?Make sure that it is fully opened. Do not climb a closed stepladder. ?Make sure that both sides of the  stepladder are locked into place. ?Ask someone to hold it for you, if possible. ?Clearly mark and make sure that you can see: ?Any grab bars or handrails. ?First and last steps. ?Where the edge of each step is. ?Use t

## 2021-08-30 NOTE — Progress Notes (Signed)
Virtual Visit via Telephone Note ? ?I connected with  George Marshall on 08/30/21 at 11:00 AM EDT by telephone and verified that I am speaking with the correct person using two identifiers. ? ?Medicare Annual Wellness visit completed telephonically due to Covid-19 pandemic.  ? ?Persons participating in this call: This Health Coach and this patient.  ? ?Location: ?Patient: Home ?Provider: Cleora Fleet ?  ?I discussed the limitations, risks, security and privacy concerns of performing an evaluation and management service by telephone and the availability of in person appointments. The patient expressed understanding and agreed to proceed. ? ?Unable to perform video visit due to video visit attempted and failed and/or patient does not have video capability.  ? ?Some vital signs may be absent or patient reported.  ? ?George Brace, George Marshall ? ? ?Subjective:  ? George Marshall is a 71 y.o. male who presents for Medicare Annual/Subsequent preventive examination. ? ?Review of Systems    ? ?Cardiac Risk Factors include: advanced age (>34mn, >>50women);hypertension;dyslipidemia;male gender ? ?   ?Objective:  ?  ?There were no vitals filed for this visit. ?There is no height or weight on file to calculate BMI. ? ?Advanced Directives 08/30/2021 08/24/2020 01/07/2019 07/28/2015 08/23/2014 09/08/2013 09/07/2013  ?Does Patient Have a Medical Advance Directive? Yes Yes Yes No No - Patient does not have advance directive;Patient would not like information  ?Type of AParamedicof [place]oot[place] will [place] will - - - - -  ?Does patient want to make changes to medical advance directive? - - No - Patient declined - - - -  ?Copy of HSleepy [place] Chart? No - copy requested No - copy requested - - - - -  ?Would patient like information on creating a medical advance directive? - - - - No - patient declined information - -  ?Pre-existing out of facility DNR order (yellow form or pink  MOST form) - - - - - No -  ? ? ?Current Medications (verified) ?Outpatient Encounter Medications as of 08/30/2021  ?Medication Sig  ? adalimumab (HUMIRA PEN) 40 MG/0.8ML injection Inject 0.8 mLs (40 mg total) into the skin every 14 (fourteen) days.  ? Ascorbic Acid (VITAMIN C) 500 MG CHEW 1 tablet  ? atorvastatin (LIPITOR) 20 MG tablet Take 1 tablet (20 mg total) by mouth at bedtime.  ? Folic Acid (FOLATE PO) Take 0.4 mg by mouth in the morning and at bedtime.  ? vitamin B-12 (CYANOCOBALAMIN) 100 MCG tablet Take 1 tablet (100 mcg total) by mouth daily.  ? ?No facility-administered encounter medications on file as of 08/30/2021.  ? ? ?Allergies (verified) ?Patient has no known allergies.  ? ?History: ?Past Medical History:  ?Diagnosis Date  ? B12 deficiency 11/08/2010  ? Choledocholithiasis with acute cholangitis s/p ERCP 09/08/2013 09/12/2013  ? Dr OStacie GlazeGI   ? Closed displaced fracture of shaft of fifth metacarpal bone of right hand 07/2014  ? Reduced/splinted by Sports Med MD.  ? COPD (chronic obstructive pulmonary disease) ([place]   ? Dr. AElsworth Soho(as of 07/2015 FEV1 stable at 52%)  F/u spirometry stable 06/2016 with Dr. AElsworth Soho  Spiriva trial in the past--no help.  Pt declines inhaler meds, functions fine w/out COPD meds as of 06/2018.  ? Folate deficiency 07/2019  ? paresthesias->folic acid supplement started-->paresthesias resolved.  ? Fracture of fifth toe, right, closed 09/26/2012  ? Fracture of great toe, right, closed 09/2012  ? Nondisplaced.  Buddy taped, post-op shoe,  ortho eval was done s/p initial dx in urgent care center.  ? GERD (gastroesophageal reflux disease)   ? Rosanna Randy syndrome   ? History of syncope 12/2004  ? Hyperlipidemia   ? recommended statin 09/2020  ? Pneumonia 10/20/2010  ? Initial dx 10/17/10--CXR and CT scan. F/u CT 11/06/10 showed resolving RUL and RML pneumonia and resolving reactive mediastinal LAD.    ? Rheumatoid arthritis(714.0) 10/2010  ? RF+, CCP+; managed well with Humira as of 06/2018  (Dr. Amil Amen)  ? White coat syndrome without hypertension 2018  ? Reconfirmed 08/2019 with normal home bps  ? ?Past Surgical History:  ?Procedure Laterality Date  ? AORTIC ULTRASOUND  09/2020  ? Medicare screening-->NEG for aneurism.  (+prox aortic ectasia).  ? CHOLECYSTECTOMY N/A 09/10/2013  ? Procedure: LAPAROSCOPIC CHOLECYSTECTOMY, OPEN CHOLECYSTECTOMY WITH INTRAOPERATIVE CHOLANGIOGRAM;  Surgeon: Merrie Roof, MD;  Location: [facility];  Service: General;  Laterality: N/A;  ? ERCP N/A 09/08/2013  ? Procedure: ENDOSCOPIC RETROGRADE CHOLANGIOPANCREATOGRAPHY (ERCP);  Surgeon: Arta Silence, MD;  Location[facility] ENDOSCOPY;  Service: Endoscopy;  Laterality: N/A;  ? INGUINAL HERNIA REPAIR    ? age 44  ? ?Family History  ?Problem Relation Age of Onset  ? Breast cancer Mother 53  ? Ovarian cancer Mother 76  ? Skin cancer Mother   ? Pneumonia Father   ? Breast cancer Maternal Aunt   ?     dx in her 52s  ? Breast cancer Maternal Aunt 41  ? Breast cancer Maternal Aunt 10  ? Prostate cancer Cousin   ?     double first cousin  ? Hypertension Other   ?     parents late 21's  ? Breast cancer Paternal Aunt   ? ?Social History  ? ?Socioeconomic History  ? Marital status: Divorced  ?  Spouse name: Not on file  ? Number of children: Not on file  ? Years of education: Not on file  ? Highest education level: Not on file  ?Occupational History  ? Occupation: retired  ?Tobacco Use  ? Smoking status: Former  ?  Packs/day: 1.50  ?  Years: 30.00  ?  Pack years: 45.00  ?  Types: Cigarettes  ?  Quit date: 06/18/1998  ?  Years since quitting: 23.2  ? Smokeless tobacco: Current  ?  Types: Chew  ?Vaping Use  ? Vaping Use: Never used  ?Substance and Sexual Activity  ? Alcohol use: No  ? Drug use: No  ? Sexual activity: Not on file  ?Other Topics Concern  ? Not on file  ?Social History Narrative  ? Divorced, 2 step children.  ? Has live-in girlfriend.  ? 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  ? [place] native, orig from  [place], [place].  ? Tob: 30 pack-yr hx.  Quit 2000.  ? No alcohol.  No drugs.  ? Volunteer with [employer].  ? Gardening/yard work.  ? ?Social Determinants of Health  ? ?Financial Resource Strain: Low Risk   ? Difficulty of Paying Living Expenses: Not hard at all  ?Food Insecurity: No Food Insecurity  ? Worried About Charity fundraiser in the Last Year: Never true  ? Ran Out of Food in the Last Year: Never true  ?Transportation Needs: No Transportation Needs  ? Lack of Transportation (Medical): No  ? Lack of Transportation (Non-Medical): No  ?Physical Activity: Sufficiently Active  ? Days of Exercise per Week: 3 days  ? Minutes of Exercise per  Session: 90 min  ?Stress: No Stress Concern Present  ? Feeling of Stress : Not at all  ?Social Connections: Moderately Isolated  ? Frequency of Communication with Friends and Family: More than three times a week  ? Frequency of Social Gatherings with Friends and Family: More than three times a week  ? Attends Religious Services: 1 to 4 times per year  ? Active Member of Clubs or Organizations: No  ? Attends Archivist Meetings: Never  ? Marital Status: Divorced  ? ? ?Tobacco Counseling ?Ready to quit: Not Answered ?Counseling given: Not Answered ? ? ?Clinical Intake: ? ?Pre-visit preparation completed: Yes ? ?Pain : No/denies pain ? ?  ? ?BMI - recorded: 19.72 ?Nutritional Status: BMI of 19-24  Normal ?Nutritional Risks: None ?Diabetes: No ? ?How often do you need to have someone help you when you read instructions, pamphlets, or other written materials from your doctor or pharmacy?: 1 - Never ? ?Diabetic?No ? ?Interpreter Needed?: No ? ?Information entered by :: Charlott Rakes, George Marshall ? ? ?Activities of Daily Living ?In your present state of health, do you have any difficulty performing the following activities: 08/30/2021  ?Hearing? N  ?Vision? N  ?Difficulty concentrating or making decisions? N  ?Walking or climbing stairs? N  ?Dressing or bathing? N  ?Doing errands,  shopping? N  ?Preparing Food and eating ? N  ?Using the Toilet? N  ?In the past six months, have you accidently leaked urine? N  ?Do you have problems with loss of bowel control? N  ?Managing your Medications

## 2021-10-06 ENCOUNTER — Telehealth: Payer: Self-pay

## 2021-10-06 NOTE — Telephone Encounter (Signed)
Lmom for patient to call office to schedule his CPE. ? ?Check out notes from 03/23/21 visit - [name] ?"Return in about 6 months (around 09/21/2021) for annual CPE (fasting)." ?

## 2022-01-02 DIAGNOSIS — Z681 Body mass index (BMI) 19 or less, adult: Secondary | ICD-10-CM | POA: Diagnosis not present

## 2022-01-02 DIAGNOSIS — Z79899 Other long term (current) drug therapy: Secondary | ICD-10-CM | POA: Diagnosis not present

## 2022-01-02 DIAGNOSIS — M0589 Other rheumatoid arthritis with rheumatoid factor of multiple sites: Secondary | ICD-10-CM | POA: Diagnosis not present

## 2022-04-11 ENCOUNTER — Other Ambulatory Visit: Payer: Self-pay | Admitting: Family Medicine

## 2022-05-03 ENCOUNTER — Other Ambulatory Visit: Payer: Self-pay | Admitting: Family Medicine

## 2022-05-07 ENCOUNTER — Other Ambulatory Visit: Payer: Self-pay | Admitting: Family Medicine

## 2022-06-06 ENCOUNTER — Telehealth: Payer: Self-pay | Admitting: Family Medicine

## 2022-06-06 MED ORDER — ATORVASTATIN CALCIUM 20 MG PO TABS: ORAL_TABLET | ORAL | 0 refills | Status: DC

## 2022-06-06 NOTE — Telephone Encounter (Signed)
Pt advised refill sent. °

## 2022-06-06 NOTE — Telephone Encounter (Signed)
Prescription Request  06/06/2022  Is this a "Controlled Substance" medicine? No  LOV: Visit date not found  What is the name of the medication or equipment? atorvastatin (LIPITOR) 20 MG tablet   Have you contacted your pharmacy to request a refill? No    [id number] [address], [place] - [address]Phone: [phone]: [phone]   Patient notified that their request is being sent to the clinical staff for review and that they should receive a response within 2 business days.   Please advise at [phone]

## 2022-06-26 ENCOUNTER — Encounter: Payer: Self-pay | Admitting: Family Medicine

## 2022-06-26 ENCOUNTER — Ambulatory Visit (INDEPENDENT_AMBULATORY_CARE_PROVIDER_SITE_OTHER): Payer: Medicare HMO | Admitting: Family Medicine

## 2022-06-26 VITALS — BP 181/76 | HR 98 | Temp 98.0°F | Ht 71.0 in | Wt 142.6 lb

## 2022-06-26 DIAGNOSIS — Z125 Encounter for screening for malignant neoplasm of prostate: Secondary | ICD-10-CM | POA: Diagnosis not present

## 2022-06-26 DIAGNOSIS — Z23 Encounter for immunization: Secondary | ICD-10-CM | POA: Diagnosis not present

## 2022-06-26 DIAGNOSIS — Z1211 Encounter for screening for malignant neoplasm of colon: Secondary | ICD-10-CM | POA: Diagnosis not present

## 2022-06-26 DIAGNOSIS — I10 Essential (primary) hypertension: Secondary | ICD-10-CM

## 2022-06-26 DIAGNOSIS — R030 Elevated blood-pressure reading, without diagnosis of hypertension: Secondary | ICD-10-CM

## 2022-06-26 DIAGNOSIS — E7800 Pure hypercholesterolemia, unspecified: Secondary | ICD-10-CM | POA: Diagnosis not present

## 2022-06-26 DIAGNOSIS — Z0000 Encounter for general adult medical examination without abnormal findings: Secondary | ICD-10-CM | POA: Diagnosis not present

## 2022-06-26 DIAGNOSIS — E538 Deficiency of other specified B group vitamins: Secondary | ICD-10-CM | POA: Diagnosis not present

## 2022-06-26 LAB — PSA, MEDICARE: PSA: 0.8 ng/ml (ref 0.10–4.00)

## 2022-06-26 MED ORDER — ATORVASTATIN CALCIUM 20 MG PO TABS: ORAL_TABLET | ORAL | 3 refills | Status: DC

## 2022-06-26 MED ORDER — HYDROCHLOROTHIAZIDE 12.5 MG PO CAPS: 12.5000 mg | ORAL_CAPSULE | Freq: Every day | ORAL | 0 refills | Status: DC

## 2022-06-26 NOTE — Progress Notes (Signed)
Office Note 06/26/2022  CC:  Chief Complaint  Patient presents with   Medical Management of Chronic Issues    Pt is fasting    HPI:  Patient is a 72 y.o. male who is here for annual health maintenance exam and follow-up folate deficiency, vitamin B12 deficiency, and hyperlipidemia..  Feeling well. Still on Humira, followed by rheumatology, no joint or muscle problems. Taking B12 and folate as prescribed.  Takes blood pressure at home about once weekly and typically is in the 599J to 570V systolic. He has had past history of whitecoat syndrome but it appears now he has emerging chronic hypertension.  Past Medical History:  Diagnosis Date   B12 deficiency 11/08/2010   Choledocholithiasis with acute cholangitis s/p ERCP 09/08/2013 09/12/2013   Dr Stacie Glaze GI    Closed displaced fracture of shaft of fifth metacarpal bone of right hand 07/2014   Reduced/splinted by Sports Med MD.   COPD (chronic obstructive pulmonary disease) ([place])    Dr. Elsworth Soho (as of 07/2015 FEV1 stable at 52%)  F/u spirometry stable 06/2016 with Dr. Elsworth Soho.  Spiriva trial in the past--no help.  Pt declines inhaler meds, functions fine w/out COPD meds as of 06/2018.   Folate deficiency 07/2019   paresthesias->folic acid supplement started-->paresthesias resolved.   Fracture of fifth toe, right, closed 09/26/2012   Fracture of great toe, right, closed 09/2012   Nondisplaced.  Buddy taped, post-op shoe, ortho eval was done s/p initial dx in urgent care center.   GERD (gastroesophageal reflux disease)    Gilbert syndrome    History of syncope 12/2004   Hyperlipidemia    recommended statin 09/2020   Pneumonia 10/20/2010   Initial dx 10/17/10--CXR and CT scan. F/u CT 11/06/10 showed resolving RUL and RML pneumonia and resolving reactive mediastinal LAD.     Rheumatoid arthritis(714.0) 10/2010   RF+, CCP+; managed well with Humira as of 06/2018 (Dr. Amil Amen)   White coat syndrome without hypertension 2018   Reconfirmed  08/2019 with normal home bps    Past Surgical History:  Procedure Laterality Date   AORTIC ULTRASOUND  09/2020   Medicare screening-->NEG for aneurism.  (+prox aortic ectasia).   CHOLECYSTECTOMY N/A 09/10/2013   Procedure: LAPAROSCOPIC CHOLECYSTECTOMY, OPEN CHOLECYSTECTOMY WITH INTRAOPERATIVE CHOLANGIOGRAM;  Surgeon: Merrie Roof, MD;  Location: [facility];  Service: General;  Laterality: N/A;   ERCP N/A 09/08/2013   Procedure: ENDOSCOPIC RETROGRADE CHOLANGIOPANCREATOGRAPHY (ERCP);  Surgeon: Arta Silence, MD;  Location: [facility] ENDOSCOPY;  Service: Endoscopy;  Laterality: N/A;   INGUINAL HERNIA REPAIR     age 80    Family History  Problem Relation Age of Onset   Breast cancer Mother 19   Ovarian cancer Mother 87   Skin cancer Mother    Pneumonia Father    Breast cancer Maternal Aunt        dx in her 26s   Breast cancer Maternal Aunt 70   Breast cancer Maternal Aunt 61   Prostate cancer Cousin        double first cousin   Hypertension Other        parents late 13's   Breast cancer Paternal Aunt     Social History   Socioeconomic History   Marital status: Divorced    Spouse name: Not on file   Number of children: Not on file   Years of education: Not on file   Highest education level: Not on file  Occupational History   Occupation: retired  Tobacco Use  Smoking status: Former    Packs/day: 1.50    Years: 30.00    Total pack years: 45.00    Types: Cigarettes    Quit date: 06/18/1998    Years since quitting: 24.0   Smokeless tobacco: Current    Types: Chew  Vaping Use   Vaping Use: Never used  Substance and Sexual Activity   Alcohol use: No   Drug use: No   Sexual activity: Not on file  Other Topics Concern   Not on file  Social History Narrative   Divorced, 2 step children.   Has live-in girlfriend.   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   [place] native, orig from [place], [place].   Tob: 30 pack-yr hx.  Quit 2000.   No alcohol.  No  drugs.   Volunteer with [employer].   Gardening/yard work.   Social Determinants of Health   Financial Resource Strain: Low Risk  (08/30/2021)   Overall Financial Resource Strain (CARDIA)    Difficulty of Paying Living Expenses: Not hard at all  Food Insecurity: No Food Insecurity (08/30/2021)   Hunger Vital Sign    Worried About Running Out of Food in the Last Year: Never true    Ran Out of Food in the Last Year: Never true  Transportation Needs: No Transportation Needs (08/30/2021)   PRAPARE - Hydrologist (Medical): No    Lack of Transportation (Non-Medical): No  Physical Activity: Sufficiently Active (08/30/2021)   Exercise Vital Sign    Days of Exercise per Week: 3 days    Minutes of Exercise per Session: 90 min  Stress: No Stress Concern Present (08/30/2021)   [place]    Feeling of Stress : Not at all  Social Connections: Moderately Isolated (08/30/2021)   Social Connection and Isolation Panel [NHANES]    Frequency of Communication with Friends and Family: More than three times a week    Frequency of Social Gatherings with Friends and Family: More than three times a week    Attends Religious Services: 1 to 4 times per year    Active Member of [employer] or Organizations: No    Attends Archivist Meetings: Never    Marital Status: Divorced  Human resources officer Violence: Not At Risk (08/30/2021)   Humiliation, Afraid, Rape, and Kick questionnaire    Fear of Current or Ex-Partner: No    Emotionally Abused: No    Physically Abused: No    Sexually Abused: No    Outpatient Medications Prior to Visit  Medication Sig Dispense Refill   adalimumab (HUMIRA PEN) 40 MG/0.8ML injection Inject 0.8 mLs (40 mg total) into the skin every 14 (fourteen) days.  0   Ascorbic Acid (VITAMIN C) 500 MG CHEW 1 tablet     Folic Acid (FOLATE PO) Take 0.4 mg by mouth in the morning and at bedtime.      vitamin B-12 (CYANOCOBALAMIN) 100 MCG tablet Take 1 tablet (100 mcg total) by mouth daily. 30 tablet 0   atorvastatin (LIPITOR) 20 MG tablet TAKE 1 TABLET BY MOUTH EVERYDAY AT BEDTIME 30 tablet 0   No facility-administered medications prior to visit.    No Known Allergies  Review of Systems  Constitutional:  Negative for appetite change, chills, fatigue and fever.  HENT:  Negative for congestion, dental problem, ear pain and sore throat.   Eyes:  Negative for discharge, redness and visual disturbance.  Respiratory:  Negative for cough, chest tightness, shortness of breath and wheezing.   Cardiovascular:  Negative for chest pain, palpitations and leg swelling.  Gastrointestinal:  Negative for abdominal pain, blood in stool, diarrhea, nausea and vomiting.  Genitourinary:  Negative for difficulty urinating, dysuria, flank pain, frequency, hematuria and urgency.  Musculoskeletal:  Negative for arthralgias, back pain, joint swelling, myalgias and neck stiffness.  Skin:  Negative for pallor and rash.  Neurological:  Negative for dizziness, speech difficulty, weakness and headaches.  Hematological:  Negative for adenopathy. Does not bruise/bleed easily.  Psychiatric/Behavioral:  Negative for confusion and sleep disturbance. The patient is not nervous/anxious.     PE;    06/26/2022   10:37 AM 06/26/2022   10:26 AM 03/23/2021    9:29 AM  Vitals with BMI  Height  '5\' 11"'$    Weight  142 lbs 10 oz 141 lbs 6 oz  BMI  03.2   Systolic 122 482 500  Diastolic 76 79 92  Pulse  98 99   Gen: Alert, well appearing.  Patient is oriented to person, place, time, and situation. AFFECT: pleasant, lucid thought and speech. ENT: Ears: EACs clear, normal epithelium.  TMs with good light reflex and landmarks bilaterally.  Eyes: no injection, icteris, swelling, or exudate.  EOMI, PERRLA. Nose: no drainage or turbinate edema/swelling.  No injection or focal lesion.  Mouth: lips without lesion/swelling.  Oral mucosa  pink and moist.  Dentition intact and without obvious caries or gingival swelling.  Oropharynx without erythema, exudate, or swelling.  Neck: supple/nontender.  No LAD, mass, or TM.  Carotid pulses 2+ bilaterally, without bruits. CV: RRR, no m/r/g.   LUNGS: CTA bilat, nonlabored resps, good aeration in all lung fields. ABD: soft, NT, ND, BS normal.  No hepatospenomegaly or mass.  No bruits. EXT: no clubbing, cyanosis, or edema.  Musculoskeletal: no joint swelling, erythema, warmth, or tenderness.  ROM of all joints intact. Skin - no sores or suspicious lesions or rashes or color changes  Pertinent labs:  Lab Results  Component Value Date   TSH 1.89 08/21/2019   Lab Results  Component Value Date   WBC 5.0 09/20/2020   HGB 16.0 09/20/2020   HCT 46.0 09/20/2020   MCV 87.8 09/20/2020   PLT 171 09/20/2020   Lab Results  Component Value Date   CREATININE 1.00 09/20/2020   BUN 10 09/20/2020   NA 143 09/20/2020   K 3.8 09/20/2020   CL 103 09/20/2020   CO2 28 09/20/2020   Lab Results  Component Value Date   ALT 21 03/23/2021   AST 23 03/23/2021   ALKPHOS 79 03/23/2021   BILITOT 1.5 (H) 03/23/2021   Lab Results  Component Value Date   CHOL 143 03/23/2021   Lab Results  Component Value Date   HDL 54.90 03/23/2021   Lab Results  Component Value Date   LDLCALC 72 03/23/2021   Lab Results  Component Value Date   TRIG 78.0 03/23/2021   Lab Results  Component Value Date   CHOLHDL 3 03/23/2021   Lab Results  Component Value Date   PSA 0.76 09/20/2020   PSA 0.68 05/08/2007   PSA 0.91 05/17/2006   Lab Results  Component Value Date   HGBA1C 4.2 08/21/2019   ASSESSMENT AND PLAN:   #1 health maintenance exam/Preventative health care:  Vaccines: he has declined pneumovax, Tdap, and shingrix in the past-->continues to decline today. Colon cancer screening: has never had a colonoscopy.  He is still thinking  about this and plans on pursuing this at some point but not  ready yet. Prostate cancer screening: PSA today. Hep C screening neg 2012.  #2 hypertension, time to start medication. HCTZ 12.5 mg a day prescribed. Therapeutic expectations and side effect profile of medication discussed today.  Patient's questions answered. Monitor blood pressure at home daily and write these down and we will review in 7 to 10 days in the office.  #3 folate and vitamin B12 deficiency.  He is on long-term supplement. Check B12 and folate levels today.  #4 hyperlipidemia, doing well on atorvastatin 20 mg a day. Lipid panel and hepatic panel today.  An After Visit Summary was printed and given to the patient.  FOLLOW UP:  Return for 7-10 days hypertension.  Signed:  Crissie Sickles, MD           06/26/2022

## 2022-06-27 LAB — COMPREHENSIVE METABOLIC PANEL
AG Ratio: 1.5 (calc) (ref 1.0–2.5)
ALT: 16 U/L (ref 9–46)
AST: 14 U/L (ref 10–35)
Albumin: 4 g/dL (ref 3.6–5.1)
Alkaline phosphatase (APISO): 95 U/L (ref 35–144)
BUN: 12 mg/dL (ref 7–25)
CO2: 31 mmol/L (ref 20–32)
Calcium: 8.6 mg/dL (ref 8.6–10.3)
Chloride: 103 mmol/L (ref 98–110)
Creat: 0.91 mg/dL (ref 0.70–1.35)
Globulin: 2.7 g/dL (calc) (ref 1.9–3.7)
Glucose, Bld: 103 mg/dL — ABNORMAL HIGH (ref 65–99)
Potassium: 3.4 mmol/L — ABNORMAL LOW (ref 3.5–5.3)
Sodium: 143 mmol/L (ref 135–146)
Total Bilirubin: 2 mg/dL — ABNORMAL HIGH (ref 0.2–1.2)
Total Protein: 6.7 g/dL (ref 6.1–8.1)

## 2022-06-27 LAB — LIPID PANEL
Cholesterol: 130 mg/dL (ref <200)
HDL: 48 mg/dL (ref >=40)
LDL Cholesterol (Calc): 65 mg/dL (calc)
Non-HDL Cholesterol (Calc): 82 mg/dL (calc) (ref <130)
Total CHOL/HDL Ratio: 2.7 (calc) (ref <5.0)
Triglycerides: 86 mg/dL (ref <150)

## 2022-06-27 LAB — FOLATE: Folate: 21.7 ng/mL

## 2022-06-27 LAB — VITAMIN B12: Vitamin B-12: 683 pg/mL (ref 200–1100)

## 2022-06-28 ENCOUNTER — Telehealth: Payer: Self-pay

## 2022-06-28 NOTE — Telephone Encounter (Signed)
Patient returning call about lab results.    Per Dr. Anitra Lauth - no new recommendations.  Patient aware - no questions/concerns at this time.

## 2022-06-28 NOTE — Telephone Encounter (Signed)
noted 

## 2022-07-10 ENCOUNTER — Ambulatory Visit (INDEPENDENT_AMBULATORY_CARE_PROVIDER_SITE_OTHER): Payer: Medicare HMO | Admitting: Family Medicine

## 2022-07-10 ENCOUNTER — Encounter: Payer: Self-pay | Admitting: Family Medicine

## 2022-07-10 VITALS — BP 133/74 | HR 95 | Temp 97.6°F | Ht 71.0 in | Wt 139.0 lb

## 2022-07-10 DIAGNOSIS — I10 Essential (primary) hypertension: Secondary | ICD-10-CM | POA: Diagnosis not present

## 2022-07-10 LAB — BASIC METABOLIC PANEL
BUN: 15 mg/dL (ref 6–23)
CO2: 34 mEq/L — ABNORMAL HIGH (ref 19–32)
Calcium: 8.5 mg/dL (ref 8.4–10.5)
Chloride: 98 mEq/L (ref 96–112)
Creatinine, Ser: 1.05 mg/dL (ref 0.40–1.50)
GFR: 72.55 mL/min (ref >60.00)
Glucose, Bld: 104 mg/dL — ABNORMAL HIGH (ref 70–99)
Potassium: 3.1 mEq/L — ABNORMAL LOW (ref 3.5–5.1)
Sodium: 140 mEq/L (ref 135–145)

## 2022-07-10 MED ORDER — HYDROCHLOROTHIAZIDE 25 MG PO TABS: 25.0000 mg | ORAL_TABLET | Freq: Every day | ORAL | 0 refills | Status: DC

## 2022-07-10 NOTE — Progress Notes (Signed)
OFFICE VISIT  07/10/2022  CC:  Chief Complaint  Patient presents with   Medical Management of Chronic Issues    Hypertension follow up; pt is fasting    Patient is a 72 y.o. male who presents for 2-week follow-up hypertension. A/P as of last visit: "hypertension, time to start medication. HCTZ 12.5 mg a day prescribed. Therapeutic expectations and side effect profile of medication discussed today.  Patient's questions answered. Monitor blood pressure at home daily and write these down and we will review in 7 to 10 days in the office"  INTERIM HX: Feeling well, no adverse effects from HCTZ. Reviewed home blood pressures: Range 419-622 systolic over 29-79 diastolic.   He has some blood pressures well within normal but others in the 140s over 80s. Average heart rate 85.  Past Medical History:  Diagnosis Date   B12 deficiency 11/08/2010   Choledocholithiasis with acute cholangitis s/p ERCP 09/08/2013 09/12/2013   Dr Stacie Glaze GI    Closed displaced fracture of shaft of fifth metacarpal bone of right hand 07/2014   Reduced/splinted by Sports Med MD.   COPD (chronic obstructive pulmonary disease) ([place])    Dr. Elsworth Soho (as of 07/2015 FEV1 stable at 52%)  F/u spirometry stable 06/2016 with Dr. Elsworth Soho.  Spiriva trial in the past--no help.  Pt declines inhaler meds, functions fine w/out COPD meds as of 06/2018.   Folate deficiency 07/2019   paresthesias->folic acid supplement started-->paresthesias resolved.   Fracture of fifth toe, right, closed 09/26/2012   Fracture of great toe, right, closed 09/2012   Nondisplaced.  Buddy taped, post-op shoe, ortho eval was done s/p initial dx in urgent care center.   GERD (gastroesophageal reflux disease)    Gilbert syndrome    History of syncope 12/2004   Hyperlipidemia    recommended statin 09/2020   Pneumonia 10/20/2010   Initial dx 10/17/10--CXR and CT scan. F/u CT 11/06/10 showed resolving RUL and RML pneumonia and resolving reactive mediastinal  LAD.     Rheumatoid arthritis(714.0) 10/2010   RF+, CCP+; managed well with Humira as of 06/2018 (Dr. Amil Amen)   White coat syndrome without hypertension 2018   Reconfirmed 08/2019 with normal home bps    Past Surgical History:  Procedure Laterality Date   AORTIC ULTRASOUND  09/2020   Medicare screening-->NEG for aneurism.  (+prox aortic ectasia).   CHOLECYSTECTOMY N/A 09/10/2013   Procedure: LAPAROSCOPIC CHOLECYSTECTOMY, OPEN CHOLECYSTECTOMY WITH INTRAOPERATIVE CHOLANGIOGRAM;  Surgeon: Merrie Roof, MD;  Location: [facility];  Service: General;  Laterality: N/A;   ERCP N/A 09/08/2013   Procedure: ENDOSCOPIC RETROGRADE CHOLANGIOPANCREATOGRAPHY (ERCP);  Surgeon: Arta Silence, MD;  Location[facility] ENDOSCOPY;  Service: Endoscopy;  Laterality: N/A;   INGUINAL HERNIA REPAIR     age 41    Outpatient Medications Prior to Visit  Medication Sig Dispense Refill   adalimumab (HUMIRA PEN) 40 MG/0.8ML injection Inject 0.8 mLs (40 mg total) into the skin every 14 (fourteen) days.  0   Ascorbic Acid (VITAMIN C) 500 MG CHEW 1 tablet     atorvastatin (LIPITOR) 20 MG tablet TAKE 1 TABLET BY MOUTH EVERYDAY AT BEDTIME 90 tablet 3   Folic Acid (FOLATE PO) Take 0.4 mg by mouth in the morning and at bedtime.     vitamin B-12 (CYANOCOBALAMIN) 100 MCG tablet Take 1 tablet (100 mcg total) by mouth daily. 30 tablet 0   hydrochlorothiazide (MICROZIDE) 12.5 MG capsule Take 1 capsule (12.5 mg total) by mouth daily. 30 capsule 0   No facility-administered  medications prior to visit.    No Known Allergies  Review of Systems As per HPI  PE:    07/10/2022    8:50 AM 06/26/2022   10:37 AM 06/26/2022   10:26 AM  Vitals with BMI  Height '5\' 11"'$   '5\' 11"'$   Weight 139 lbs  142 lbs 10 oz  BMI 01.0  93.2  Systolic 355 732 202  Diastolic 74 76 79  Pulse 95  98    Physical Exam  Gen: Alert, well appearing.  Patient is oriented to person, place, time, and situation. CV: RRR, no m/r/g.   LUNGS: CTA bilat, nonlabored  resps, good aeration in all lung fields. EXT: no clubbing or cyanosis.  no edema.    LABS:  Last metabolic panel Lab Results  Component Value Date   GLUCOSE 103 (H) 06/26/2022   NA 143 06/26/2022   K 3.4 (L) 06/26/2022   CL 103 06/26/2022   CO2 31 06/26/2022   BUN 12 06/26/2022   CREATININE 0.91 06/26/2022   GFRNONAA >60 01/07/2019   CALCIUM 8.6 06/26/2022   PROT 6.7 06/26/2022   ALBUMIN 4.1 03/23/2021   BILITOT 2.0 (H) 06/26/2022   ALKPHOS 79 03/23/2021   AST 14 06/26/2022   ALT 16 06/26/2022   ANIONGAP 10 01/07/2019   IMPRESSION AND PLAN:  Hypertension, controlled improving. Increase HCTZ to 25 mg a day. Basic metabolic panel today. Continue daily blood pressure checks.  Will review these again at follow-up in 2 weeks.  An After Visit Summary was printed and given to the patient.  FOLLOW UP: Return in about 2 weeks (around 07/24/2022) for f/u HTN.  Signed:  Crissie Sickles, MD           07/10/2022

## 2022-07-11 ENCOUNTER — Other Ambulatory Visit: Payer: Self-pay | Admitting: Family Medicine

## 2022-07-11 ENCOUNTER — Telehealth: Payer: Self-pay

## 2022-07-11 MED ORDER — POTASSIUM CHLORIDE CRYS ER 20 MEQ PO TBCR: 20.0000 meq | EXTENDED_RELEASE_TABLET | Freq: Two times a day (BID) | ORAL | 5 refills | Status: DC

## 2022-07-11 NOTE — Telephone Encounter (Signed)
-----  Message from Tammi Sou, MD sent at 07/11/2022  9:22 AM EST ----- Potassium is low. Needs to start supplement. Pls eRX Klor-con 20 mEQ tabs, 1 bid, #60, RF x 5.

## 2022-07-16 ENCOUNTER — Telehealth: Payer: Self-pay | Admitting: Family Medicine

## 2022-07-16 MED ORDER — POTASSIUM CHLORIDE CRYS ER 20 MEQ PO TBCR: 20.0000 meq | EXTENDED_RELEASE_TABLET | Freq: Two times a day (BID) | ORAL | 6 refills | Status: DC

## 2022-07-16 NOTE — Telephone Encounter (Signed)
Please review and advise.

## 2022-07-16 NOTE — Telephone Encounter (Signed)
Does his pharmacy have ANY formulation of potassium chloride?? If not, ask pt to call nearby pharmacies to check on Klor-con.

## 2022-07-16 NOTE — Telephone Encounter (Signed)
They have generic potassium

## 2022-07-16 NOTE — Telephone Encounter (Signed)
Pt advised new rx sent to pharmacy.

## 2022-07-16 NOTE — Telephone Encounter (Signed)
Pt states that the pharmacy told him the potassium is on backorder and they have no idea when to expect a shipment. Is there a different potassium that can be called in? Please advise patient.

## 2022-07-16 NOTE — Telephone Encounter (Signed)
Ok, generic rx'd

## 2022-07-24 ENCOUNTER — Ambulatory Visit (INDEPENDENT_AMBULATORY_CARE_PROVIDER_SITE_OTHER): Payer: Medicare HMO | Admitting: Family Medicine

## 2022-07-24 ENCOUNTER — Encounter: Payer: Self-pay | Admitting: Family Medicine

## 2022-07-24 VITALS — BP 135/76 | HR 90 | Temp 97.7°F | Ht 71.0 in | Wt 141.0 lb

## 2022-07-24 DIAGNOSIS — E876 Hypokalemia: Secondary | ICD-10-CM | POA: Diagnosis not present

## 2022-07-24 DIAGNOSIS — I10 Essential (primary) hypertension: Secondary | ICD-10-CM | POA: Diagnosis not present

## 2022-07-24 MED ORDER — HYDROCHLOROTHIAZIDE 25 MG PO TABS: 25.0000 mg | ORAL_TABLET | Freq: Every day | ORAL | 1 refills | Status: DC

## 2022-07-24 NOTE — Progress Notes (Signed)
OFFICE VISIT  07/24/2022  CC:  Chief Complaint  Patient presents with   Hypertension    2 week f/u; brought readings    Patient is a 72 y.o. male who presents for 2-week follow-up hypertension. Last visit I increased his HCTZ to 25 mg a day.  INTERIM HX: Potassium was 3.1 on labs at last visit, potassium supplement prescribed. Feeling well. Home blood pressures consistently less than 130/80.  Averages 431 systolic and 70 diastolic.  Heart rate average 80. No side effects from HCTZ. He has been taking the supplemental potassium with prescribed him for about 1 week now.  Past Medical History:  Diagnosis Date   B12 deficiency 11/08/2010   Choledocholithiasis with acute cholangitis s/p ERCP 09/08/2013 09/12/2013   Dr Stacie Glaze GI    Closed displaced fracture of shaft of fifth metacarpal bone of right hand 07/2014   Reduced/splinted by Sports Med MD.   COPD (chronic obstructive pulmonary disease) ([place])    Dr. Elsworth Soho (as of 07/2015 FEV1 stable at 52%)  F/u spirometry stable 06/2016 with Dr. Elsworth Soho.  Spiriva trial in the past--no help.  Pt declines inhaler meds, functions fine w/out COPD meds as of 06/2018.   Folate deficiency 07/2019   paresthesias->folic acid supplement started-->paresthesias resolved.   Fracture of fifth toe, right, closed 09/26/2012   Fracture of great toe, right, closed 09/2012   Nondisplaced.  Buddy taped, post-op shoe, ortho eval was done s/p initial dx in urgent care center.   GERD (gastroesophageal reflux disease)    Gilbert syndrome    History of syncope 12/2004   Hyperlipidemia    recommended statin 09/2020   Pneumonia 10/20/2010   Initial dx 10/17/10--CXR and CT scan. F/u CT 11/06/10 showed resolving RUL and RML pneumonia and resolving reactive mediastinal LAD.     Rheumatoid arthritis(714.0) 10/2010   RF+, CCP+; managed well with Humira as of 06/2018 (Dr. Amil Amen)   White coat syndrome without hypertension 2018   Reconfirmed 08/2019 with normal home bps     Past Surgical History:  Procedure Laterality Date   AORTIC ULTRASOUND  09/2020   Medicare screening-->NEG for aneurism.  (+prox aortic ectasia).   CHOLECYSTECTOMY N/A 09/10/2013   Procedure: LAPAROSCOPIC CHOLECYSTECTOMY, OPEN CHOLECYSTECTOMY WITH INTRAOPERATIVE CHOLANGIOGRAM;  Surgeon: Merrie Roof, MD;  Location: [facility];  Service: General;  Laterality: N/A;   ERCP N/A 09/08/2013   Procedure: ENDOSCOPIC RETROGRADE CHOLANGIOPANCREATOGRAPHY (ERCP);  Surgeon: Arta Silence, MD;  Location: [facility] ENDOSCOPY;  Service: Endoscopy;  Laterality: N/A;   INGUINAL HERNIA REPAIR     age 36    Outpatient Medications Prior to Visit  Medication Sig Dispense Refill   adalimumab (HUMIRA PEN) 40 MG/0.8ML injection Inject 0.8 mLs (40 mg total) into the skin every 14 (fourteen) days.  0   Ascorbic Acid (VITAMIN C) 500 MG CHEW 1 tablet     atorvastatin (LIPITOR) 20 MG tablet TAKE 1 TABLET BY MOUTH EVERYDAY AT BEDTIME 90 tablet 3   Folic Acid (FOLATE PO) Take 0.4 mg by mouth in the morning and at bedtime.     hydrochlorothiazide (HYDRODIURIL) 25 MG tablet Take 1 tablet (25 mg total) by mouth daily. 30 tablet 0   potassium chloride SA (KLOR-CON M) 20 MEQ tablet Take 1 tablet (20 mEq total) by mouth 2 (two) times daily. 60 tablet 6   vitamin B-12 (CYANOCOBALAMIN) 100 MCG tablet Take 1 tablet (100 mcg total) by mouth daily. 30 tablet 0   No facility-administered medications prior to visit.  No Known Allergies  Review of Systems As per HPI  PE:    07/24/2022    8:35 AM 07/10/2022    8:50 AM 06/26/2022   10:37 AM  Vitals with BMI  Height '5\' 11"'$  '5\' 11"'$    Weight 141 lbs 139 lbs   BMI 68.11 57.2   Systolic 620 355 974  Diastolic 76 74 76  Pulse 90 95      Physical Exam  Gen: Alert, well appearing.  Patient is oriented to person, place, time, and situation. AFFECT: pleasant, lucid thought and speech. CV: RRR, no m/r/g.   LUNGS: CTA bilat, nonlabored resps, good aeration in all lung fields. EXT:  no clubbing or cyanosis.  no edema.    LABS:  Last metabolic panel Lab Results  Component Value Date   GLUCOSE 104 (H) 07/10/2022   NA 140 07/10/2022   K 3.1 (L) 07/10/2022   CL 98 07/10/2022   CO2 34 (H) 07/10/2022   BUN 15 07/10/2022   CREATININE 1.05 07/10/2022   GFRNONAA >60 01/07/2019   CALCIUM 8.5 07/10/2022   PROT 6.7 06/26/2022   ALBUMIN 4.1 03/23/2021   BILITOT 2.0 (H) 06/26/2022   ALKPHOS 79 03/23/2021   AST 14 06/26/2022   ALT 16 06/26/2022   ANIONGAP 10 01/07/2019   IMPRESSION AND PLAN:  HTN, well controlled now on hctz '25mg'$  qd. Mild hypokalemia noted after starting this med. K supp started 1 wk ago.  Discussed inc potassium in diet. Recheck BMET today.  An After Visit Summary was printed and given to the patient.  FOLLOW UP: No follow-ups on file.  Signed:  Crissie Sickles, MD           07/24/2022

## 2022-07-25 LAB — BASIC METABOLIC PANEL
BUN: 15 mg/dL (ref 7–25)
CO2: 28 mmol/L (ref 20–32)
Calcium: 9 mg/dL (ref 8.6–10.3)
Chloride: 103 mmol/L (ref 98–110)
Creat: 1.03 mg/dL (ref 0.70–1.35)
Glucose, Bld: 99 mg/dL (ref 65–99)
Potassium: 3.9 mmol/L (ref 3.5–5.3)
Sodium: 141 mmol/L (ref 135–146)

## 2022-07-31 DIAGNOSIS — Z681 Body mass index (BMI) 19 or less, adult: Secondary | ICD-10-CM | POA: Diagnosis not present

## 2022-07-31 DIAGNOSIS — R5382 Chronic fatigue, unspecified: Secondary | ICD-10-CM | POA: Diagnosis not present

## 2022-07-31 DIAGNOSIS — M0589 Other rheumatoid arthritis with rheumatoid factor of multiple sites: Secondary | ICD-10-CM | POA: Diagnosis not present

## 2022-07-31 DIAGNOSIS — Z79899 Other long term (current) drug therapy: Secondary | ICD-10-CM | POA: Diagnosis not present

## 2022-09-05 ENCOUNTER — Ambulatory Visit (INDEPENDENT_AMBULATORY_CARE_PROVIDER_SITE_OTHER): Payer: Medicare HMO

## 2022-09-05 VITALS — Wt 141.0 lb

## 2022-09-05 DIAGNOSIS — Z0000 Encounter for general adult medical examination without abnormal findings: Secondary | ICD-10-CM | POA: Diagnosis not present

## 2022-09-05 NOTE — Progress Notes (Signed)
I connected with  [name] on 09/05/22 by a audio enabled telemedicine application and verified that I am speaking with the correct person using two identifiers.  Patient Location: Home  Provider Location: Home Office  I discussed the limitations of evaluation and management by telemedicine. The patient expressed understanding and agreed to proceed.   Subjective:   George Marshall is a 72 y.o. male who presents for Medicare Annual/Subsequent preventive examination.  Review of Systems     Cardiac Risk Factors include: advanced age (>22men, >1 women);dyslipidemia;male gender     Objective:    Today's Vitals   09/05/22 1054  Weight: 141 lb (64 kg)   Body mass index is 19.67 kg/m.     09/05/2022   10:58 AM 08/30/2021   11:02 AM 08/24/2020    9:48 AM 01/07/2019   12:44 PM 07/28/2015    1:36 PM 08/23/2014   11:00 AM 09/08/2013    8:24 AM  Advanced Directives  Does Patient Have a Medical Advance Directive? Yes Yes Yes Yes No No   Type of Paramedic of [place];Living will [place];Living will [place];Living will      Does patient want to make changes to medical advance directive?    No - Patient declined     Copy of [place] in Chart? No - copy requested No - copy requested No - copy requested      Would patient like information on creating a medical advance directive?      No - patient declined information   Pre-existing out of facility DNR order (yellow form or pink MOST form)       No    Current Medications (verified) Outpatient Encounter Medications as of 09/05/2022  Medication Sig   adalimumab (HUMIRA PEN) 40 MG/0.8ML injection Inject 0.8 mLs (40 mg total) into the skin every 14 (fourteen) days.   Ascorbic Acid (VITAMIN C) 500 MG CHEW 1 tablet   atorvastatin (LIPITOR) 20 MG tablet TAKE 1 TABLET BY MOUTH EVERYDAY AT BEDTIME   Folic Acid (FOLATE PO) Take 0.4 mg by mouth in the morning and  at bedtime.   hydrochlorothiazide (HYDRODIURIL) 25 MG tablet Take 1 tablet (25 mg total) by mouth daily.   potassium chloride SA (KLOR-CON M) 20 MEQ tablet Take 1 tablet (20 mEq total) by mouth 2 (two) times daily.   vitamin B-12 (CYANOCOBALAMIN) 100 MCG tablet Take 1 tablet (100 mcg total) by mouth daily.   No facility-administered encounter medications on file as of 09/05/2022.    Allergies (verified) Patient has no known allergies.   History: Past Medical History:  Diagnosis Date   B12 deficiency 11/08/2010   Choledocholithiasis with acute cholangitis s/p ERCP 09/08/2013 09/12/2013   Dr Stacie Glaze GI    Closed displaced fracture of shaft of fifth metacarpal bone of right hand 07/2014   Reduced/splinted by Sports Med MD.   COPD (chronic obstructive pulmonary disease) ([place])    Dr. Elsworth Soho (as of 07/2015 FEV1 stable at 52%)  F/u spirometry stable 06/2016 with Dr. Elsworth Soho.  Spiriva trial in the past--no help.  Pt declines inhaler meds, functions fine w/out COPD meds as of 06/2018.   Folate deficiency 07/2019   paresthesias->folic acid supplement started-->paresthesias resolved.   Fracture of fifth toe, right, closed 09/26/2012   Fracture of great toe, right, closed 09/2012   Nondisplaced.  Buddy taped, post-op shoe, ortho eval was done s/p initial dx in urgent care center.  GERD (gastroesophageal reflux disease)    Gilbert syndrome    History of syncope 12/2004   Hyperlipidemia    recommended statin 09/2020   Pneumonia 10/20/2010   Initial dx 10/17/10--CXR and CT scan. F/u CT 11/06/10 showed resolving RUL and RML pneumonia and resolving reactive mediastinal LAD.     Rheumatoid arthritis(714.0) 10/2010   RF+, CCP+; managed well with Humira as of 06/2018 (Dr. Amil Amen)   White coat syndrome without hypertension 2018   Reconfirmed 08/2019 with normal home bps   Past Surgical History:  Procedure Laterality Date   AORTIC ULTRASOUND  09/2020   Medicare screening-->NEG for aneurism.  (+prox  aortic ectasia).   CHOLECYSTECTOMY N/A 09/10/2013   Procedure: LAPAROSCOPIC CHOLECYSTECTOMY, OPEN CHOLECYSTECTOMY WITH INTRAOPERATIVE CHOLANGIOGRAM;  Surgeon: Merrie Roof, MD;  Location: [facility];  Service: General;  Laterality: N/A;   ERCP N/A 09/08/2013   Procedure: ENDOSCOPIC RETROGRADE CHOLANGIOPANCREATOGRAPHY (ERCP);  Surgeon: Arta Silence, MD;  Location: [facility] END[facility]COPY;  Service: Endoscopy;  Laterality: N/A;   INGUINAL HERNIA REPAIR     age 69   Family History  Problem Relation Age of Onset   Breast cancer Mother 35   Ovarian cancer Mother 19   Skin cancer Mother    Pneumonia Father    Breast cancer Maternal Aunt        dx in her 31s   Breast cancer Maternal Aunt 70   Breast cancer Maternal Aunt 61   Prostate cancer Cousin        double first cousin   Hypertension Other        parents late 52's   Breast cancer Paternal Aunt    Social History   Socioeconomic History   Marital status: Divorced    Spouse name: Not on file   Number of children: Not on file   Years of education: Not on file   Highest education level: Not on file  Occupational History   Occupation: retired  Tobacco Use   Smoking status: Former    Packs/day: 1.50    Years: 30.00    Additional pack years: 0.00    Total pack years: 45.00    Types: Cigarettes    Quit date: 06/18/1998    Years since quitting: 24.2   Smokeless tobacco: Current    Types: Chew  Vaping Use   Vaping Use: Never used  Substance and Sexual Activity   Alcohol use: No   Drug use: No   Sexual activity: Not on file  Other Topics Concern   Not on file  Social History Narrative   Divorced, 2 step children.   Has live-in girlfriend.   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   [place] native, orig from [place], [place].   Tob: 30 pack-yr hx.  Quit 2000.   No alcohol.  No drugs.   Volunteer with [employer].   Gardening/yard work.   Social Determinants of Health   Financial Resource Strain: Low Risk   (09/05/2022)   Overall Financial Resource Strain (CARDIA)    Difficulty of Paying Living Expenses: Not hard at all  Food Insecurity: No Food Insecurity (09/05/2022)   Hunger Vital Sign    Worried About Running Out of Food in the Last Year: Never true    Ran Out of Food in the Last Year: Never true  Transportation Needs: No Transportation Needs (09/05/2022)   PRAPARE - Hydrologist (Medical): No    Lack of Transportation (Non-Medical): No  Physical Activity: Sufficiently Active (09/05/2022)   Exercise Vital Sign    Days of Exercise per Week: 3 days    Minutes of Exercise per Session: 90 min  Stress: No Stress Concern Present (09/05/2022)   [place]    Feeling of Stress : Not at all  Social Connections: Socially Isolated (09/05/2022)   Social Connection and Isolation Panel [NHANES]    Frequency of Communication with Friends and Family: More than three times a week    Frequency of Social Gatherings with Friends and Family: More than three times a week    Attends Religious Services: Never    Marine scientist or Organizations: No    Attends Music therapist: Never    Marital Status: Divorced    Tobacco Counseling Ready to quit: Not Answered Counseling given: Not Answered   Clinical Intake:  Pre-visit preparation completed: Yes  Pain : No/denies pain     BMI - recorded: 19.67 Nutritional Status: BMI of 19-24  Normal Nutritional Risks: None Diabetes: No  How often do you need to have someone help you when you read instructions, pamphlets, or other written materials from your doctor or pharmacy?: 1 - Never  Diabetic?no  Interpreter Needed?: No  Information entered by :: Charlott Rakes, LPN   Activities of Daily Living    09/05/2022   10:58 AM  In your present state of health, do you have any difficulty performing the following activities:  Hearing? 0   Vision? 0  Difficulty concentrating or making decisions? 0  Walking or climbing stairs? 0  Dressing or bathing? 0  Doing errands, shopping? 0  Preparing Food and eating ? N  Using the Toilet? N  In the past six months, have you accidently leaked urine? N  Do you have problems with loss of bowel control? N  Managing your Medications? N  Managing your Finances? N  Housekeeping or managing your Housekeeping? N    Patient Care Team: Tammi Sou, MD as PCP - General (Family Medicine) Jovita Kussmaul, MD as Consulting Physician (General Surgery) Eston Esters, MD (Inactive) as Consulting Physician (Hematology and Oncology) Arta Silence, MD as Consulting Physician (Gastroenterology) Silverio Decamp, MD as Consulting Physician (Sports Medicine) Hennie Duos, MD as Consulting Physician (Rheumatology) Rigoberto Noel, MD as Consulting Physician (Pulmonary Disease)  Indicate any recent Medical Services you may have received from other than [facility] providers in the past year (date may be approximate).     Assessment:   This is a routine wellness examination for [place].  Hearing/Vision screen Hearing Screening - Comments:: Pt denies any hearing issues  Vision Screening - Comments:: Pt follows up with my eye dr for annual eye exams   Dietary issues and exercise activities discussed: Current Exercise Habits: Home exercise routine, Type of exercise: Other - see comments, Time (Minutes): > 60, Frequency (Times/Week): 3, Weekly Exercise (Minutes/Week): 0   Goals Addressed             This Visit's Progress    Patient Stated       Stay healthy       Depression Screen    09/05/2022   10:57 AM 06/26/2022   10:30 AM 08/30/2021   11:01 AM 03/23/2021    9:33 AM 08/24/2020    9:51 AM 05/27/2019   11:07 AM  PHQ 2/9 Scores  PHQ - 2 Score 0 0 0 0 0 0    Fall Risk  09/05/2022   11:00 AM 06/26/2022   10:30 AM 08/30/2021   11:03 AM 03/23/2021    9:32 AM 08/24/2020    9:50 AM   Fall Risk   Falls in the past year? 0 0 0 0 0  Number falls in past yr: 0 0 0 0 0  Injury with Fall? 0 0 0 0 0  Risk for fall due to : Impaired vision No Fall Risks Impaired vision    Follow up Falls prevention discussed  Falls prevention discussed Falls evaluation completed Falls prevention discussed    FALL RISK PREVENTION PERTAINING TO THE HOME:  Any stairs in or around the home? Yes  If so, are there any without handrails? No  Home free of loose throw rugs in walkways, pet beds, electrical cords, etc? Yes  Adequate lighting in your home to reduce risk of falls? Yes   ASSISTIVE DEVICES UTILIZED TO PREVENT FALLS:  Life alert? No  Use of a cane, walker or w/c? No  Grab bars in the bathroom? No  Shower chair or bench in shower? Yes Elevated toilet seat or a handicapped toilet? No   TIMED UP AND GO:  Was the test performed? No .   Cognitive Function:        09/05/2022   10:59 AM 08/30/2021   11:04 AM  6CIT Screen  What Year? 0 points 0 points  What month? 0 points 0 points  What time? 0 points 0 points  Count back from 20 0 points 0 points  Months in reverse 0 points 0 points  Repeat phrase 0 points 0 points  Total Score 0 points 0 points    Immunizations Immunization History  Administered Date(s) Administered   Fluad Quad(high Dose 65+) 05/27/2019, 03/22/2020, 03/23/2021, 06/26/2022   Influenza Split 04/11/2016   Influenza, High Dose Seasonal PF 05/29/2018   Influenza,inj,Quad PF,6+ Mos 04/02/2013, 05/16/2017   Influenza-Unspecified 04/14/2014, 04/07/2015   Moderna Sars-Covid-2 Vaccination 07/20/2019, 08/17/2019, 05/25/2020      Flu Vaccine status: Up to date  Pneumococcal vaccine status: Due, Education has been provided regarding the importance of this vaccine. Advised may receive this vaccine at local pharmacy or Health Dept. Aware to provide a copy of the vaccination record if obtained from local pharmacy or Health Dept. Verbalized acceptance and  understanding.  Covid-19 vaccine status: Completed vaccines  Qualifies for Shingles Vaccine? Yes   Zostavax completed No   Shingrix Completed?: No.    Education has been provided regarding the importance of this vaccine. Patient has been advised to call insurance company to determine out of pocket expense if they have not yet received this vaccine. Advised may also receive vaccine at local pharmacy or Health Dept. Verbalized acceptance and understanding.  Screening Tests Health Maintenance  Topic Date Due   Pneumonia Vaccine 88+ Years old (1 of 2 - PCV) Never done   COVID-19 Vaccine (4 - 2023-24 season) 02/16/2022   Zoster Vaccines- Shingrix (1 of 2) 09/25/2022 (Originally 03/31/1970)   COLONOSCOPY (Pts 45-33yrs Insurance coverage will need to be confirmed)  09/05/2023 (Originally 03/31/1998)   Medicare Annual Wellness (AWV)  09/05/2023   INFLUENZA VACCINE  Completed   Hepatitis C Screening  Completed   HPV VACCINES  Aged Out   DTaP/Tdap/Td  Discontinued    Health Maintenance  Health Maintenance Due  Topic Date Due   Pneumonia Vaccine 62+ Years old (1 of 2 - PCV) Never done   COVID-19 Vaccine (4 - 2023-24 season) 02/16/2022    Pt declined  colonoscopy at this time   Additional Screening:  Hepatitis C Screening:  Completed 10/28/10  Vision Screening: Recommended annual ophthalmology exams for early detection of glaucoma and other disorders of the eye. Is the patient up to date with their annual eye exam?  Yes  Who is the provider or what is the name of the office in which the patient attends annual eye exams? [facility]  If pt is not established with a provider, would they like to be referred to a provider to establish care? No .   Dental Screening: Recommended annual dental exams for proper oral hygiene  Community Resource Referral / Chronic Care Management: CRR required this visit?  No   CCM required this visit?  No      Plan:     I have personally reviewed and  noted the following in the patient's chart:   Medical and social history Use of alcohol, tobacco or illicit drugs  Current medications and supplements including opioid prescriptions. Patient is not currently taking opioid prescriptions. Functional ability and status Nutritional status Physical activity Advanced directives List of other physicians Hospitalizations, surgeries, and ER visits in previous 12 months Vitals Screenings to include cognitive, depression, and falls Referrals and appointments  In addition, I have reviewed and discussed with patient certain preventive protocols, quality metrics, and best practice recommendations. A written personalized care plan for preventive services as well as general preventive health recommendations were provided to patient.     Willette Brace, LPN   [id number]   Nurse Notes: none

## 2022-09-05 NOTE — Patient Instructions (Signed)
George Marshall , Thank you for taking time to come for your Medicare Wellness Visit. I appreciate your ongoing commitment to your health goals. Please review the following plan we discussed and let me know if I can assist you in the future.   These are the goals we discussed:  Goals      none at this time     Patient Stated     Maintain healthy active lifestyle     Patient Stated     Stay healthy        This is a list of the screening recommended for you and due dates:  Health Maintenance  Topic Date Due   Pneumonia Vaccine (1 of 2 - PCV) Never done   COVID-19 Vaccine (4 - 2023-24 season) 02/16/2022   Zoster (Shingles) Vaccine (1 of 2) 09/25/2022*   Colon Cancer Screening  09/05/2023*   Medicare Annual Wellness Visit  09/05/2023   Flu Shot  Completed   Hepatitis C Screening: USPSTF Recommendation to screen - Ages 18-79 yo.  Completed   HPV Vaccine  Aged Out   DTaP/Tdap/Td vaccine  Discontinued  *Topic was postponed. The date shown is not the original due date.    Advanced directives: Please bring a copy of your health care power of attorney and living will to the office at your convenience.  Conditions/risks identified: stay healthy  Next appointment: Follow up in one year for your annual wellness visit.   Preventive Care 54 Years and Older, Male  Preventive care refers to lifestyle choices and visits with your health care provider that can promote health and wellness. What does preventive care include? A yearly physical exam. This is also called an annual well check. Dental exams once or twice a year. Routine eye exams. Ask your health care provider how often you should have your eyes checked. Personal lifestyle choices, including: Daily care of your teeth and gums. Regular physical activity. Eating a healthy diet. Avoiding tobacco and drug use. Limiting alcohol use. Practicing safe sex. Taking low doses of aspirin every day. Taking vitamin and mineral supplements as  recommended by your health care provider. What happens during an annual well check? The services and screenings done by your health care provider during your annual well check will depend on your age, overall health, lifestyle risk factors, and family history of disease. Counseling  Your health care provider may ask you questions about your: Alcohol use. Tobacco use. Drug use. Emotional well-being. Home and relationship well-being. Sexual activity. Eating habits. History of falls. Memory and ability to understand (cognition). Work and work Statistician. Screening  You may have the following tests or measurements: Height, weight, and BMI. Blood pressure. Lipid and cholesterol levels. These may be checked every 5 years, or more frequently if you are over 61 years old. Skin check. Lung cancer screening. You may have this screening every year starting at age 102 if you have a 30-pack-year history of smoking and currently smoke or have quit within the past 15 years. Fecal occult blood test (FOBT) of the stool. You may have this test every year starting at age 31. Flexible sigmoidoscopy or colonoscopy. You may have a sigmoidoscopy every 5 years or a colonoscopy every 10 years starting at age 69. Prostate cancer screening. Recommendations will vary depending on your family history and other risks. Hepatitis C blood test. Hepatitis B blood test. Sexually transmitted disease (STD) testing. Diabetes screening. This is done by checking your blood sugar (glucose) after you have not  eaten for a while (fasting). You may have this done every 1-3 years. Abdominal aortic aneurysm (AAA) screening. You may need this if you are a current or former smoker. Osteoporosis. You may be screened starting at age 9 if you are at high risk. Talk with your health care provider about your test results, treatment options, and if necessary, the need for more tests. Vaccines  Your health care provider may recommend  certain vaccines, such as: Influenza vaccine. This is recommended every year. Tetanus, diphtheria, and acellular pertussis (Tdap, Td) vaccine. You may need a Td booster every 10 years. Zoster vaccine. You may need this after age 40. Pneumococcal 13-valent conjugate (PCV13) vaccine. One dose is recommended after age 39. Pneumococcal polysaccharide (PPSV23) vaccine. One dose is recommended after age 55. Talk to your health care provider about which screenings and vaccines you need and how often you need them. This information is not intended to replace advice given to you by your health care provider. Make sure you discuss any questions you have with your health care provider. Document Released: 07/01/2015 Document Revised: 02/22/2016 Document Reviewed: 04/05/2015 Elsevier Interactive Patient Education  2017 [place] Prevention in the Home Falls can cause injuries. They can happen to people of all ages. There are many things you can do to make your home safe and to help prevent falls. What can I do on the outside of my home? Regularly fix the edges of walkways and driveways and fix any cracks. Remove anything that might make you trip as you walk through a door, such as a raised step or threshold. Trim any bushes or trees on the path to your home. Use bright outdoor lighting. Clear any walking paths of anything that might make someone trip, such as rocks or tools. Regularly check to see if handrails are loose or broken. Make sure that both sides of any steps have handrails. Any raised decks and porches should have guardrails on the edges. Have any leaves, snow, or ice cleared regularly. Use sand or salt on walking paths during winter. Clean up any spills in your garage right away. This includes oil or grease spills. What can I do in the bathroom? Use night lights. Install grab bars by the toilet and in the tub and shower. Do not use towel bars as grab bars. Use non-skid mats or  decals in the tub or shower. If you need to sit down in the shower, use a plastic, non-slip stool. Keep the floor dry. Clean up any water that spills on the floor as soon as it happens. Remove soap buildup in the tub or shower regularly. Attach bath mats securely with double-sided non-slip rug tape. Do not have throw rugs and other things on the floor that can make you trip. What can I do in the bedroom? Use night lights. Make sure that you have a light by your bed that is easy to reach. Do not use any sheets or blankets that are too big for your bed. They should not hang down onto the floor. Have a firm chair that has side arms. You can use this for support while you get dressed. Do not have throw rugs and other things on the floor that can make you trip. What can I do in the kitchen? Clean up any spills right away. Avoid walking on wet floors. Keep items that you use a lot in easy-to-reach places. If you need to reach something above you, use a strong step stool that  has a grab bar. Keep electrical cords out of the way. Do not use floor polish or wax that makes floors slippery. If you must use wax, use non-skid floor wax. Do not have throw rugs and other things on the floor that can make you trip. What can I do with my stairs? Do not leave any items on the stairs. Make sure that there are handrails on both sides of the stairs and use them. Fix handrails that are broken or loose. Make sure that handrails are as long as the stairways. Check any carpeting to make sure that it is firmly attached to the stairs. Fix any carpet that is loose or worn. Avoid having throw rugs at the top or bottom of the stairs. If you do have throw rugs, attach them to the floor with carpet tape. Make sure that you have a light switch at the top of the stairs and the bottom of the stairs. If you do not have them, ask someone to add them for you. What else can I do to help prevent falls? Wear shoes that: Do not  have high heels. Have rubber bottoms. Are comfortable and fit you well. Are closed at the toe. Do not wear sandals. If you use a stepladder: Make sure that it is fully opened. Do not climb a closed stepladder. Make sure that both sides of the stepladder are locked into place. Ask someone to hold it for you, if possible. Clearly mark and make sure that you can see: Any grab bars or handrails. First and last steps. Where the edge of each step is. Use tools that help you move around (mobility aids) if they are needed. These include: Canes. Walkers. Scooters. Crutches. Turn on the lights when you go into a dark area. Replace any light bulbs as soon as they burn out. Set up your furniture so you have a clear path. Avoid moving your furniture around. If any of your floors are uneven, fix them. If there are any pets around you, be aware of where they are. Review your medicines with your doctor. Some medicines can make you feel dizzy. This can increase your chance of falling. Ask your doctor what other things that you can do to help prevent falls. This information is not intended to replace advice given to you by your health care provider. Make sure you discuss any questions you have with your health care provider. Document Released: 03/31/2009 Document Revised: 11/10/2015 Document Reviewed: 07/09/2014 Elsevier Interactive Patient Education  2017 [employer].

## 2022-10-19 DIAGNOSIS — R1030 Lower abdominal pain, unspecified: Secondary | ICD-10-CM | POA: Diagnosis not present

## 2022-10-19 DIAGNOSIS — Z635 Disruption of family by separation and divorce: Secondary | ICD-10-CM | POA: Diagnosis not present

## 2022-10-19 DIAGNOSIS — R1110 Vomiting, unspecified: Secondary | ICD-10-CM | POA: Diagnosis not present

## 2022-10-19 DIAGNOSIS — R7989 Other specified abnormal findings of blood chemistry: Secondary | ICD-10-CM | POA: Diagnosis not present

## 2022-10-19 DIAGNOSIS — E876 Hypokalemia: Secondary | ICD-10-CM | POA: Diagnosis not present

## 2022-10-19 DIAGNOSIS — I10 Essential (primary) hypertension: Secondary | ICD-10-CM | POA: Diagnosis not present

## 2022-10-19 DIAGNOSIS — Z9049 Acquired absence of other specified parts of digestive tract: Secondary | ICD-10-CM | POA: Diagnosis not present

## 2022-10-19 DIAGNOSIS — R1013 Epigastric pain: Secondary | ICD-10-CM | POA: Diagnosis not present

## 2022-10-19 DIAGNOSIS — R531 Weakness: Secondary | ICD-10-CM | POA: Diagnosis not present

## 2022-10-19 DIAGNOSIS — R945 Abnormal results of liver function studies: Secondary | ICD-10-CM | POA: Diagnosis not present

## 2022-10-30 NOTE — Progress Notes (Signed)
[facility] Quality Team Note  Name: George Marshall Date of Birth: 1951/05/11 MRN: [id number] Date: 10/30/2022  [facility] Quality Team has reviewed this patient's chart, please see recommendations below:  [facility] Quality Other; ([place]- Patient has an upcoming visit with [facility] 01/22/2023. Please offer FOBT / Cologuard Kit or Colonoscopy scheduling.)

## 2023-01-22 ENCOUNTER — Encounter: Payer: Self-pay | Admitting: Family Medicine

## 2023-01-22 ENCOUNTER — Ambulatory Visit (INDEPENDENT_AMBULATORY_CARE_PROVIDER_SITE_OTHER): Payer: Medicare HMO | Admitting: Family Medicine

## 2023-01-22 VITALS — BP 136/81 | HR 95 | Wt 140.2 lb

## 2023-01-22 DIAGNOSIS — Z23 Encounter for immunization: Secondary | ICD-10-CM

## 2023-01-22 DIAGNOSIS — I10 Essential (primary) hypertension: Secondary | ICD-10-CM | POA: Diagnosis not present

## 2023-01-22 DIAGNOSIS — E7800 Pure hypercholesterolemia, unspecified: Secondary | ICD-10-CM | POA: Diagnosis not present

## 2023-01-22 DIAGNOSIS — R3129 Other microscopic hematuria: Secondary | ICD-10-CM

## 2023-01-22 DIAGNOSIS — Z8639 Personal history of other endocrine, nutritional and metabolic disease: Secondary | ICD-10-CM | POA: Diagnosis not present

## 2023-01-22 LAB — POCT URINALYSIS DIPSTICK
Bilirubin, UA: NEGATIVE
Blood, UA: NEGATIVE
Glucose, UA: NEGATIVE
Ketones, UA: NEGATIVE
Leukocytes, UA: NEGATIVE
Nitrite, UA: NEGATIVE
Protein, UA: POSITIVE — AB
Spec Grav, UA: 1.025 (ref 1.010–1.025)
Urobilinogen, UA: 0.2 E.U./dL
pH, UA: 5.5 (ref 5.0–8.0)

## 2023-01-22 MED ORDER — HYDROCHLOROTHIAZIDE 25 MG PO TABS: 25.0000 mg | ORAL_TABLET | Freq: Every day | ORAL | 1 refills | Status: DC

## 2023-01-22 MED ORDER — POTASSIUM CHLORIDE CRYS ER 20 MEQ PO TBCR: 20.0000 meq | EXTENDED_RELEASE_TABLET | Freq: Two times a day (BID) | ORAL | 6 refills | Status: DC

## 2023-01-22 NOTE — Progress Notes (Signed)
OFFICE VISIT  01/22/2023  CC:  Chief Complaint  Patient presents with   Medical Management of Chronic Issues    Patient is a 72 y.o. male who presents for 64-month follow-up hypertension and hyperlipidemia. A/P as of last visit: "HTN, well controlled now on hctz 25mg  qd. Mild hypokalemia noted after starting this med. K supp started 1 wk ago.  Discussed inc potassium in diet. Recheck BMET today."  INTERIM HX: George Marshall is feeling well.  He went to an emergency department in [place] for abdominal pain on 10/19/2022.  I was able to review the encounter data for this today. CT abdomen pelvis with contrast showed mild/borderline splenomegaly. Lab work showed potassium 3.2, AST 62, ALT 79, total bilirubin 3.6, glucose 109.  Hematocrit 39.1 platelets 118,000.  Lipase normal, acute hepatitis panel normal.  Urinalysis showed some microhematuria but no WBCs. Urine was sent for culture.  No medications were given in the emergency department and no new medications prescribed.  He says his pain resolved that day in the ER and he has not had problems since.  He denies any gross hematuria. He has all very remote history of passing a kidney stone per his report today--approximately 2008.  He has not had any problems since except occasional pain in flank, says it could be left or right.  No radiation of the pain down into the lower abdomen or groin.  No gross hematuria. No dysuria or change in urinary urgency or frequency.  ROS as above, plus--> no fevers, no CP, no SOB, no wheezing, no cough, no dizziness, no HAs, no rashes, no melena/hematochezia.  No polyuria or polydipsia.  No myalgias or arthralgias.  No focal weakness, paresthesias, or tremors.  No acute vision or hearing abnormalities.    No recent changes in lower legs. No n/v/d or abd pain.  No palpitations.     Past Medical History:  Diagnosis Date   B12 deficiency 11/08/2010   Choledocholithiasis with acute cholangitis s/p ERCP 09/08/2013  09/12/2013   Dr Hinda Lenis GI    Closed displaced fracture of shaft of fifth metacarpal bone of right hand 07/2014   Reduced/splinted by Sports Med MD.   COPD (chronic obstructive pulmonary disease) (HCC)    Dr. Vassie Loll (as of 07/2015 FEV1 stable at 52%)  F/u spirometry stable 06/2016 with Dr. Vassie Loll.  Spiriva trial in the past--no help.  Pt declines inhaler meds, functions fine w/out COPD meds as of 06/2018.   Folate deficiency 07/2019   paresthesias->folic acid supplement started-->paresthesias resolved.   Fracture of fifth toe, right, closed 09/26/2012   Fracture of great toe, right, closed 09/2012   Nondisplaced.  Buddy taped, post-op shoe, ortho eval was done s/p initial dx in urgent care center.   GERD (gastroesophageal reflux disease)    Gilbert syndrome    History of syncope 12/2004   Hyperlipidemia    recommended statin 09/2020   Pneumonia 10/20/2010   Initial dx 10/17/10--CXR and CT scan. F/u CT 11/06/10 showed resolving RUL and RML pneumonia and resolving reactive mediastinal LAD.     Rheumatoid arthritis(714.0) 10/2010   RF+, CCP+; managed well with Humira as of 06/2018 (Dr. Dierdre Forth)   White coat syndrome without hypertension 2018   Reconfirmed 08/2019 with normal home bps    Past Surgical History:  Procedure Laterality Date   AORTIC ULTRASOUND  09/2020   Medicare screening-->NEG for aneurism.  (+prox aortic ectasia).   CHOLECYSTECTOMY N/A 09/10/2013   Procedure: LAPAROSCOPIC CHOLECYSTECTOMY, OPEN CHOLECYSTECTOMY WITH INTRAOPERATIVE CHOLANGIOGRAM;  Surgeon:  Caleen Essex III, MD;  Location: [facility];  Service: General;  Laterality: N/A;   ERCP N/A 09/08/2013   Procedure: ENDOSCOPIC RETROGRADE CHOLANGIOPANCREATOGRAPHY (ERCP);  Surgeon: Willis Modena, MD;  Location: [facility] ENDOSCOPY;  Service: Endoscopy;  Laterality: N/A;   INGUINAL HERNIA REPAIR     age 84    Outpatient Medications Prior to Visit  Medication Sig Dispense Refill   adalimumab (HUMIRA PEN) 40 MG/0.8ML injection Inject 0.8  mLs (40 mg total) into the skin every 14 (fourteen) days.  0   Ascorbic Acid (VITAMIN C) 500 MG CHEW 1 tablet     atorvastatin (LIPITOR) 20 MG tablet TAKE 1 TABLET BY MOUTH EVERYDAY AT BEDTIME 90 tablet 3   Folic Acid (FOLATE PO) Take 0.4 mg by mouth in the morning and at bedtime.     vitamin B-12 (CYANOCOBALAMIN) 100 MCG tablet Take 1 tablet (100 mcg total) by mouth daily. 30 tablet 0   hydrochlorothiazide (HYDRODIURIL) 25 MG tablet Take 1 tablet (25 mg total) by mouth daily. 90 tablet 1   potassium chloride SA (KLOR-CON M) 20 MEQ tablet Take 1 tablet (20 mEq total) by mouth 2 (two) times daily. 60 tablet 6   No facility-administered medications prior to visit.    No Known Allergies  Review of Systems As per HPI  PE:    01/22/2023    8:47 AM 09/05/2022   10:54 AM 07/24/2022    8:35 AM  Vitals with BMI  Height   5\' 11"   Weight 140 lbs 3 oz 141 lbs 141 lbs  BMI   19.67  Systolic 136  135  Diastolic 81  76  Pulse 95  90     Physical Exam  Gen: Alert, well appearing.  Patient is oriented to person, place, time, and situation. No further exam today  LABS:  Last CBC Lab Results  Component Value Date   WBC 5.0 09/20/2020   HGB 16.0 09/20/2020   HCT 46.0 09/20/2020   MCV 87.8 09/20/2020   MCH 30.5 09/20/2020   RDW 13.0 09/20/2020   PLT 171 09/20/2020   Last metabolic panel Lab Results  Component Value Date   GLUCOSE 99 07/24/2022   NA 141 07/24/2022   K 3.9 07/24/2022   CL 103 07/24/2022   CO2 28 07/24/2022   BUN 15 07/24/2022   CREATININE 1.03 07/24/2022   GFR 72.55 07/10/2022   CALCIUM 9.0 07/24/2022   PROT 6.7 06/26/2022   ALBUMIN 4.1 03/23/2021   BILITOT 2.0 (H) 06/26/2022   ALKPHOS 79 03/23/2021   AST 14 06/26/2022   ALT 16 06/26/2022   ANIONGAP 10 01/07/2019   Last lipids Lab Results  Component Value Date   CHOL 130 06/26/2022   HDL 48 06/26/2022   LDLCALC 65 06/26/2022   LDLDIRECT 191.5 05/17/2006   TRIG 86 06/26/2022   CHOLHDL 2.7 06/26/2022    Last hemoglobin A1c Lab Results  Component Value Date   HGBA1C 4.2 08/21/2019   Last thyroid functions Lab Results  Component Value Date   TSH 1.89 08/21/2019   Last vitamin B12 and Folate Lab Results  Component Value Date   VITAMINB12 683 06/26/2022   FOLATE 21.7 06/26/2022   Lab Results  Component Value Date   FOLATE 21.7 06/26/2022   IMPRESSION AND PLAN:  #1 hypertension, well-controlled on HCTZ 25 mg a day. History of hypokalemia, currently on 20 mill equivalents of potassium twice a day. Checking electrolytes and creatinine today.  2.  Hypercholesterolemia, doing well on Lipitor  20 mg a day. Lipid panel today.  3.  Nonspecific abdominal pain. Extensive emergency department workup in [place] about 3 months ago.  His pain resolved the same day.  No recurrence since. Obs.  #4 microscopic hematuria, incidentally detected on his abdominal pain workup in the emergency department in [place] on 10/19/2022. CT abdomen pelvis with IV contrast at that time showed a couple of small renal cysts bilaterally but otherwise normal-->rpt UA today normal. Specimen sent for microscopy.  He has a remote history of kidney stone but has not been symptomatic with this in over a decade.  He has never seen a urologist. Ordered renal ultrasound today. If no stone then will have urology see him. If he has no RBCs on microscopy of his urine today AND if he has a stone present on ultrasound then we will hold off on urologic referral.  An After Visit Summary was printed and given to the patient.  FOLLOW UP: Return in about 6 months (around 07/25/2023) for annual CPE (fasting). Next CPE 06/2023 Signed:  Santiago Bumpers, MD           01/22/2023

## 2023-01-23 ENCOUNTER — Encounter: Payer: Self-pay | Admitting: Family Medicine

## 2023-01-23 NOTE — Progress Notes (Signed)
Pt given results/recommendations.

## 2023-01-29 DIAGNOSIS — M0589 Other rheumatoid arthritis with rheumatoid factor of multiple sites: Secondary | ICD-10-CM | POA: Diagnosis not present

## 2023-01-29 DIAGNOSIS — Z79899 Other long term (current) drug therapy: Secondary | ICD-10-CM | POA: Diagnosis not present

## 2023-01-29 DIAGNOSIS — R5382 Chronic fatigue, unspecified: Secondary | ICD-10-CM | POA: Diagnosis not present

## 2023-01-29 DIAGNOSIS — Z681 Body mass index (BMI) 19 or less, adult: Secondary | ICD-10-CM | POA: Diagnosis not present

## 2023-01-31 ENCOUNTER — Other Ambulatory Visit: Payer: Self-pay | Admitting: Family Medicine

## 2023-01-31 ENCOUNTER — Ambulatory Visit (HOSPITAL_BASED_OUTPATIENT_CLINIC_OR_DEPARTMENT_OTHER)
Admission: RE | Admit: 2023-01-31 | Discharge: 2023-01-31 | Disposition: A | Discharge status: Home / Self Care | Payer: Medicare HMO | Source: Ambulatory Visit | Attending: Family Medicine | Admitting: Family Medicine

## 2023-01-31 ENCOUNTER — Encounter: Payer: Self-pay | Admitting: Family Medicine

## 2023-01-31 DIAGNOSIS — R3129 Other microscopic hematuria: Secondary | ICD-10-CM | POA: Diagnosis not present

## 2023-01-31 DIAGNOSIS — N281 Cyst of kidney, acquired: Secondary | ICD-10-CM | POA: Diagnosis not present

## 2023-02-01 NOTE — Progress Notes (Signed)
Pt given results & scheduled for repeat urine in 3 mo.

## 2023-04-02 ENCOUNTER — Other Ambulatory Visit (INDEPENDENT_AMBULATORY_CARE_PROVIDER_SITE_OTHER): Payer: Medicare HMO

## 2023-04-02 DIAGNOSIS — R3129 Other microscopic hematuria: Secondary | ICD-10-CM

## 2023-04-02 LAB — URINALYSIS, ROUTINE W REFLEX MICROSCOPIC
Bilirubin Urine: NEGATIVE
Hgb urine dipstick: NEGATIVE
Ketones, ur: NEGATIVE
Leukocytes,Ua: NEGATIVE
Nitrite: NEGATIVE
RBC / HPF: NONE SEEN (ref 0–?)
Specific Gravity, Urine: 1.01 (ref 1.000–1.030)
Total Protein, Urine: NEGATIVE
Urine Glucose: NEGATIVE
Urobilinogen, UA: 0.2 (ref 0.0–1.0)
WBC, UA: NONE SEEN (ref 0–?)
pH: 7 (ref 5.0–8.0)

## 2023-04-02 NOTE — Progress Notes (Signed)
Pt presented for repeat urine testing, specimen collected with no issues.

## 2023-05-08 ENCOUNTER — Other Ambulatory Visit: Payer: Self-pay | Admitting: Family Medicine

## 2023-06-07 ENCOUNTER — Other Ambulatory Visit: Payer: Self-pay | Admitting: Family Medicine

## 2023-07-18 ENCOUNTER — Other Ambulatory Visit: Payer: Self-pay | Admitting: Family Medicine

## 2023-08-06 DIAGNOSIS — R5382 Chronic fatigue, unspecified: Secondary | ICD-10-CM | POA: Diagnosis not present

## 2023-08-06 DIAGNOSIS — Z79899 Other long term (current) drug therapy: Secondary | ICD-10-CM | POA: Diagnosis not present

## 2023-08-06 DIAGNOSIS — Z6820 Body mass index (BMI) 20.0-20.9, adult: Secondary | ICD-10-CM | POA: Diagnosis not present

## 2023-08-06 DIAGNOSIS — M0589 Other rheumatoid arthritis with rheumatoid factor of multiple sites: Secondary | ICD-10-CM | POA: Diagnosis not present

## 2023-08-10 ENCOUNTER — Other Ambulatory Visit: Payer: Self-pay | Admitting: Family Medicine

## 2023-08-22 ENCOUNTER — Other Ambulatory Visit: Payer: Self-pay

## 2023-08-22 ENCOUNTER — Other Ambulatory Visit: Payer: Self-pay | Admitting: Family Medicine

## 2023-08-22 MED ORDER — HYDROCHLOROTHIAZIDE 25 MG PO TABS: 25.0000 mg | ORAL_TABLET | Freq: Every day | ORAL | 0 refills | Status: DC

## 2023-08-26 NOTE — Patient Instructions (Signed)

## 2023-08-27 ENCOUNTER — Encounter: Payer: Self-pay | Admitting: Family Medicine

## 2023-08-27 ENCOUNTER — Ambulatory Visit: Admitting: Family Medicine

## 2023-08-27 VITALS — BP 146/80 | HR 99 | Ht 71.0 in | Wt 147.2 lb

## 2023-08-27 DIAGNOSIS — E7800 Pure hypercholesterolemia, unspecified: Secondary | ICD-10-CM

## 2023-08-27 DIAGNOSIS — E538 Deficiency of other specified B group vitamins: Secondary | ICD-10-CM

## 2023-08-27 DIAGNOSIS — Z0000 Encounter for general adult medical examination without abnormal findings: Secondary | ICD-10-CM | POA: Diagnosis not present

## 2023-08-27 DIAGNOSIS — I10 Essential (primary) hypertension: Secondary | ICD-10-CM

## 2023-08-27 DIAGNOSIS — Z125 Encounter for screening for malignant neoplasm of prostate: Secondary | ICD-10-CM

## 2023-08-27 LAB — PSA, MEDICARE: PSA: 1.05 ng/mL (ref 0.10–4.00)

## 2023-08-27 MED ORDER — POTASSIUM CHLORIDE CRYS ER 20 MEQ PO TBCR
20.0000 meq | EXTENDED_RELEASE_TABLET | Freq: Two times a day (BID) | ORAL | 3 refills | Status: DC
Start: 2023-08-27 — End: 2024-03-03

## 2023-08-27 MED ORDER — HYDROCHLOROTHIAZIDE 25 MG PO TABS: 25.0000 mg | ORAL_TABLET | Freq: Every day | ORAL | 3 refills | Status: AC

## 2023-08-27 MED ORDER — ATORVASTATIN CALCIUM 20 MG PO TABS: ORAL_TABLET | ORAL | 3 refills | Status: AC

## 2023-08-27 NOTE — Progress Notes (Signed)
 Office Note 08/27/2023  CC:  Chief Complaint  Patient presents with   Medical Management of Chronic Issues   Patient is a 73 y.o. male who is here for annual health maintenance exam and f/u HLD and HTN. A/P as of last visit: "1 hypertension, well-controlled on HCTZ 25 mg a day. History of hypokalemia, currently on 20 mill equivalents of potassium twice a day. Checking electrolytes and creatinine today.   2.  Hypercholesterolemia, doing well on Lipitor 20 mg a day. Lipid panel today.   3.  Nonspecific abdominal pain. Extensive emergency department workup in [place] about 3 months ago.  His pain resolved the same day.  No recurrence since. Obs.   #4 microscopic hematuria, incidentally detected on his abdominal pain workup in the emergency department in [place] on 10/19/2022. CT abdomen pelvis with IV contrast at that time showed a couple of small renal cysts bilaterally but otherwise normal-->rpt UA today normal. Specimen sent for microscopy.   He has a remote history of kidney stone but has not been symptomatic with this in over a decade.  He has never seen a urologist. Ordered renal ultrasound today. If no stone then will have urology see him. If he has no RBCs on microscopy of his urine today AND if he has a stone present on ultrasound then we will hold off on urologic referral."  INTERIM HX: He feels very well. He remains active on his farm. He does not check his blood pressure at home but he does have a blood pressure cuff.  UA last visit did not show any blood. Renal u/s unremarkable other than mild medical renal dz.  Still taking adalimumab per rheum for RA.  He does not have any joint pain at all.  Past Medical History:  Diagnosis Date   B12 deficiency 11/08/2010   Choledocholithiasis with acute cholangitis s/p ERCP 09/08/2013 09/12/2013   Dr Hinda Lenis GI    Closed displaced fracture of shaft of fifth metacarpal bone of right hand 07/2014   Reduced/splinted by  Sports Med MD.   COPD (chronic obstructive pulmonary disease) (HCC)    Dr. Vassie Loll (as of 07/2015 FEV1 stable at 52%)  F/u spirometry stable 06/2016 with Dr. Vassie Loll.  Spiriva trial in the past--no help.  Pt declines inhaler meds, functions fine w/out COPD meds as of 06/2018.   Folate deficiency 07/2019   paresthesias->folic acid supplement started-->paresthesias resolved.   Fracture of fifth toe, right, closed 09/26/2012   Fracture of great toe, right, closed 09/2012   Nondisplaced.  Buddy taped, post-op shoe, ortho eval was done s/p initial dx in urgent care center.   GERD (gastroesophageal reflux disease)    [name] Lone syndrome    History of syncope 12/2004   Hyperlipidemia    recommended statin 09/2020   Microhematuria    2024 (x1 on out of state UA in [place].  Rpt here no blood.  renal u/s nl 01/2023.  Plan rpt UA w/micro approx 04/2023.   Pneumonia 10/20/2010   Initial dx 10/17/10--CXR and CT scan. F/u CT 11/06/10 showed resolving RUL and RML pneumonia and resolving reactive mediastinal LAD.     Rheumatoid arthritis(714.0) 10/2010   RF+, CCP+; managed well with Humira as of 06/2018 (Dr. Dierdre Forth)   White coat syndrome without hypertension 2018   Reconfirmed 08/2019 with normal home bps    Past Surgical History:  Procedure Laterality Date   AORTIC ULTRASOUND  09/2020   Medicare screening-->NEG for aneurism.  (+prox aortic ectasia).   CHOLECYSTECTOMY N/A  09/10/2013   Procedure: LAPAROSCOPIC CHOLECYSTECTOMY, OPEN CHOLECYSTECTOMY WITH INTRAOPERATIVE CHOLANGIOGRAM;  Surgeon: Robyne Askew, MD;  Location: [facility];  Service: General;  Laterality: N/A;   ERCP N/A 09/08/2013   Procedure: ENDOSCOPIC RETROGRADE CHOLANGIOPANCREATOGRAPHY (ERCP);  Surgeon: Willis Modena, MD;  Location: [facility] ENDOSCOPY;  Service: Endoscopy;  Laterality: N/A;   INGUINAL HERNIA REPAIR     age 84    Family History  Problem Relation Age of Onset   Breast cancer Mother 49   Ovarian cancer Mother 34   Skin cancer Mother     Pneumonia Father    Breast cancer Maternal Aunt        dx in her 109s   Breast cancer Maternal Aunt 44   Breast cancer Maternal Aunt 6   Prostate cancer Cousin        double first cousin   Hypertension Other        parents late 15's   Breast cancer Paternal Aunt     Social History   Socioeconomic History   Marital status: Divorced    Spouse name: Not on file   Number of children: Not on file   Years of education: Not on file   Highest education level: Not on file  Occupational History   Occupation: retired  Tobacco Use   Smoking status: Former    Current packs/day: 0.00    Average packs/day: 1.5 packs/day for 30.0 years (45.0 ttl pk-yrs)    Types: Cigarettes    Start date: 06/18/1968    Quit date: 06/18/1998    Years since quitting: 25.2   Smokeless tobacco: Current    Types: Chew  Vaping Use   Vaping status: Never Used  Substance and Sexual Activity   Alcohol use: No   Drug use: No   Sexual activity: Not on file  Other Topics Concern   Not on file  Social History Narrative   Divorced, 2 step children.   Has live-in girlfriend.   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   [place] native, orig from [place], [place].   Tob: 30 pack-yr hx.  Quit 2000.   No alcohol.  No drugs.   Volunteer with [employer].   Gardening/yard work.   Social Drivers of Corporate investment banker Strain: Low Risk  (09/05/2022)   Overall Financial Resource Strain (CARDIA)    Difficulty of Paying Living Expenses: Not hard at all  Food Insecurity: No Food Insecurity (09/05/2022)   Hunger Vital Sign    Worried About Running Out of Food in the Last Year: Never true    Ran Out of Food in the Last Year: Never true  Transportation Needs: No Transportation Needs (09/05/2022)   PRAPARE - Administrator, Civil Service (Medical): No    Lack of Transportation (Non-Medical): No  Physical Activity: Sufficiently Active (09/05/2022)   Exercise Vital Sign    Days of  Exercise per Week: 3 days    Minutes of Exercise per Session: 90 min  Stress: No Stress Concern Present (09/05/2022)   [employer] of Occupational Health - Occupational Stress Questionnaire    Feeling of Stress : Not at all  Social Connections: Socially Isolated (09/05/2022)   Social Connection and Isolation Panel [NHANES]    Frequency of Communication with Friends and Family: More than three times a week    Frequency of Social Gatherings with Friends and Family: More than three times a week    Attends Religious Services: Never  Active Member of Clubs or Organizations: No    Attends Banker Meetings: Never    Marital Status: Divorced  Catering manager Violence: Not At Risk (09/05/2022)   Humiliation, Afraid, Rape, and Kick questionnaire    Fear of Current or Ex-Partner: No    Emotionally Abused: No    Physically Abused: No    Sexually Abused: No    Outpatient Medications Prior to Visit  Medication Sig Dispense Refill   Ascorbic Acid (VITAMIN C) 500 MG CHEW 1 tablet     Folic Acid (FOLATE PO) Take 0.4 mg by mouth in the morning and at bedtime.     HYRIMOZ 40 MG/0.4ML SOAJ Inject 0.8 mLs into the skin every 14 (fourteen) days.     vitamin B-12 (CYANOCOBALAMIN) 100 MCG tablet Take 1 tablet (100 mcg total) by mouth daily. 30 tablet 0   adalimumab (HUMIRA PEN) 40 MG/0.8ML injection Inject 0.8 mLs (40 mg total) into the skin every 14 (fourteen) days. (Patient not taking: Reported on 08/27/2023)  0   atorvastatin (LIPITOR) 20 MG tablet TAKE 1 TABLET BY MOUTH EVERYDAY AT BEDTIME. OFFICE VISIT NEEDED FOR FURTHER REFILLS 14 tablet 0   hydrochlorothiazide (HYDRODIURIL) 25 MG tablet Take 1 tablet (25 mg total) by mouth daily. 30 tablet 0   potassium chloride SA (KLOR-CON M) 20 MEQ tablet Take 1 tablet (20 mEq total) by mouth 2 (two) times daily. 60 tablet 6   No facility-administered medications prior to visit.    No Known Allergies  Review of Systems  Constitutional:   Negative for appetite change, chills, fatigue and fever.  HENT:  Negative for congestion, dental problem, ear pain and sore throat.   Eyes:  Negative for discharge, redness and visual disturbance.  Respiratory:  Negative for cough, chest tightness, shortness of breath and wheezing.   Cardiovascular:  Negative for chest pain, palpitations and leg swelling.  Gastrointestinal:  Negative for abdominal pain, blood in stool, diarrhea, nausea and vomiting.  Genitourinary:  Negative for difficulty urinating, dysuria, flank pain, frequency, hematuria and urgency.  Musculoskeletal:  Negative for arthralgias, back pain, joint swelling, myalgias and neck stiffness.  Skin:  Negative for pallor and rash.  Neurological:  Negative for dizziness, speech difficulty, weakness and headaches.  Hematological:  Negative for adenopathy. Does not bruise/bleed easily.  Psychiatric/Behavioral:  Negative for confusion and sleep disturbance. The patient is not nervous/anxious.     PE;    08/27/2023   11:21 AM 08/27/2023   11:15 AM 01/22/2023    8:47 AM  Vitals with BMI  Height  5\' 11"    Weight  147 lbs 3 oz 140 lbs 3 oz  BMI  20.54   Systolic 146 154 235  Diastolic 80 80 81  Pulse  99 95   Gen: Alert, well appearing.  Patient is oriented to person, place, time, and situation. AFFECT: pleasant, lucid thought and speech. ENT: Ears: EACs clear, normal epithelium.  TMs with good light reflex and landmarks bilaterally.  Eyes: no injection, icteris, swelling, or exudate.  EOMI, PERRLA. Nose: no drainage or turbinate edema/swelling.  No injection or focal lesion.  Mouth: lips without lesion/swelling.  Oral mucosa pink and moist.  Dentition intact and without obvious caries or gingival swelling.  Oropharynx without erythema, exudate, or swelling.  Neck: supple/nontender.  No LAD, mass, or TM.  Carotid pulses 2+ bilaterally, without bruits. CV: RRR, no m/r/g.   LUNGS: CTA bilat, nonlabored resps, good aeration in all lung  fields. ABD: soft, NT, ND,  BS normal.  No hepatospenomegaly or mass.  No bruits. EXT: no clubbing, cyanosis, or edema.  Musculoskeletal: no joint swelling, erythema, warmth, or tenderness.  ROM of all joints intact. Skin - no sores or suspicious lesions or rashes or color changes  Pertinent labs:  Lab Results  Component Value Date   TSH 1.89 08/21/2019   Lab Results  Component Value Date   WBC 5.0 09/20/2020   HGB 16.0 09/20/2020   HCT 46.0 09/20/2020   MCV 87.8 09/20/2020   PLT 171 09/20/2020   Lab Results  Component Value Date   CREATININE 1.03 01/22/2023   BUN 14 01/22/2023   NA 142 01/22/2023   K 3.9 01/22/2023   CL 102 01/22/2023   CO2 29 01/22/2023   Lab Results  Component Value Date   ALT 16 06/26/2022   AST 14 06/26/2022   ALKPHOS 79 03/23/2021   BILITOT 2.0 (H) 06/26/2022   Lab Results  Component Value Date   CHOL 150 01/22/2023   Lab Results  Component Value Date   HDL 56.90 01/22/2023   Lab Results  Component Value Date   LDLCALC 78 01/22/2023   Lab Results  Component Value Date   TRIG 78.0 01/22/2023   Lab Results  Component Value Date   CHOLHDL 3 01/22/2023   Lab Results  Component Value Date   PSA 0.80 06/26/2022   PSA 0.76 09/20/2020   PSA 0.68 05/08/2007   Lab Results  Component Value Date   HGBA1C 4.2 08/21/2019   Lab Results  Component Value Date   VITAMINB12 683 06/26/2022   ASSESSMENT AND PLAN:   #1 health maintenance exam/Preventative health care:  Vaccines: he has declined pneumovax, Tdap, and shingrix in the past-->continues to decline today. LABS: cbc, lipids,cmet,psa Colon cancer screening: has never had a colonoscopy.  He is still thinking about this and plans on pursuing this at some point but not ready yet. Prostate cancer screening: PSA today. Hep C screening neg 2012.  #2 hypertension, historically well-controlled on HCTZ 25 mg a day.  He was off of this medication for a week or so recently because he ran out  of medications.  He has been back on it for a few days now.  Encouraged him to monitor blood pressure at home over the next week or so and let us know if it is staying greater than 130/80.  #3 hypercholesterolemia, doing well on a atorvastatin 20 mg a day. Lipid panel and hepatic panel today.  4.  Vitamin B12 deficiency.  He has been doing well on 100 mcg daily.  He also takes folic acid 0.4 mg daily. B12 level today.  An After Visit Summary was printed and given to the patient.  FOLLOW UP:  Return in about 6 months (around 02/27/2024) for routine chronic illness f/u.  Signed:  Santiago Bumpers, MD           08/27/2023

## 2023-08-28 ENCOUNTER — Encounter: Payer: Self-pay | Admitting: Family Medicine

## 2023-08-28 LAB — CBC WITH DIFFERENTIAL/PLATELET
Absolute Lymphocytes: 1398 {cells}/uL (ref 850–3900)
Absolute Monocytes: 401 {cells}/uL (ref 200–950)
Basophils Absolute: 59 {cells}/uL (ref 0–200)
Basophils Relative: 1 %
Eosinophils Absolute: 100 {cells}/uL (ref 15–500)
Eosinophils Relative: 1.7 %
HCT: 51.3 % — ABNORMAL HIGH (ref 38.5–50.0)
Hemoglobin: 17.7 g/dL — ABNORMAL HIGH (ref 13.2–17.1)
MCH: 30.9 pg (ref 27.0–33.0)
MCHC: 34.5 g/dL (ref 32.0–36.0)
MCV: 89.7 fL (ref 80.0–100.0)
MPV: 10.3 fL (ref 7.5–12.5)
Monocytes Relative: 6.8 %
Neutro Abs: 3941 {cells}/uL (ref 1500–7800)
Neutrophils Relative %: 66.8 %
Platelets: 180 10*3/uL (ref 140–400)
RBC: 5.72 10*6/uL (ref 4.20–5.80)
RDW: 12.8 % (ref 11.0–15.0)
Total Lymphocyte: 23.7 %
WBC: 5.9 10*3/uL (ref 3.8–10.8)

## 2023-08-28 LAB — COMPREHENSIVE METABOLIC PANEL
AG Ratio: 1.5 (calc) (ref 1.0–2.5)
ALT: 30 U/L (ref 9–46)
AST: 25 U/L (ref 10–35)
Albumin: 4.4 g/dL (ref 3.6–5.1)
Alkaline phosphatase (APISO): 83 U/L (ref 35–144)
BUN: 13 mg/dL (ref 7–25)
CO2: 32 mmol/L (ref 20–32)
Calcium: 9.7 mg/dL (ref 8.6–10.3)
Chloride: 100 mmol/L (ref 98–110)
Creat: 1.1 mg/dL (ref 0.70–1.28)
Globulin: 2.9 g/dL (ref 1.9–3.7)
Glucose, Bld: 109 mg/dL — ABNORMAL HIGH (ref 65–99)
Potassium: 4 mmol/L (ref 3.5–5.3)
Sodium: 141 mmol/L (ref 135–146)
Total Bilirubin: 2.1 mg/dL — ABNORMAL HIGH (ref 0.2–1.2)
Total Protein: 7.3 g/dL (ref 6.1–8.1)

## 2023-08-28 LAB — LIPID PANEL
Cholesterol: 160 mg/dL (ref <200)
HDL: 58 mg/dL (ref >=40)
LDL Cholesterol (Calc): 81 mg/dL
Non-HDL Cholesterol (Calc): 102 mg/dL (ref <130)
Total CHOL/HDL Ratio: 2.8 (calc) (ref <5.0)
Triglycerides: 111 mg/dL (ref <150)

## 2023-08-28 LAB — VITAMIN B12: Vitamin B-12: 1103 pg/mL — ABNORMAL HIGH (ref 200–1100)

## 2023-09-06 ENCOUNTER — Ambulatory Visit: Admitting: Family Medicine

## 2023-09-10 ENCOUNTER — Ambulatory Visit: Payer: Self-pay

## 2023-09-10 NOTE — Telephone Encounter (Signed)
 Copied from CRM [id number]. Topic: Clinical - Lab/Test Results >> Sep 10, 2023  2:03 PM [place] A wrote: Reason for CRM: Patient called regarding lab results, Agent informed pt that his B12 results were abnormal. Patient asked to speak with Nurse  Patient calling in about lab results. I advised the patient of his results. Patient states he does not take an iron supplement. Patient will call back to set up a lab appointment for repeat labs when he is able to. Patient had no further questions or concerns at this time.    Reason for Disposition  Caller requesting routine or non-urgent lab result  Answer Assessment - Initial Assessment Questions 1. REASON FOR CALL or QUESTION: "What is your reason for calling today?" or "How can I best help you?" or "What question do you have that I can help answer?"     Lab results  2. CALLER: Document the source of call. (e.g., laboratory, patient).     Patient  Protocols used: PCP Call - No Triage-A-AH

## 2023-09-10 NOTE — Progress Notes (Signed)
Left pt a vm to discuss.

## 2023-09-10 NOTE — Telephone Encounter (Signed)
 Noted.

## 2023-09-25 ENCOUNTER — Ambulatory Visit (INDEPENDENT_AMBULATORY_CARE_PROVIDER_SITE_OTHER): Admitting: *Deleted

## 2023-09-25 DIAGNOSIS — Z0000 Encounter for general adult medical examination without abnormal findings: Secondary | ICD-10-CM

## 2023-09-25 NOTE — Patient Instructions (Signed)
 Mr. George Marshall , Thank you for taking time to come for your Medicare Wellness Visit. I appreciate your ongoing commitment to your health goals. Please review the following plan we discussed and let me know if I can assist you in the future.   Screening recommendations/referrals: Colonoscopy: Education provided Recommended yearly ophthalmology/optometry visit for glaucoma screening and checkup Recommended yearly dental visit for hygiene and checkup  Vaccinations: Influenza vaccine: up to date Pneumococcal vaccine: Education provided Tdap vaccine: Education provided Shingles vaccine: Education provided     Preventive Care 65 Years and Older, Male Preventive care refers to lifestyle choices and visits with your health care provider that can promote health and wellness. What does preventive care include? A yearly physical exam. This is also called an annual well check. Dental exams once or twice a year. Routine eye exams. Ask your health care provider how often you should have your eyes checked. Personal lifestyle choices, including: Daily care of your teeth and gums. Regular physical activity. Eating a healthy diet. Avoiding tobacco and drug use. Limiting alcohol use. Practicing safe sex. Taking low doses of aspirin every day. Taking vitamin and mineral supplements as recommended by your health care provider. What happens during an annual well check? The services and screenings done by your health care provider during your annual well check will depend on your age, overall health, lifestyle risk factors, and family history of disease. Counseling  Your health care provider may ask you questions about your: Alcohol use. Tobacco use. Drug use. Emotional well-being. Home and relationship well-being. Sexual activity. Eating habits. History of falls. Memory and ability to understand (cognition). Work and work Astronomer. Screening  You may have the following tests or  measurements: Height, weight, and BMI. Blood pressure. Lipid and cholesterol levels. These may be checked every 5 years, or more frequently if you are over 81 years old. Skin check. Lung cancer screening. You may have this screening every year starting at age 44 if you have a 30-pack-year history of smoking and currently smoke or have quit within the past 15 years. Fecal occult blood test (FOBT) of the stool. You may have this test every year starting at age 53. Flexible sigmoidoscopy or colonoscopy. You may have a sigmoidoscopy every 5 years or a colonoscopy every 10 years starting at age 71. Prostate cancer screening. Recommendations will vary depending on your family history and other risks. Hepatitis C blood test. Hepatitis B blood test. Sexually transmitted disease (STD) testing. Diabetes screening. This is done by checking your blood sugar (glucose) after you have not eaten for a while (fasting). You may have this done every 1-3 years. Abdominal aortic aneurysm (AAA) screening. You may need this if you are a current or former smoker. Osteoporosis. You may be screened starting at age 49 if you are at high risk. Talk with your health care provider about your test results, treatment options, and if necessary, the need for more tests. Vaccines  Your health care provider may recommend certain vaccines, such as: Influenza vaccine. This is recommended every year. Tetanus, diphtheria, and acellular pertussis (Tdap, Td) vaccine. You may need a Td booster every 10 years. Zoster vaccine. You may need this after age 5. Pneumococcal 13-valent conjugate (PCV13) vaccine. One dose is recommended after age 49. Pneumococcal polysaccharide (PPSV23) vaccine. One dose is recommended after age 54. Talk to your health care provider about which screenings and vaccines you need and how often you need them. This information is not intended to replace advice given to  you by your health care provider. Make sure  you discuss any questions you have with your health care provider. Document Released: 07/01/2015 Document Revised: 02/22/2016 Document Reviewed: 04/05/2015 Elsevier Interactive Patient Education  2017 [employer].  Fall Prevention in the Home Falls can cause injuries. They can happen to people of all ages. There are many things you can do to make your home safe and to help prevent falls. What can I do on the outside of my home? Regularly fix the edges of walkways and driveways and fix any cracks. Remove anything that might make you trip as you walk through a door, such as a raised step or threshold. Trim any bushes or trees on the path to your home. Use bright outdoor lighting. Clear any walking paths of anything that might make someone trip, such as rocks or tools. Regularly check to see if handrails are loose or broken. Make sure that both sides of any steps have handrails. Any raised decks and porches should have guardrails on the edges. Have any leaves, snow, or ice cleared regularly. Use sand or salt on walking paths during winter. Clean up any spills in your garage right away. This includes oil or grease spills. What can I do in the bathroom? Use night lights. Install grab bars by the toilet and in the tub and shower. Do not use towel bars as grab bars. Use non-skid mats or decals in the tub or shower. If you need to sit down in the shower, use a plastic, non-slip stool. Keep the floor dry. Clean up any water that spills on the floor as soon as it happens. Remove soap buildup in the tub or shower regularly. Attach bath mats securely with double-sided non-slip rug tape. Do not have throw rugs and other things on the floor that can make you trip. What can I do in the bedroom? Use night lights. Make sure that you have a light by your bed that is easy to reach. Do not use any sheets or blankets that are too big for your bed. They should not hang down onto the floor. Have a firm  chair that has side arms. You can use this for support while you get dressed. Do not have throw rugs and other things on the floor that can make you trip. What can I do in the kitchen? Clean up any spills right away. Avoid walking on wet floors. Keep items that you use a lot in easy-to-reach places. If you need to reach something above you, use a strong step stool that has a grab bar. Keep electrical cords out of the way. Do not use floor polish or wax that makes floors slippery. If you must use wax, use non-skid floor wax. Do not have throw rugs and other things on the floor that can make you trip. What can I do with my stairs? Do not leave any items on the stairs. Make sure that there are handrails on both sides of the stairs and use them. Fix handrails that are broken or loose. Make sure that handrails are as long as the stairways. Check any carpeting to make sure that it is firmly attached to the stairs. Fix any carpet that is loose or worn. Avoid having throw rugs at the top or bottom of the stairs. If you do have throw rugs, attach them to the floor with carpet tape. Make sure that you have a light switch at the top of the stairs and the bottom of the stairs.  If you do not have them, ask someone to add them for you. What else can I do to help prevent falls? Wear shoes that: Do not have high heels. Have rubber bottoms. Are comfortable and fit you well. Are closed at the toe. Do not wear sandals. If you use a stepladder: Make sure that it is fully opened. Do not climb a closed stepladder. Make sure that both sides of the stepladder are locked into place. Ask someone to hold it for you, if possible. Clearly mark and make sure that you can see: Any grab bars or handrails. First and last steps. Where the edge of each step is. Use tools that help you move around (mobility aids) if they are needed. These include: Canes. Walkers. Scooters. Crutches. Turn on the lights when you go  into a dark area. Replace any light bulbs as soon as they burn out. Set up your furniture so you have a clear path. Avoid moving your furniture around. If any of your floors are uneven, fix them. If there are any pets around you, be aware of where they are. Review your medicines with your doctor. Some medicines can make you feel dizzy. This can increase your chance of falling. Ask your doctor what other things that you can do to help prevent falls. This information is not intended to replace advice given to you by your health care provider. Make sure you discuss any questions you have with your health care provider. Document Released: 03/31/2009 Document Revised: 11/10/2015 Document Reviewed: 07/09/2014 Elsevier Interactive Patient Education  2017 [employer].

## 2023-09-25 NOTE — Progress Notes (Signed)
 Subjective:   George Marshall is a 73 y.o. male who presents for Medicare Annual/Subsequent preventive examination.  Visit Complete: Virtual I connected with  George Marshall on 09/25/23 by a audio enabled telemedicine application and verified that I am speaking with the correct person using two identifiers.  Patient Location: Home  Provider Location: Home Office  I discussed the limitations of evaluation and management by telemedicine. The patient expressed understanding and agreed to proceed.  Vital Signs: Because this visit was a virtual/telehealth visit, some criteria may be missing or patient reported. Any vitals not documented were not able to be obtained and vitals that have been documented are patient reported.   Cardiac Risk Factors include: advanced age (>27men, >48 women);male gender     Objective:    There were no vitals filed for this visit. There is no height or weight on file to calculate BMI.     09/25/2023    1:51 PM 09/05/2022   10:58 AM 08/30/2021   11:02 AM 08/24/2020    9:48 AM 01/07/2019   12:44 PM 07/28/2015    1:36 PM 08/23/2014   11:00 AM  Advanced Directives  Does Patient Have a Medical Advance Directive? Yes Yes Yes Yes Yes No No  Type of Advance Directive Living will Healthcare Power of George Marshall;Living will Healthcare Power of George Marshall;Living will Healthcare Power of George Marshall;Living will     Does patient want to make changes to medical advance directive?     No - Patient declined    Copy of Healthcare Power of Attorney in Chart?  No - copy requested No - copy requested No - copy requested     Would patient like information on creating a medical advance directive?       No - patient declined information    Current Medications (verified) Outpatient Encounter Medications as of 09/25/2023  Medication Sig   Ascorbic Acid (VITAMIN C) 500 MG CHEW 1 tablet   atorvastatin (LIPITOR) 20 MG tablet TAKE 1 TABLET BY MOUTH EVERYDAY AT BEDTIME.   Folic Acid (FOLATE PO)  Take 0.4 mg by mouth in the morning and at bedtime.   hydrochlorothiazide (HYDRODIURIL) 25 MG tablet Take 1 tablet (25 mg total) by mouth daily.   HYRIMOZ 40 MG/0.4ML SOAJ Inject 0.8 mLs into the skin every 14 (fourteen) days.   potassium chloride SA (KLOR-CON M) 20 MEQ tablet Take 1 tablet (20 mEq total) by mouth 2 (two) times daily.   vitamin B-12 (CYANOCOBALAMIN) 100 MCG tablet Take 1 tablet (100 mcg total) by mouth daily.   No facility-administered encounter medications on file as of 09/25/2023.    Allergies (verified) Patient has no known allergies.   History: Past Medical History:  Diagnosis Date   B12 deficiency 11/08/2010   Choledocholithiasis with acute cholangitis s/p ERCP 09/08/2013 09/12/2013   Dr Hinda Lenis GI    Closed displaced fracture of shaft of fifth metacarpal bone of right hand 07/2014   Reduced/splinted by Sports Med MD.   COPD (chronic obstructive pulmonary disease) (HCC)    Dr. Vassie Loll (as of 07/2015 FEV1 stable at 52%)  F/u spirometry stable 06/2016 with Dr. Vassie Loll.  Spiriva trial in the past--no help.  Pt declines inhaler meds, functions fine w/out COPD meds as of 06/2018.   Folate deficiency 07/2019   paresthesias->folic acid supplement started-->paresthesias resolved.   Fracture of fifth toe, right, closed 09/26/2012   Fracture of great toe, right, closed 09/2012   Nondisplaced.  Buddy taped, post-op shoe, ortho eval  was done s/p initial dx in urgent care center.   GERD (gastroesophageal reflux disease)    [name] Lone syndrome    History of syncope 12/2004   Hyperlipidemia    recommended statin 09/2020   Microhematuria    2024 (x1 on out of [place] UA in [place].  Rpt here no blood.  renal u/s nl 01/2023.  Plan rpt UA w/micro approx 04/2023.   Pneumonia 10/20/2010   Initial dx 10/17/10--CXR and CT scan. F/u CT 11/06/10 showed resolving RUL and RML pneumonia and resolving reactive mediastinal LAD.     Rheumatoid arthritis(714.0) 10/2010   RF+, CCP+; managed well with  Humira as of 06/2018 (Dr. Dierdre Forth)   White coat syndrome without hypertension 2018   Reconfirmed 08/2019 with normal home bps   Past Surgical History:  Procedure Laterality Date   AORTIC ULTRASOUND  09/2020   Medicare screening-->NEG for aneurism.  (+prox aortic ectasia).   CHOLECYSTECTOMY N/A 09/10/2013   Procedure: LAPAROSCOPIC CHOLECYSTECTOMY, OPEN CHOLECYSTECTOMY WITH INTRAOPERATIVE CHOLANGIOGRAM;  Surgeon: Robyne Askew, MD;  Location: [facility];  Service: General;  Laterality: N/A;   ERCP N/A 09/08/2013   Procedure: ENDOSCOPIC RETROGRADE CHOLANGIOPANCREATOGRAPHY (ERCP);  Surgeon: Willis Modena, MD;  Location: [facility];  Service: Endoscopy;  Laterality: N/A;   INGUINAL HERNIA REPAIR     age 65   Family History  Problem Relation Age of Onset   Breast cancer Mother 75   Ovarian cancer Mother 56   Skin cancer Mother    Pneumonia Father    Breast cancer Maternal Aunt        dx in her 30s   Breast cancer Maternal Aunt 25   Breast cancer Maternal Aunt 68   Prostate cancer Cousin        double first cousin   Hypertension Other        parents late 71's   Breast cancer Paternal Aunt    Social History   Socioeconomic History   Marital status: Divorced    Spouse name: Not on file   Number of children: Not on file   Years of education: Not on file   Highest education level: Not on file  Occupational History   Occupation: retired  Tobacco Use   Smoking status: Former    Current packs/day: 0.00    Average packs/day: 1.5 packs/day for 30.0 years (45.0 ttl pk-yrs)    Types: Cigarettes    Start date: 06/18/1968    Quit date: 06/18/1998    Years since quitting: 25.2   Smokeless tobacco: Current    Types: Chew  Vaping Use   Vaping status: Never Used  Substance and Sexual Activity   Alcohol use: No   Drug use: No   Sexual activity: Not on file  Other Topics Concern   Not on file  Social History Narrative   Divorced, 2 step children.   Has live-in girlfriend.   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    native, orig from [place], [place].   Tob: 30 pack-yr hx.  Quit 2000.   No alcohol.  No drugs.   Volunteer with [employer].   Gardening/yard work.   Social Drivers of Corporate investment banker Strain: Low Risk  (09/25/2023)   Overall Financial Resource Strain (CARDIA)    Difficulty of Paying Living Expenses: Not hard at all  Food Insecurity: No Food Insecurity (09/25/2023)   Hunger Vital Sign    Worried About Running Out of Food in the  Last Year: Never true    Ran Out of Food in the Last Year: Never true  Transportation Needs: No Transportation Needs (09/05/2022)   PRAPARE - Administrator, Civil Service (Medical): No    Lack of Transportation (Non-Medical): No  Physical Activity: Sufficiently Active (09/25/2023)   Exercise Vital Sign    Days of Exercise per Week: 3 days    Minutes of Exercise per Session: 60 min  Stress: No Stress Concern Present (09/25/2023)   [employer] of Occupational Health - Occupational Stress Questionnaire    Feeling of Stress : Not at all  Social Connections: Moderately Isolated (09/25/2023)   Social Connection and Isolation Panel [NHANES]    Frequency of Communication with Friends and Family: More than three times a week    Frequency of Social Gatherings with Friends and Family: Twice a week    Attends Religious Services: More than 4 times per year    Active Member of [employer] or Organizations: No    Attends Engineer, structural: Never    Marital Status: Divorced    Tobacco Counseling Ready to quit: Not Answered Counseling given: Not Answered   Clinical Intake:  Pre-visit preparation completed: Yes  Pain : No/denies pain     Diabetes: No  How often do you need to have someone help you when you read instructions, pamphlets, or other written materials from your doctor or pharmacy?: 1 - Never  Interpreter Needed?: No  Information entered by :: Remi Haggard  LPN   Activities of Daily Living    09/25/2023    1:59 PM  In your present state of health, do you have any difficulty performing the following activities:  Hearing? 0  Vision? 0  Difficulty concentrating or making decisions? 0  Walking or climbing stairs? 0  Dressing or bathing? 0  Doing errands, shopping? 0  Preparing Food and eating ? N  Using the Toilet? N  In the past six months, have you accidently leaked urine? N  Do you have problems with loss of bowel control? N  Managing your Medications? N  Managing your Finances? N  Housekeeping or managing your Housekeeping? N    Patient Care Team: Jeoffrey Massed, MD as PCP - General (Family Medicine) Griselda Miner, MD as Consulting Physician (General Surgery) Pierce Crane, MD (Inactive) as Consulting Physician (Hematology and Oncology) Willis Modena, MD as Consulting Physician (Gastroenterology) Monica Becton, MD as Consulting Physician (Sports Medicine) Donnetta Hail, MD as Consulting Physician (Rheumatology) Oretha Milch, MD as Consulting Physician (Pulmonary Disease)  Indicate any recent Medical Services you may have received from other than [facility] providers in the past year (date may be approximate).     Assessment:   This is a routine wellness examination for George Marshall.  Hearing/Vision screen Hearing Screening - Comments:: No trouble hearing Vision Screening - Comments:: Up to date [facility]   Goals Addressed             This Visit's Progress    none at this time   On track    Patient Stated   On track    Maintain healthy active lifestyle     Patient Stated       Stay  healthy       Depression Screen    09/25/2023    1:54 PM 08/27/2023   11:18 AM 01/22/2023    8:50 AM 09/05/2022   10:57 AM 06/26/2022   10:30 AM 08/30/2021  11:01 AM 03/23/2021    9:33 AM  PHQ 2/9 Scores  PHQ - 2 Score 0 0 0 0 0 0 0  PHQ- 9 Score 0          Fall Risk    09/25/2023    1:49 PM 08/27/2023   11:18 AM  01/22/2023    8:50 AM 09/05/2022   11:00 AM 06/26/2022   10:30 AM  Fall Risk   Falls in the past year? 0 0 0 0 0  Number falls in past yr: 0 0  0 0  Injury with Fall? 0 0  0 0  Risk for fall due to :  No Fall Risks No Fall Risks Impaired vision No Fall Risks  Follow up Falls evaluation completed;Education provided;Falls prevention discussed Falls evaluation completed Falls evaluation completed Falls prevention discussed     MEDICARE RISK AT HOME: Medicare Risk at Home Any stairs in or around the home?: No If so, are there any without handrails?: No Home free of loose throw rugs in walkways, pet beds, electrical cords, etc?: Yes Adequate lighting in your home to reduce risk of falls?: Yes Life alert?: No Use of a cane, walker or w/c?: No Grab bars in the bathroom?: No Shower chair or bench in shower?: No Elevated toilet seat or a handicapped toilet?: Yes  TIMED UP AND GO:  Was the test performed?  No    Cognitive Function:        09/25/2023    1:51 PM 09/05/2022   10:59 AM 08/30/2021   11:04 AM  6CIT Screen  What Year? 0 points 0 points 0 points  What month? 0 points 0 points 0 points  What time? 0 points 0 points 0 points  Count back from 20 0 points 0 points 0 points  Months in reverse 0 points 0 points 0 points  Repeat phrase 0 points 0 points 0 points  Total Score 0 points 0 points 0 points    Immunizations Immunization History  Administered Date(s) Administered   Fluad Quad(high Dose 65+) 05/27/2019, 03/22/2020, 03/23/2021, 06/26/2022   Influenza Split 04/11/2016   Influenza, High Dose Seasonal PF 05/29/2018   Influenza,inj,Quad PF,6+ Mos 04/02/2013, 05/16/2017   Influenza-Unspecified 04/14/2014, 04/07/2015, 03/05/2023   Moderna Sars-Covid-2 Vaccination 07/20/2019, 08/17/2019, 05/25/2020    TDAP status: Due, Education has been provided regarding the importance of this vaccine. Advised may receive this vaccine at local pharmacy or Health Dept. Aware to provide a  copy of the vaccination record if obtained from local pharmacy or Health Dept. Verbalized acceptance and understanding.  Flu Vaccine status: Up to date  Pneumococcal vaccine status: Due, Education has been provided regarding the importance of this vaccine. Advised may receive this vaccine at local pharmacy or Health Dept. Aware to provide a copy of the vaccination record if obtained from local pharmacy or Health Dept. Verbalized acceptance and understanding.  Covid-19 vaccine status: Information provided on how to obtain vaccines.   Qualifies for Shingles Vaccine? Yes   Zostavax completed No   Shingrix Completed?: No.    Education has been provided regarding the importance of this vaccine. Patient has been advised to call insurance company to determine out of pocket expense if they have not yet received this vaccine. Advised may also receive vaccine at local pharmacy or Health Dept. Verbalized acceptance and understanding.  Screening Tests Health Maintenance  Topic Date Due   Pneumonia Vaccine 18+ Years old (1 of 2 - PCV) Never done   Zoster Vaccines- Shingrix (  1 of 2) Never done   Colonoscopy  Never done   COVID-19 Vaccine (4 - 2024-25 season) 02/17/2023   INFLUENZA VACCINE  01/17/2024   Medicare Annual Wellness (AWV)  09/24/2024   Hepatitis C Screening  Completed   HPV VACCINES  Aged Out   DTaP/Tdap/Td  Discontinued    Health Maintenance  Health Maintenance Due  Topic Date Due   Pneumonia Vaccine 80+ Years old (1 of 2 - PCV) Never done   Zoster Vaccines- Shingrix (1 of 2) Never done   Colonoscopy  Never done   COVID-19 Vaccine (4 - 2024-25 season) 02/17/2023    Colonoscopy declined  Lung Cancer Screening: (Low Dose CT Chest recommended if Age 20-80 years, 20 pack-year currently smoking OR have quit w/in 15years.) does not qualify.   Lung Cancer Screening Referral:   Additional Screening:  Hepatitis C Screening: does not qualify; Completed 2012  Vision Screening:  Recommended annual ophthalmology exams for early detection of glaucoma and other disorders of the eye. Is the patient up to date with their annual eye exam?  Yes  Who is the provider or what is the name of the office in which the patient attends annual eye exams? [facility] If pt is not established with a provider, would they like to be referred to a provider to establish care? No .   Dental Screening: Recommended annual dental exams for proper oral hygiene    Community Resource Referral / Chronic Care Management: CRR required this visit?  No   CCM required this visit?  No     Plan:     I have personally reviewed and noted the following in the patient's chart:   Medical and social history Use of alcohol, tobacco or illicit drugs  Current medications and supplements including opioid prescriptions. Patient is not currently taking opioid prescriptions. Functional ability and status Nutritional status Physical activity Advanced directives List of other physicians Hospitalizations, surgeries, and ER visits in previous 12 months Vitals Screenings to include cognitive, depression, and falls Referrals and appointments  In addition, I have reviewed and discussed with patient certain preventive protocols, quality metrics, and best practice recommendations. A written personalized care plan for preventive services as well as general preventive health recommendations were provided to patient.     Remi Haggard, LPN   06/23/1094   After Visit Summary: (MyChart) Due to this being a telephonic visit, the after visit summary with patients personalized plan was offered to patient via MyChart   Nurse Notes:

## 2024-02-04 DIAGNOSIS — M0589 Other rheumatoid arthritis with rheumatoid factor of multiple sites: Secondary | ICD-10-CM | POA: Diagnosis not present

## 2024-02-04 DIAGNOSIS — Z6820 Body mass index (BMI) 20.0-20.9, adult: Secondary | ICD-10-CM | POA: Diagnosis not present

## 2024-02-04 DIAGNOSIS — Z79899 Other long term (current) drug therapy: Secondary | ICD-10-CM | POA: Diagnosis not present

## 2024-02-04 DIAGNOSIS — R5382 Chronic fatigue, unspecified: Secondary | ICD-10-CM | POA: Diagnosis not present

## 2024-02-25 ENCOUNTER — Ambulatory Visit (INDEPENDENT_AMBULATORY_CARE_PROVIDER_SITE_OTHER): Admitting: Family Medicine

## 2024-02-25 ENCOUNTER — Encounter: Payer: Self-pay | Admitting: Family Medicine

## 2024-02-25 VITALS — BP 144/70 | HR 86 | Temp 97.8°F | Ht 71.0 in | Wt 142.0 lb

## 2024-02-25 DIAGNOSIS — E538 Deficiency of other specified B group vitamins: Secondary | ICD-10-CM | POA: Diagnosis not present

## 2024-02-25 DIAGNOSIS — I10 Essential (primary) hypertension: Secondary | ICD-10-CM | POA: Diagnosis not present

## 2024-02-25 DIAGNOSIS — E7800 Pure hypercholesterolemia, unspecified: Secondary | ICD-10-CM | POA: Diagnosis not present

## 2024-02-25 DIAGNOSIS — D751 Secondary polycythemia: Secondary | ICD-10-CM

## 2024-02-25 MED ORDER — AMLODIPINE BESYLATE 5 MG PO TABS: 5.0000 mg | ORAL_TABLET | Freq: Every day | ORAL | 0 refills | Status: DC

## 2024-02-25 NOTE — Progress Notes (Signed)
 OFFICE VISIT  02/25/2024  CC:  Chief Complaint  Patient presents with   Medical Management of Chronic Issues    Pt is fasting    Patient is a 73 y.o. male who presents for 59-month follow-up hypertension and hypercholesterolemia.  INTERIM HX: George Marshall is well. Home blood pressure average 140 over 70s.  He goes to the gym 3 days a week ([employer] in [place]).  He typically takes his folic acid  only once a day.  He does not take iron supplement.  ROS as above, plus--> no fevers, no CP, no SOB, no wheezing, no cough, no dizziness, no HAs, no rashes, no melena/hematochezia.  No polyuria or polydipsia.  No myalgias or arthralgias.  No focal weakness, paresthesias, or tremors.  No acute vision or hearing abnormalities.  No dysuria or unusual/new urinary urgency or frequency.  No recent changes in lower legs. No n/v/d or abd pain.  No palpitations.    Past Medical History:  Diagnosis Date   B12 deficiency 11/08/2010   Choledocholithiasis with acute cholangitis s/p ERCP 09/08/2013 09/12/2013   Dr Burnette [facility]    Closed displaced fracture of shaft of fifth metacarpal bone of right hand 07/2014   Reduced/splinted by Sports Med MD.   COPD (chronic obstructive pulmonary disease) (HCC)    Dr. Jude (as of 07/2015 FEV1 stable at 52%)  F/u spirometry stable 06/2016 with Dr. Jude.  Spiriva  trial in the past--no help.  Pt declines inhaler meds, functions fine w/out COPD meds as of 06/2018.   Folate deficiency 07/2019   paresthesias->folic acid  supplement started-->paresthesias resolved.   Fracture of fifth toe, right, closed 09/26/2012   Fracture of great toe, right, closed 09/2012   Nondisplaced.  Buddy taped, post-op shoe, ortho eval was done s/p initial dx in urgent care center.   GERD (gastroesophageal reflux disease)    Bertrum syndrome    History of syncope 12/2004   Hyperlipidemia    recommended statin 09/2020   Microhematuria    20[place] U[place] in [employer].   Rpt here no blood.  renal u/s nl 01/2023.  Plan rpt UA w/micro approx 04/2023.   Pneumonia 10/20/2010   Initial dx 10/17/10--CXR and CT scan. F/u CT 11/06/10 showed resolving RUL and RML pneumonia and resolving reactive mediastinal LAD.     Rheumatoid arthritis(714.0) 10/2010   RF+, CCP+; managed well with Humira  as of 06/2018 (Dr. Mai)   White coat syndrome without hypertension 2018   Reconfirmed 08/2019 with normal home bps    Past Surgical History:  Procedure Laterality Date   AORTIC ULTRASOUND  09/2020   Medicare screening-->NEG for aneurism.  (+prox aortic ectasia).   CHOLECYSTECTOMY N/A 09/10/2013   Procedure: LAPAROSCOPIC CHOLECYSTECTOMY, OPEN CHOLECYSTECTOMY WITH INTRAOPERATIVE CHOLANGIOGRAM;  Surgeon: Deward GORMAN Curvin DOUGLAS, MD;  Location: [facility];  Service: General;  Laterality: N/A;   ERCP N/A 09/08/2013   Procedure: ENDOSCOPIC RETROGRADE CHOLANGIOPANCREATOGRAPHY (ERCP);  Surgeon: Elsie Burnette, MD;  [facility];  Service: Endoscopy;  Laterality: N/A;   INGUINAL HERNIA REPAIR     age 32    Outpatient Medications Prior to Visit  Medication Sig Dispense Refill   Ascorbic Acid (VITAMIN C) 500 MG CHEW 1 tablet     atorvastatin  (LIPITOR) 20 MG tablet TAKE 1 TABLET BY MOUTH EVERYDAY AT BEDTIME. 90 tablet 3   Folic Acid  (FOLATE PO) Take 0.4 mg by mouth in the morning and at bedtime.     hydrochlorothiazide  (HYDRODIURIL ) 25 MG tablet Take  1 tablet (25 mg total) by mouth daily. 90 tablet 3   HYRIMOZ  40 MG/0.4ML SOAJ Inject 0.8 mLs into the skin every 14 (fourteen) days.     potassium chloride  SA (KLOR-CON  M) 20 MEQ tablet Take 1 tablet (20 mEq total) by mouth 2 (two) times daily. 180 tablet 3   vitamin B-12 (CYANOCOBALAMIN ) 100 MCG tablet Take 1 tablet (100 mcg total) by mouth daily. 30 tablet 0   No facility-administered medications prior to visit.    No Known Allergies  Review of Systems As per HPI  PE:    02/25/2024   10:10 AM 02/25/2024    9:59 AM 08/27/2023   11:21 AM   Vitals with BMI  Height  5' 11   Weight  142 lbs   BMI  19.81   Systolic 144 158 853  Diastolic 70 77 80  Pulse  86      Physical Exam  Gen: Alert, well appearing.  Patient is oriented to person, place, time, and situation. AFFECT: pleasant, lucid thought and speech. CV: RRR, no m/r/g.   LUNGS: CTA bilat, nonlabored resps, good aeration in all lung fields. EXT: no clubbing or cyanosis.  no edema.    LABS:  Last CBC Lab Results  Component Value Date   WBC 5.9 08/27/2023   HGB 17.7 (H) 08/27/2023   HCT 51.3 (H) 08/27/2023   MCV 89.7 08/27/2023   MCH 30.9 08/27/2023   RDW 12.8 08/27/2023   PLT 180 08/27/2023   Lab Results  Component Value Date   IRON 33 (L) 03/27/2011   TIBC 128 (L) 10/27/2010   FERRITIN 149.6 03/27/2011   Last metabolic panel Lab Results  Component Value Date   GLUCOSE 109 (H) 08/27/2023   NA 141 08/27/2023   K 4.0 08/27/2023   CL 100 08/27/2023   CO2 32 08/27/2023   BUN 13 08/27/2023   CREATININE 1.10 08/27/2023   GFR 73.96 01/22/2023   CALCIUM  9.7 08/27/2023   PROT 7.3 08/27/2023   ALBUMIN 4.1 03/23/2021   BILITOT 2.1 (H) 08/27/2023   ALKPHOS 79 03/23/2021   AST 25 08/27/2023   ALT 30 08/27/2023   ANIONGAP 10 01/07/2019   Last lipids Lab Results  Component Value Date   CHOL 160 08/27/2023   HDL 58 08/27/2023   LDLCALC 81 08/27/2023   LDLDIRECT 191.5 05/17/2006   TRIG 111 08/27/2023   CHOLHDL 2.8 08/27/2023   Last hemoglobin A1c Lab Results  Component Value Date   HGBA1C 4.2 08/21/2019   Last thyroid functions Lab Results  Component Value Date   TSH 1.89 08/21/2019   Last vitamin B12 and Folate Lab Results  Component Value Date   VITAMINB12 1,103 (H) 08/27/2023   FOLATE 21.7 06/26/2022   IMPRESSION AND PLAN:  #1 hypertension, poor control. Add amlodipine  5 mg a day.  Continue HCTZ 25 mg a day. He also takes 20 mEq of potassium twice a day. Electrolytes and creatinine monitoring today. Monitor blood pressure  and heart rate at home regularly and we will review these in 3 to 4 weeks.  2.  Hypercholesterolemia, doing well long-term on the atorvastatin  20 mg a day. Fasting lipid panel and hepatic panel today.  3.  Erythrocytosis. Hemoglobin 17.7 at last visit 6 months ago. Repeat CBC today and check iron and B12 and folate as well.  #4 vitamin B12 deficiency. Last level 6 months ago was just a little bit high.  He takes a 100 mcg tab daily. Recheck level today.  #  5 history of folate deficiency. Check folate today. He has been taking his folic acid  supplement 0.4 mg once a day.  An After Visit Summary was printed and given to the patient.  FOLLOW UP: Return for 3-4 wks f/u HTN.  Signed:  Gerlene Hockey, MD           02/25/2024

## 2024-02-26 LAB — COMPREHENSIVE METABOLIC PANEL WITH GFR
AG Ratio: 1.3 (calc) (ref 1.0–2.5)
ALT: 23 U/L (ref 9–46)
AST: 24 U/L (ref 10–35)
Albumin: 4.1 g/dL (ref 3.6–5.1)
Alkaline phosphatase (APISO): 88 U/L (ref 35–144)
BUN: 16 mg/dL (ref 7–25)
CO2: 36 mmol/L — ABNORMAL HIGH (ref 20–32)
Calcium: 9.4 mg/dL (ref 8.6–10.3)
Chloride: 99 mmol/L (ref 98–110)
Creat: 0.93 mg/dL (ref 0.70–1.28)
Globulin: 3.2 g/dL (ref 1.9–3.7)
Glucose, Bld: 105 mg/dL — ABNORMAL HIGH (ref 65–99)
Potassium: 3.4 mmol/L — ABNORMAL LOW (ref 3.5–5.3)
Sodium: 142 mmol/L (ref 135–146)
Total Bilirubin: 2.2 mg/dL — ABNORMAL HIGH (ref 0.2–1.2)
Total Protein: 7.3 g/dL (ref 6.1–8.1)
eGFR: 88 mL/min/1.73m2 (ref >=60)

## 2024-02-26 LAB — LIPID PANEL
Cholesterol: 152 mg/dL (ref <200)
HDL: 52 mg/dL (ref >=40)
LDL Cholesterol (Calc): 79 mg/dL
Non-HDL Cholesterol (Calc): 100 mg/dL (ref <130)
Total CHOL/HDL Ratio: 2.9 (calc) (ref <5.0)
Triglycerides: 118 mg/dL (ref <150)

## 2024-02-26 LAB — CBC WITH DIFFERENTIAL/PLATELET
Absolute Lymphocytes: 1310 {cells}/uL (ref 850–3900)
Absolute Monocytes: 381 {cells}/uL (ref 200–950)
Basophils Absolute: 39 {cells}/uL (ref 0–200)
Basophils Relative: 0.7 %
Eosinophils Absolute: 78 {cells}/uL (ref 15–500)
Eosinophils Relative: 1.4 %
HCT: 49.1 % (ref 38.5–50.0)
Hemoglobin: 16.7 g/dL (ref 13.2–17.1)
MCH: 31.1 pg (ref 27.0–33.0)
MCHC: 34 g/dL (ref 32.0–36.0)
MCV: 91.4 fL (ref 80.0–100.0)
MPV: 9.9 fL (ref 7.5–12.5)
Monocytes Relative: 6.8 %
Neutro Abs: 3791 {cells}/uL (ref 1500–7800)
Neutrophils Relative %: 67.7 %
Platelets: 198 Thousand/uL (ref 140–400)
RBC: 5.37 Million/uL (ref 4.20–5.80)
RDW: 12.7 % (ref 11.0–15.0)
Total Lymphocyte: 23.4 %
WBC: 5.6 Thousand/uL (ref 3.8–10.8)

## 2024-02-26 LAB — FOLATE: Folate: 22.1 ng/mL

## 2024-02-26 LAB — VITAMIN B12: Vitamin B-12: 1333 pg/mL — ABNORMAL HIGH (ref 200–1100)

## 2024-02-26 LAB — IRON,TIBC AND FERRITIN PANEL
%SAT: 39 % (ref 20–48)
Ferritin: 98 ng/mL (ref 24–380)
Iron: 124 ug/dL (ref 50–180)
TIBC: 314 ug/dL (ref 250–425)

## 2024-03-03 ENCOUNTER — Ambulatory Visit: Payer: Self-pay | Admitting: Family Medicine

## 2024-03-03 MED ORDER — POTASSIUM CHLORIDE CRYS ER 20 MEQ PO TBCR: EXTENDED_RELEASE_TABLET | ORAL | 3 refills | Status: AC

## 2024-03-19 ENCOUNTER — Ambulatory Visit (INDEPENDENT_AMBULATORY_CARE_PROVIDER_SITE_OTHER): Admitting: Family Medicine

## 2024-03-19 ENCOUNTER — Encounter: Payer: Self-pay | Admitting: Family Medicine

## 2024-03-19 VITALS — BP 140/76 | HR 98 | Temp 97.7°F | Ht 71.0 in | Wt 144.4 lb

## 2024-03-19 DIAGNOSIS — E876 Hypokalemia: Secondary | ICD-10-CM | POA: Diagnosis not present

## 2024-03-19 DIAGNOSIS — I10 Essential (primary) hypertension: Secondary | ICD-10-CM

## 2024-03-19 MED ORDER — AMLODIPINE BESYLATE 10 MG PO TABS: 10.0000 mg | ORAL_TABLET | Freq: Every day | ORAL | 1 refills | Status: DC

## 2024-03-19 NOTE — Progress Notes (Signed)
 OFFICE VISIT  03/19/2024  CC:  Chief Complaint  Patient presents with   Medical Management of Chronic Issues    3-4 week f/u BP    Patient is a 73 y.o. male who presents for 3-week follow-up uncontrolled hypertension. A/P as of last visit: #1 hypertension, poor control. Add amlodipine  5 mg a day.  Continue HCTZ 25 mg a day. He also takes 20 mEq of potassium twice a day. Electrolytes and creatinine monitoring today. Monitor blood pressure and heart rate at home regularly and we will review these in 3 to 4 weeks.   2.  Hypercholesterolemia, doing well long-term on the atorvastatin  20 mg a day. Fasting lipid panel and hepatic panel today.   3.  Erythrocytosis. Hemoglobin 17.7 at last visit 6 months ago. Repeat CBC today and check iron and B12 and folate as well.   #4 vitamin B12 deficiency. Last level 6 months ago was just a little bit high.  He takes a 100 mcg tab daily. Recheck level today.   #5 history of folate deficiency. Check folate today. He has been taking his folic acid  supplement 0.4 mg once a day.  INTERIM HX: George Marshall feels well. Home blood pressures systolic average 864.  Diastolic average 74. Pulse range 82-94.  No side effects on amlodipine . Last visit labs were all good except his potassium was a little bit low.  We increased his potassium to 2 tabs in the morning and 1 tab in the evening (Klor-Con  20 mEq tabs).  Past Medical History:  Diagnosis Date   B12 deficiency 11/08/2010   Choledocholithiasis with acute cholangitis s/p ERCP 09/08/2013 09/12/2013   Dr Burnette [facility]    Closed displaced fracture of shaft of fifth metacarpal bone of right hand 07/2014   Reduced/splinted by Sports Med MD.   COPD (chronic obstructive pulmonary disease) (HCC)    Dr. Jude (as of 07/2015 FEV1 stable at 52%)  F/u spirometry stable 06/2016 with Dr. Jude.  Spiriva  trial in the past--no help.  Pt declines inhaler meds, functions fine w/out COPD meds as of 06/2018.   Folate  deficiency 07/2019   paresthesias->folic acid  supplement started-->paresthesias resolved.   Fracture of fifth toe, right, closed 09/26/2012   Fracture of great toe, right, closed 09/2012   Nondisplaced.  Buddy taped, post-op shoe, ortho eval was done s/p initial dx in urgent care center.   GERD (gastroesophageal reflux disease)    Bertrum syndrome    History of syncope 12/2004   Hyperlipidemia    recommended statin 09/2020   Microhematuria    2024 (x1 on out of state UA in [employer].  Rpt here no blood.  renal u/s nl 01/2023.  Plan rpt UA w/micro approx 04/2023.   Pneumonia 10/20/2010   Initial dx 10/17/10--CXR and CT scan. F/u CT 11/06/10 showed resolving RUL and RML pneumonia and resolving reactive mediastinal LAD.     Rheumatoid arthritis(714.0) 10/2010   RF+, CCP+; managed well with Humira  as of 06/2018 (Dr. Mai)   White coat syndrome without hypertension 2018   Reconfirmed 08/2019 with normal home bps    Past Surgical History:  Procedure Laterality Date   AORTIC ULTRASOUND  09/2020   Medicare screening-->NEG for aneurism.  (+prox aortic ectasia).   CHOLECYSTECTOMY N/A 09/10/2013   Procedure: LAPAROSCOPIC CHOLECYSTECTOMY, OPEN CHOLECYSTECTOMY WITH INTRAOPERATIVE CHOLANGIOGRAM;  Surgeon: Deward GORMAN Curvin DOUGLAS, MD;  Location: [facility];  Service: General;  Laterality: N/A;   ERCP N/A 09/08/2013   Procedure: ENDOSCOPIC RETROGRADE CHOLANGIOPANCREATOGRAPHY (ERCP);  Surgeon:  Elsie Cree, MD;  Location: [facility] END[facility]COPY;  Service: Endoscopy;  Laterality: N/A;   INGUINAL HERNIA REPAIR     age 76    Outpatient Medications Prior to Visit  Medication Sig Dispense Refill   Ascorbic Acid (VITAMIN C) 500 MG CHEW 1 tablet     atorvastatin  (LIPITOR) 20 MG tablet TAKE 1 TABLET BY MOUTH EVERYDAY AT BEDTIME. 90 tablet 3   Folic Acid  (FOLATE PO) Take 0.4 mg by mouth in the morning and at bedtime.     hydrochlorothiazide  (HYDRODIURIL ) 25 MG tablet Take 1 tablet (25 mg total) by mouth daily. 90 tablet 3    HYRIMOZ  40 MG/0.4ML SOAJ Inject 0.8 mLs into the skin every 14 (fourteen) days.     potassium chloride  SA (KLOR-CON  M) 20 MEQ tablet Take 2 tabs in the morning and 1 in the evening 270 tablet 3   vitamin B-12 (CYANOCOBALAMIN ) 100 MCG tablet Take 1 tablet (100 mcg total) by mouth daily. 30 tablet 0   amLODipine  (NORVASC ) 5 MG tablet Take 1 tablet (5 mg total) by mouth daily. 30 tablet 0   No facility-administered medications prior to visit.    No Known Allergies  Review of Systems As per HPI  PE:    03/19/2024   10:54 AM 03/19/2024   10:35 AM 02/25/2024   10:10 AM  Vitals with BMI  Height  5' 11   Weight  144 lbs 6 oz   BMI  20.15   Systolic 140 157 855  Diastolic 76 74 70  Pulse  98      Physical Exam  Gen: Alert, well appearing.  Patient is oriented to person, place, time, and situation. AFFECT: pleasant, lucid thought and speech. No further exam today  LABS:  Last CBC Lab Results  Component Value Date   WBC 5.6 02/25/2024   HGB 16.7 02/25/2024   HCT 49.1 02/25/2024   MCV 91.4 02/25/2024   MCH 31.1 02/25/2024   RDW 12.7 02/25/2024   PLT 198 02/25/2024   Last metabolic panel Lab Results  Component Value Date   GLUCOSE 105 (H) 02/25/2024   NA 142 02/25/2024   K 3.4 (L) 02/25/2024   CL 99 02/25/2024   CO2 36 (H) 02/25/2024   BUN 16 02/25/2024   CREATININE 0.93 02/25/2024   EGFR 88 02/25/2024   CALCIUM  9.4 02/25/2024   PROT 7.3 02/25/2024   ALBUMIN 4.1 03/23/2021   BILITOT 2.2 (H) 02/25/2024   ALKPHOS 79 03/23/2021   AST 24 02/25/2024   ALT 23 02/25/2024   ANIONGAP 10 01/07/2019   Last lipids Lab Results  Component Value Date   CHOL 152 02/25/2024   HDL 52 02/25/2024   LDLCALC 79 02/25/2024   LDLDIRECT 191.5 05/17/2006   TRIG 118 02/25/2024   CHOLHDL 2.9 02/25/2024   Last hemoglobin A1c Lab Results  Component Value Date   HGBA1C 4.2 08/21/2019   Last thyroid functions Lab Results  Component Value Date   TSH 1.89 08/21/2019   Lab Results   Component Value Date   FOLATE 22.1 02/25/2024   Last vitamin B12 and Folate Lab Results  Component Value Date   VITAMINB12 1,333 (H) 02/25/2024   FOLATE 22.1 02/25/2024   Lab Results  Component Value Date   PSA 1.05 08/27/2023   PSA 0.80 06/26/2022   PSA 0.76 09/20/2020   IMPRESSION AND PLAN:  #1 hypertension, not ideal control. Increase amlodipine  to 10 mg a day and continue HCTZ 25 mg a day. Potassium was a  little bit low and we increased his supplementation last visit to 40 mill equivalents in the morning and 20 mill equivalents in the evening. Recheck basic metabolic panel today.  An After Visit Summary was printed and given to the patient.  FOLLOW UP: Return in about 4 weeks (around 04/16/2024) for f/u HTN.  Signed:  Gerlene Hockey, MD           03/19/2024

## 2024-03-20 LAB — BASIC METABOLIC PANEL WITH GFR
BUN: 13 mg/dL (ref 7–25)
CO2: 31 mmol/L (ref 20–32)
Calcium: 9.2 mg/dL (ref 8.6–10.3)
Chloride: 101 mmol/L (ref 98–110)
Creat: 0.97 mg/dL (ref 0.70–1.28)
Glucose, Bld: 105 mg/dL — ABNORMAL HIGH (ref 65–99)
Potassium: 3.8 mmol/L (ref 3.5–5.3)
Sodium: 141 mmol/L (ref 135–146)
eGFR: 84 mL/min/1.73m2 (ref >=60)

## 2024-03-26 NOTE — Progress Notes (Signed)
 George Marshall                                          MRN: [id number]   03/26/2024   The [facility] Quality Team Specialist reviewed this patient medical record for the purposes of chart review for care gap closure. The following were reviewed: chart review for care gap closure-controlling blood pressure.    [facility]BCI Quality Team

## 2024-04-14 ENCOUNTER — Other Ambulatory Visit: Payer: Self-pay | Admitting: Family Medicine

## 2024-04-16 ENCOUNTER — Encounter: Payer: Self-pay | Admitting: Family Medicine

## 2024-04-16 ENCOUNTER — Ambulatory Visit (INDEPENDENT_AMBULATORY_CARE_PROVIDER_SITE_OTHER): Admitting: Family Medicine

## 2024-04-16 VITALS — BP 138/72 | HR 107 | Temp 97.1°F | Ht 71.0 in | Wt 146.6 lb

## 2024-04-16 DIAGNOSIS — Z23 Encounter for immunization: Secondary | ICD-10-CM | POA: Diagnosis not present

## 2024-04-16 DIAGNOSIS — I10 Essential (primary) hypertension: Secondary | ICD-10-CM | POA: Diagnosis not present

## 2024-04-16 MED ORDER — AMLODIPINE BESYLATE 10 MG PO TABS: 10.0000 mg | ORAL_TABLET | Freq: Every day | ORAL | 3 refills | Status: AC

## 2024-04-16 NOTE — Progress Notes (Signed)
 OFFICE VISIT  04/16/2024  CC:  Chief Complaint  Patient presents with   Medical Management of Chronic Issues    4 week f/u HTN    Patient is a 73 y.o. male who presents for 4-week follow-up uncontrolled hypertension. A/P as of last visit: 1 hypertension, not ideal control. Increase amlodipine  to 10 mg a day and continue HCTZ 25 mg a day. Potassium was a little bit low and we increased his supplementation last visit to 40 mill equivalents in the morning and 20 mill equivalents in the evening. Recheck basic metabolic panel today.  INTERIM HX: Caeden feels well. No side effects from medications.  Home blood pressures consistently in the 120s over 70s.  Past Medical History:  Diagnosis Date   B12 deficiency 11/08/2010   Choledocholithiasis with acute cholangitis s/p ERCP 09/08/2013 09/12/2013   Dr Burnette Ee GI    Closed displaced fracture of shaft of fifth metacarpal bone of right hand 07/2014   Reduced/splinted by Sports Med MD.   COPD (chronic obstructive pulmonary disease) (HCC)    Dr. Jude (as of 07/2015 FEV1 stable at 52%)  F/u spirometry stable 06/2016 with Dr. Jude.  Spiriva  trial in the past--no help.  Pt declines inhaler meds, functions fine w/out COPD meds as of 06/2018.   Folate deficiency 07/2019   paresthesias->folic acid  supplement started-->paresthesias resolved.   Fracture of fifth toe, right, closed 09/26/2012   Fracture of great toe, right, closed 09/2012   Nondisplaced.  Buddy taped, post-op shoe, ortho eval was done s/p initial dx in urgent care center.   GERD (gastroesophageal reflux disease)    Bertrum syndrome    History of syncope 12/2004   Hyperlipidemia    recommended statin 09/2020   Microhematuria    2024 (x1 on out of [place] UA in [employer].  Rpt here no blood.  renal u/s nl 01/2023.  Plan rpt UA w/micro approx 04/2023.   Pneumonia 10/20/2010   Initial dx 10/17/10--CXR and CT scan. F/u CT 11/06/10 showed resolving RUL and RML pneumonia and resolving  reactive mediastinal LAD.     Rheumatoid arthritis(714.0) 10/2010   RF+, CCP+; managed well with Humira  as of 06/2018 (Dr. Mai)   White coat syndrome without hypertension 2018   Reconfirmed 08/2019 with normal home bps    Past Surgical History:  Procedure Laterality Date   AORTIC ULTRASOUND  09/2020   Medicare screening-->NEG for aneurism.  (+prox aortic ectasia).   CHOLECYSTECTOMY N/A 09/10/2013   Procedure: LAPAROSCOPIC CHOLECYSTECTOMY, OPEN CHOLECYSTECTOMY WITH INTRAOPERATIVE CHOLANGIOGRAM;  Surgeon: Deward GORMAN Curvin DOUGLAS, MD;  Location: [facility];  Service: General;  Laterality: N/A;   ERCP N/A 09/08/2013   Procedure: ENDOSCOPIC RETROGRADE CHOLANGIOPANCREATOGRAPHY (ERCP);  Surgeon: Elsie Burnette, MD;  Location: [facility] ENDOSCOPY;  Service: Endoscopy;  Laterality: N/A;   INGUINAL HERNIA REPAIR     age 38    Outpatient Medications Prior to Visit  Medication Sig Dispense Refill   Ascorbic Acid (VITAMIN C) 500 MG CHEW 1 tablet     atorvastatin  (LIPITOR) 20 MG tablet TAKE 1 TABLET BY MOUTH EVERYDAY AT BEDTIME. 90 tablet 3   Folic Acid  (FOLATE PO) Take 0.4 mg by mouth in the morning and at bedtime.     hydrochlorothiazide  (HYDRODIURIL ) 25 MG tablet Take 1 tablet (25 mg total) by mouth daily. 90 tablet 3   HYRIMOZ  40 MG/0.4ML SOAJ Inject 0.8 mLs into the skin every 14 (fourteen) days.     potassium chloride  SA (KLOR-CON  M) 20 MEQ tablet Take 2 tabs  in the morning and 1 in the evening 270 tablet 3   vitamin B-12 (CYANOCOBALAMIN ) 100 MCG tablet Take 1 tablet (100 mcg total) by mouth daily. 30 tablet 0   amLODipine  (NORVASC ) 10 MG tablet Take 1 tablet (10 mg total) by mouth daily. 30 tablet 1   No facility-administered medications prior to visit.    No Known Allergies  Review of Systems As per HPI  PE:    04/16/2024   10:22 AM 04/16/2024   10:14 AM 03/19/2024   10:54 AM  Vitals with BMI  Height  5' 11   Weight  146 lbs 10 oz   BMI  20.46   Systolic 138 138 859  Diastolic 72 78 76  Pulse   107     Physical Exam  Gen: Alert, well appearing.  Patient is oriented to person, place, time, and situation. No further exam today  LABS:  Last metabolic panel Lab Results  Component Value Date   GLUCOSE 105 (H) 03/19/2024   NA 141 03/19/2024   K 3.8 03/19/2024   CL 101 03/19/2024   CO2 31 03/19/2024   BUN 13 03/19/2024   CREATININE 0.97 03/19/2024   EGFR 84 03/19/2024   CALCIUM  9.2 03/19/2024   PROT 7.3 02/25/2024   ALBUMIN 4.1 03/23/2021   BILITOT 2.2 (H) 02/25/2024   ALKPHOS 79 03/23/2021   AST 24 02/25/2024   ALT 23 02/25/2024   ANIONGAP 10 01/07/2019   IMPRESSION AND PLAN:  Essential hypertension, well-controlled now on Norvasc  10 mg a day and HCTZ 25 mg a day.  An After Visit Summary was printed and given to the patient.  FOLLOW UP: Return in about 6 months (around 10/15/2024) for routine chronic illness f/u.  Signed:  Gerlene Hockey, MD           04/16/2024

## 2024-05-27 DIAGNOSIS — D485 Neoplasm of uncertain behavior of skin: Secondary | ICD-10-CM | POA: Diagnosis not present

## 2024-05-29 NOTE — Progress Notes (Signed)
 George Marshall                                          MRN: [id number]   05/29/2024   The [facility] Quality Team Specialist reviewed this patient medical record for the purposes of chart review for care gap closure. The following were reviewed: abstraction for care gap closure-controlling blood pressure.    [facility]BCI Quality Team

## 2024-07-07 NOTE — Progress Notes (Signed)
 George Marshall                                          MRN: [id number]   07/07/2024   The [facility] Quality Team Specialist reviewed this patient medical record for the purposes of chart review for care gap closure. The following were reviewed: chart review for care gap closure-colorectal cancer screening.    [facility]BCI Quality Team

## 2024-09-30 ENCOUNTER — Encounter
